# Patient Record
Sex: Female | Born: 1950
Health system: Southern US, Community
[De-identification: ages and names within clinical notes are randomized; demographics above are authoritative.]

## PROBLEM LIST (undated history)

## (undated) DIAGNOSIS — J61 Pneumoconiosis due to asbestos and other mineral fibers: Secondary | ICD-10-CM

## (undated) DIAGNOSIS — M069 Rheumatoid arthritis, unspecified: Secondary | ICD-10-CM

## (undated) DIAGNOSIS — Z800 Family history of malignant neoplasm of digestive organs: Secondary | ICD-10-CM

## (undated) DIAGNOSIS — G8929 Other chronic pain: Secondary | ICD-10-CM

## (undated) DIAGNOSIS — Z85118 Personal history of other malignant neoplasm of bronchus and lung: Secondary | ICD-10-CM

## (undated) DIAGNOSIS — I10 Essential (primary) hypertension: Secondary | ICD-10-CM

## (undated) DIAGNOSIS — K219 Gastro-esophageal reflux disease without esophagitis: Secondary | ICD-10-CM

## (undated) DIAGNOSIS — F32A Depression, unspecified: Secondary | ICD-10-CM

## (undated) DIAGNOSIS — Z803 Family history of malignant neoplasm of breast: Secondary | ICD-10-CM

## (undated) DIAGNOSIS — M1990 Unspecified osteoarthritis, unspecified site: Secondary | ICD-10-CM

## (undated) DIAGNOSIS — F329 Major depressive disorder, single episode, unspecified: Secondary | ICD-10-CM

## (undated) HISTORY — PX: TONSILLECTOMY: SUR1361

## (undated) HISTORY — PX: ABDOMINAL HYSTERECTOMY: SHX81

## (undated) HISTORY — DX: Family history of malignant neoplasm of digestive organs: Z80.0

## (undated) HISTORY — DX: Rheumatoid arthritis, unspecified: M06.9

## (undated) HISTORY — DX: Unspecified osteoarthritis, unspecified site: M19.90

## (undated) HISTORY — DX: Essential (primary) hypertension: I10

## (undated) HISTORY — DX: Gastro-esophageal reflux disease without esophagitis: K21.9

## (undated) HISTORY — PX: FEMUR FRACTURE SURGERY: SHX633

## (undated) HISTORY — PX: BILATERAL OOPHORECTOMY: SHX1221

## (undated) HISTORY — PX: OTHER SURGICAL HISTORY: SHX169

## (undated) HISTORY — DX: Personal history of other malignant neoplasm of bronchus and lung: Z85.118

## (undated) HISTORY — PX: KNEE ARTHROPLASTY: SHX992

## (undated) HISTORY — DX: Pneumoconiosis due to asbestos and other mineral fibers: J61

## (undated) HISTORY — DX: Other chronic pain: G89.29

## (undated) HISTORY — DX: Family history of malignant neoplasm of breast: Z80.3

---

## 2003-08-07 DIAGNOSIS — I10 Essential (primary) hypertension: Secondary | ICD-10-CM | POA: Insufficient documentation

## 2003-08-07 DIAGNOSIS — M810 Age-related osteoporosis without current pathological fracture: Secondary | ICD-10-CM | POA: Insufficient documentation

## 2004-07-01 DIAGNOSIS — M069 Rheumatoid arthritis, unspecified: Secondary | ICD-10-CM | POA: Insufficient documentation

## 2006-03-23 DIAGNOSIS — Z9889 Other specified postprocedural states: Secondary | ICD-10-CM | POA: Insufficient documentation

## 2007-08-12 DIAGNOSIS — R911 Solitary pulmonary nodule: Secondary | ICD-10-CM | POA: Insufficient documentation

## 2007-08-12 DIAGNOSIS — E042 Nontoxic multinodular goiter: Secondary | ICD-10-CM | POA: Insufficient documentation

## 2007-08-12 DIAGNOSIS — N289 Disorder of kidney and ureter, unspecified: Secondary | ICD-10-CM | POA: Insufficient documentation

## 2007-08-12 DIAGNOSIS — E785 Hyperlipidemia, unspecified: Secondary | ICD-10-CM | POA: Insufficient documentation

## 2007-08-12 DIAGNOSIS — R7611 Nonspecific reaction to tuberculin skin test without active tuberculosis: Secondary | ICD-10-CM | POA: Insufficient documentation

## 2007-08-12 DIAGNOSIS — K219 Gastro-esophageal reflux disease without esophagitis: Secondary | ICD-10-CM | POA: Insufficient documentation

## 2009-02-28 DIAGNOSIS — Z961 Presence of intraocular lens: Secondary | ICD-10-CM | POA: Insufficient documentation

## 2010-07-26 DIAGNOSIS — Z90710 Acquired absence of both cervix and uterus: Secondary | ICD-10-CM | POA: Insufficient documentation

## 2010-09-18 DIAGNOSIS — Z87891 Personal history of nicotine dependence: Secondary | ICD-10-CM | POA: Insufficient documentation

## 2010-11-09 DIAGNOSIS — R918 Other nonspecific abnormal finding of lung field: Secondary | ICD-10-CM | POA: Insufficient documentation

## 2011-02-26 DIAGNOSIS — N183 Chronic kidney disease, stage 3 unspecified: Secondary | ICD-10-CM | POA: Insufficient documentation

## 2011-03-12 DIAGNOSIS — F339 Major depressive disorder, recurrent, unspecified: Secondary | ICD-10-CM | POA: Insufficient documentation

## 2012-02-04 DIAGNOSIS — R079 Chest pain, unspecified: Secondary | ICD-10-CM | POA: Insufficient documentation

## 2012-02-04 DIAGNOSIS — J449 Chronic obstructive pulmonary disease, unspecified: Secondary | ICD-10-CM | POA: Insufficient documentation

## 2014-02-09 DIAGNOSIS — Z79899 Other long term (current) drug therapy: Secondary | ICD-10-CM | POA: Diagnosis not present

## 2014-03-02 DIAGNOSIS — M170 Bilateral primary osteoarthritis of knee: Secondary | ICD-10-CM | POA: Diagnosis not present

## 2014-03-02 DIAGNOSIS — M25512 Pain in left shoulder: Secondary | ICD-10-CM | POA: Diagnosis not present

## 2014-03-02 DIAGNOSIS — M0579 Rheumatoid arthritis with rheumatoid factor of multiple sites without organ or systems involvement: Secondary | ICD-10-CM | POA: Diagnosis not present

## 2014-03-02 DIAGNOSIS — Z79899 Other long term (current) drug therapy: Secondary | ICD-10-CM | POA: Diagnosis not present

## 2014-04-20 DIAGNOSIS — Z6833 Body mass index (BMI) 33.0-33.9, adult: Secondary | ICD-10-CM | POA: Diagnosis not present

## 2014-04-20 DIAGNOSIS — E6609 Other obesity due to excess calories: Secondary | ICD-10-CM | POA: Diagnosis not present

## 2014-04-20 DIAGNOSIS — M069 Rheumatoid arthritis, unspecified: Secondary | ICD-10-CM | POA: Diagnosis not present

## 2014-04-28 DIAGNOSIS — Z79899 Other long term (current) drug therapy: Secondary | ICD-10-CM | POA: Diagnosis not present

## 2014-04-28 DIAGNOSIS — M0579 Rheumatoid arthritis with rheumatoid factor of multiple sites without organ or systems involvement: Secondary | ICD-10-CM | POA: Diagnosis not present

## 2014-05-11 DIAGNOSIS — E663 Overweight: Secondary | ICD-10-CM | POA: Diagnosis not present

## 2014-05-11 DIAGNOSIS — I10 Essential (primary) hypertension: Secondary | ICD-10-CM | POA: Diagnosis not present

## 2014-05-11 DIAGNOSIS — Z6828 Body mass index (BMI) 28.0-28.9, adult: Secondary | ICD-10-CM | POA: Diagnosis not present

## 2014-07-19 DIAGNOSIS — Z1389 Encounter for screening for other disorder: Secondary | ICD-10-CM | POA: Diagnosis not present

## 2014-07-19 DIAGNOSIS — Z79899 Other long term (current) drug therapy: Secondary | ICD-10-CM | POA: Diagnosis not present

## 2014-07-19 DIAGNOSIS — E663 Overweight: Secondary | ICD-10-CM | POA: Diagnosis not present

## 2014-07-19 DIAGNOSIS — M170 Bilateral primary osteoarthritis of knee: Secondary | ICD-10-CM | POA: Diagnosis not present

## 2014-07-19 DIAGNOSIS — M0579 Rheumatoid arthritis with rheumatoid factor of multiple sites without organ or systems involvement: Secondary | ICD-10-CM | POA: Diagnosis not present

## 2014-07-19 DIAGNOSIS — L850 Acquired ichthyosis: Secondary | ICD-10-CM | POA: Diagnosis not present

## 2014-07-19 DIAGNOSIS — Z6827 Body mass index (BMI) 27.0-27.9, adult: Secondary | ICD-10-CM | POA: Diagnosis not present

## 2014-07-19 DIAGNOSIS — I10 Essential (primary) hypertension: Secondary | ICD-10-CM | POA: Diagnosis not present

## 2014-07-19 DIAGNOSIS — M069 Rheumatoid arthritis, unspecified: Secondary | ICD-10-CM | POA: Diagnosis not present

## 2014-08-03 DIAGNOSIS — M069 Rheumatoid arthritis, unspecified: Secondary | ICD-10-CM | POA: Diagnosis not present

## 2014-08-03 DIAGNOSIS — M25561 Pain in right knee: Secondary | ICD-10-CM | POA: Diagnosis not present

## 2014-08-03 DIAGNOSIS — Z09 Encounter for follow-up examination after completed treatment for conditions other than malignant neoplasm: Secondary | ICD-10-CM | POA: Diagnosis not present

## 2014-08-03 DIAGNOSIS — M25512 Pain in left shoulder: Secondary | ICD-10-CM | POA: Diagnosis not present

## 2014-09-07 DIAGNOSIS — Z6828 Body mass index (BMI) 28.0-28.9, adult: Secondary | ICD-10-CM | POA: Diagnosis not present

## 2014-09-07 DIAGNOSIS — Z1389 Encounter for screening for other disorder: Secondary | ICD-10-CM | POA: Diagnosis not present

## 2014-09-07 DIAGNOSIS — M1612 Unilateral primary osteoarthritis, left hip: Secondary | ICD-10-CM | POA: Diagnosis not present

## 2014-09-07 DIAGNOSIS — F1120 Opioid dependence, uncomplicated: Secondary | ICD-10-CM | POA: Diagnosis not present

## 2014-09-26 DIAGNOSIS — H5210 Myopia, unspecified eye: Secondary | ICD-10-CM | POA: Diagnosis not present

## 2014-09-26 DIAGNOSIS — H5213 Myopia, bilateral: Secondary | ICD-10-CM | POA: Diagnosis not present

## 2014-11-07 DIAGNOSIS — H40013 Open angle with borderline findings, low risk, bilateral: Secondary | ICD-10-CM | POA: Diagnosis not present

## 2014-11-10 DIAGNOSIS — Z23 Encounter for immunization: Secondary | ICD-10-CM | POA: Diagnosis not present

## 2014-12-13 DIAGNOSIS — M25512 Pain in left shoulder: Secondary | ICD-10-CM | POA: Diagnosis not present

## 2014-12-13 DIAGNOSIS — E663 Overweight: Secondary | ICD-10-CM | POA: Diagnosis not present

## 2014-12-13 DIAGNOSIS — J3089 Other allergic rhinitis: Secondary | ICD-10-CM | POA: Diagnosis not present

## 2014-12-13 DIAGNOSIS — Z1389 Encounter for screening for other disorder: Secondary | ICD-10-CM | POA: Diagnosis not present

## 2014-12-13 DIAGNOSIS — Z09 Encounter for follow-up examination after completed treatment for conditions other than malignant neoplasm: Secondary | ICD-10-CM | POA: Diagnosis not present

## 2014-12-13 DIAGNOSIS — M25562 Pain in left knee: Secondary | ICD-10-CM | POA: Diagnosis not present

## 2014-12-13 DIAGNOSIS — Z6827 Body mass index (BMI) 27.0-27.9, adult: Secondary | ICD-10-CM | POA: Diagnosis not present

## 2014-12-13 DIAGNOSIS — Z79899 Other long term (current) drug therapy: Secondary | ICD-10-CM | POA: Diagnosis not present

## 2014-12-13 DIAGNOSIS — M069 Rheumatoid arthritis, unspecified: Secondary | ICD-10-CM | POA: Diagnosis not present

## 2015-01-03 ENCOUNTER — Other Ambulatory Visit: Payer: Self-pay | Admitting: Periodontics

## 2015-01-08 ENCOUNTER — Other Ambulatory Visit: Payer: Self-pay | Admitting: Family Medicine

## 2015-01-08 DIAGNOSIS — R5381 Other malaise: Secondary | ICD-10-CM

## 2015-01-08 DIAGNOSIS — Z1231 Encounter for screening mammogram for malignant neoplasm of breast: Secondary | ICD-10-CM

## 2015-01-29 DIAGNOSIS — Z1389 Encounter for screening for other disorder: Secondary | ICD-10-CM | POA: Diagnosis not present

## 2015-01-29 DIAGNOSIS — I10 Essential (primary) hypertension: Secondary | ICD-10-CM | POA: Diagnosis not present

## 2015-01-29 DIAGNOSIS — E663 Overweight: Secondary | ICD-10-CM | POA: Diagnosis not present

## 2015-01-29 DIAGNOSIS — Z6827 Body mass index (BMI) 27.0-27.9, adult: Secondary | ICD-10-CM | POA: Diagnosis not present

## 2015-01-29 DIAGNOSIS — R42 Dizziness and giddiness: Secondary | ICD-10-CM | POA: Diagnosis not present

## 2015-02-02 DIAGNOSIS — M1712 Unilateral primary osteoarthritis, left knee: Secondary | ICD-10-CM | POA: Diagnosis not present

## 2015-02-02 DIAGNOSIS — Z79899 Other long term (current) drug therapy: Secondary | ICD-10-CM | POA: Diagnosis not present

## 2015-02-02 DIAGNOSIS — M0579 Rheumatoid arthritis with rheumatoid factor of multiple sites without organ or systems involvement: Secondary | ICD-10-CM | POA: Diagnosis not present

## 2015-02-02 DIAGNOSIS — M19041 Primary osteoarthritis, right hand: Secondary | ICD-10-CM | POA: Diagnosis not present

## 2015-02-02 DIAGNOSIS — M170 Bilateral primary osteoarthritis of knee: Secondary | ICD-10-CM | POA: Diagnosis not present

## 2015-02-05 DIAGNOSIS — Z79899 Other long term (current) drug therapy: Secondary | ICD-10-CM | POA: Diagnosis not present

## 2015-02-05 DIAGNOSIS — E559 Vitamin D deficiency, unspecified: Secondary | ICD-10-CM | POA: Diagnosis not present

## 2015-02-05 DIAGNOSIS — R5383 Other fatigue: Secondary | ICD-10-CM | POA: Diagnosis not present

## 2015-02-05 DIAGNOSIS — R42 Dizziness and giddiness: Secondary | ICD-10-CM | POA: Insufficient documentation

## 2015-02-05 DIAGNOSIS — I10 Essential (primary) hypertension: Secondary | ICD-10-CM

## 2015-02-06 ENCOUNTER — Encounter: Payer: Self-pay | Admitting: Cardiology

## 2015-02-06 ENCOUNTER — Ambulatory Visit (INDEPENDENT_AMBULATORY_CARE_PROVIDER_SITE_OTHER): Payer: Commercial Managed Care - HMO | Admitting: Cardiology

## 2015-02-06 VITALS — BP 124/86 | HR 81 | Ht 63.0 in | Wt 195.0 lb

## 2015-02-06 DIAGNOSIS — R0602 Shortness of breath: Secondary | ICD-10-CM

## 2015-02-06 DIAGNOSIS — R42 Dizziness and giddiness: Secondary | ICD-10-CM | POA: Diagnosis not present

## 2015-02-06 DIAGNOSIS — I10 Essential (primary) hypertension: Secondary | ICD-10-CM | POA: Diagnosis not present

## 2015-02-06 DIAGNOSIS — M069 Rheumatoid arthritis, unspecified: Secondary | ICD-10-CM | POA: Diagnosis not present

## 2015-02-06 MED ORDER — LOSARTAN POTASSIUM-HCTZ 50-12.5 MG PO TABS: 1.0000 | ORAL_TABLET | Freq: Every day | ORAL | Status: DC

## 2015-02-06 NOTE — Progress Notes (Signed)
Cardiology Office Note  Date: 02/06/2015   ID: Mardee Postin, DOB [birth year], MRN [id number]  PCP: Purvis Kilts, MD  Consulting Cardiologist: Rozann Lesches, MD   Chief Complaint  Patient presents with  . Evaluate heart function  . Dizziness    History of Present Illness: Rose Lutz is a 65 y.o. female referred for cardiology consultation by Mr. Eston Mould at [facility]. She presents with a history of predominantly orthostatic lightheadedness that has been present intermittently since late December. She states that she usually notices this after she has been standing up for a few minutes, does not describe vertigo or palpitations, has not had frank syncope. She does not recall any change in her diet, fluid intake, or sodium intake. She does states that she has lost about 35 pounds over the last year. Also reports change in Cozaar to Hyzaar about 6 months ago.  She also describes a feeling of general fatigue, no exertional chest pain. She also reports NYHA class II dyspnea. She moved to [place] from [facility] a few years ago. She states that when she lived in [place] she did undergo a cardiac evaluation approximately 3 or 4 years ago with normal stress testing. ECG today shows sinus rhythm with lead artifact, low voltage in the precordial leads, and IVCD.  She reports no major difficulties in controlling her blood pressure over time on medical therapy. Additional medical history reviewed, she has been managed for long term rheumatoid arthritis with mainly joint manifestations. She had her right knee replaced years ago. She also reports a history of "mild" asbestosis and it sounds like she was followed by a pulmonologist at one point in [place]. She states that this is related to prior occupational exposure. I do not have any other details.  She reports chronic pain related to her arthritis and is on hydrocodone, followed by her primary care provider.  She  states that she has had a recent part-time job at [employer], but has taken herself out of work for a limited time due to her recent symptoms.  Past Medical History  Diagnosis Date  . GERD (gastroesophageal reflux disease)   . Essential hypertension   . Rheumatoid arthritis ([place])   . Pulmonary asbestosis [facility])     Patient states mild  . Chronic pain     On hydrocodone    Past Surgical History  Procedure Laterality Date  . Tonsillectomy    . Knee arthroplasty Right   . Abdominal hysterectomy    . Bilateral oophorectomy      Current Outpatient Prescriptions  Medication Sig Dispense Refill  . cetirizine (ZYRTEC) 10 MG tablet Take 10 mg by mouth at bedtime.    . Cholecalciferol (VITAMIN D3) 3000 units TABS Take 5,000 Units by mouth daily.    . folic acid (FOLVITE) 1 MG tablet Take 1 mg by mouth daily.    Marland Kitchen HYDROcodone-acetaminophen (NORCO) 10-325 MG tablet Take 1 tablet by mouth every 6 (six) hours as needed.    . hydroxychloroquine (PLAQUENIL) 200 MG tablet Take by mouth daily.    . methotrexate (RHEUMATREX) 2.5 MG tablet Take 2.5 mg by mouth once a week. Caution:Chemotherapy. Protect from light.    . montelukast (SINGULAIR) 10 MG tablet     . omeprazole (PRILOSEC) 20 MG capsule Take 20 mg by mouth daily.    Marland Kitchen losartan-hydrochlorothiazide (HYZAAR) 50-12.5 MG tablet Take 1 tablet by mouth daily. 90 tablet 3   No current facility-administered medications for this visit.  Allergies:  Quinine derivatives and Sulfa antibiotics   Social History: The patient  reports that she quit smoking about 9 years ago. She started smoking about 47 years ago. She does not have any smokeless tobacco history on file. She reports that she does not drink alcohol or use illicit drugs.   Family History: The patient's family history includes Breast cancer in her mother; Colon cancer in her maternal grandmother; Pancreatic cancer in her son.   ROS:  Please see the history of present illness. Otherwise,  complete review of systems is positive for diffuse arthritic pains in her hands, knees, and ankles. No exertional chest pain, no palpitations or syncope. All other systems are reviewed and negative.   Physical Exam: VS:  BP 124/86 mmHg  Pulse 81  Ht '5\' 3"'$  (1.6 m)  Wt 195 lb (88.451 kg)  BMI 34.55 kg/m2  SpO2 94%, BMI Body mass index is 34.55 kg/(m^2).  Wt Readings from Last 3 Encounters:  02/06/15 195 lb (88.451 kg)    General: Overweight woman, appears comfortable at rest. HEENT: Conjunctiva and lids normal, oropharynx clear. Neck: Supple, no elevated JVP or carotid bruits, no thyromegaly. Lungs: Clear to auscultation, nonlabored breathing at rest. Cardiac: Regular rate and rhythm, no S3 or significant systolic murmur, no pericardial rub. Abdomen: Soft, nontender, bowel sounds present, no guarding or rebound. Extremities: No pitting edema, distal pulses 2+. Skin: Warm and dry. Musculoskeletal: No kyphosis. Joint deformities consistent with rheumatoid arthritis. Neuropsychiatric: Alert and oriented x3, affect grossly appropriate.  ECG: ECG is ordered today.  Assessment and Plan:  1. Description of orthostatic lightheadedness, no palpitations or syncope. ECG reviewed and nonspecific. Orthostatic measurements were made today. She described lightheadedness after standing, systolic blood pressure decreased from 124 down to 102 from supine position to standing, but heart rate remained in the 80s. Although technically nondiagnostic, this would suggest that perhaps allowing her blood pressure to come up some might alleviate some of her symptoms of lightheadedness. She did get changed from Cozaar to Hyzaar 6 months ago and it could be that the additional diuretic is contributing. For now I have asked her to cut her Hyzaar dose in half and then plan to follow-up with her primary care provider for further adjustments. Does not sound like a cardiac monitor would necessarily be of much benefit  without any definite palpitations and a relation to standing. She describes mild shortness of breath, we will also obtain an echocardiogram to assess basic cardiac structure and function.  2. History of essential hypertension, reportedly without significant difficulty controlling blood pressure over time. Hyzaar is being cut back as outlined above.  3. Long-standing rheumatoid arthritis. She is on Plaquenil and methotrexate, followed by a rheumatologist. Also on hydrocodone for pain control, followed by her primary care provider.  Current medicines were reviewed with the patient today.   Orders Placed This Encounter  Procedures  . EKG 12-Lead  . Echocardiogram    Disposition: Call with results.   Signed, Satira Sark, MD, [facility] 02/06/2015 10:00 AM    [facility] at [facility] [address], [place], [place] [address] Phone: [phone]; Fax: [phone]

## 2015-02-06 NOTE — Patient Instructions (Signed)
Medication Instructions:  DECREASE HYZAAR 50-12.5 MG DAILY   Labwork: NONE  Testing/Procedures: Your physician has requested that you have an echocardiogram. Echocardiography is a painless test that uses sound waves to create images of your heart. It provides your doctor with information about the size and shape of your heart and how well your heart's chambers and valves are working. This procedure takes approximately one hour. There are no restrictions for this procedure.     Follow-Up: Your physician recommends that you schedule a follow-up appointment in: FOLLOW UP WITH YOUR PRIMARY CARE PROVIDER IN 1 MONTH * WE WILL CALL YOU WITH THE RESULTS TO YOUR ECHOCARDIOGRAM *   Any Other Special Instructions Will Be Listed Below (If Applicable).     If you need a refill on your cardiac medications before your next appointment, please call your pharmacy.

## 2015-02-07 ENCOUNTER — Ambulatory Visit: Payer: Self-pay | Admitting: Cardiology

## 2015-02-13 ENCOUNTER — Ambulatory Visit (HOSPITAL_COMMUNITY)
Admission: RE | Admit: 2015-02-13 | Discharge: 2015-02-13 | Disposition: A | Discharge status: Home / Self Care | Payer: Commercial Managed Care - HMO | Source: Ambulatory Visit | Attending: Cardiology | Admitting: Cardiology

## 2015-02-13 DIAGNOSIS — I10 Essential (primary) hypertension: Secondary | ICD-10-CM | POA: Insufficient documentation

## 2015-02-13 DIAGNOSIS — R0602 Shortness of breath: Secondary | ICD-10-CM | POA: Diagnosis not present

## 2015-02-22 DIAGNOSIS — M069 Rheumatoid arthritis, unspecified: Secondary | ICD-10-CM | POA: Diagnosis not present

## 2015-02-22 DIAGNOSIS — J61 Pneumoconiosis due to asbestos and other mineral fibers: Secondary | ICD-10-CM | POA: Diagnosis not present

## 2015-02-22 DIAGNOSIS — I10 Essential (primary) hypertension: Secondary | ICD-10-CM | POA: Diagnosis not present

## 2015-02-22 DIAGNOSIS — Z1389 Encounter for screening for other disorder: Secondary | ICD-10-CM | POA: Diagnosis not present

## 2015-02-22 DIAGNOSIS — Z6827 Body mass index (BMI) 27.0-27.9, adult: Secondary | ICD-10-CM | POA: Diagnosis not present

## 2015-04-03 DIAGNOSIS — H40013 Open angle with borderline findings, low risk, bilateral: Secondary | ICD-10-CM | POA: Diagnosis not present

## 2015-05-01 ENCOUNTER — Other Ambulatory Visit (HOSPITAL_COMMUNITY): Payer: Self-pay | Admitting: Family Medicine

## 2015-05-01 DIAGNOSIS — Z1231 Encounter for screening mammogram for malignant neoplasm of breast: Secondary | ICD-10-CM

## 2015-05-09 ENCOUNTER — Ambulatory Visit (HOSPITAL_COMMUNITY)
Admission: RE | Admit: 2015-05-09 | Discharge: 2015-05-09 | Disposition: A | Discharge status: Home / Self Care | Payer: Commercial Managed Care - HMO | Source: Ambulatory Visit | Attending: Family Medicine | Admitting: Family Medicine

## 2015-05-09 DIAGNOSIS — Z1231 Encounter for screening mammogram for malignant neoplasm of breast: Secondary | ICD-10-CM | POA: Diagnosis not present

## 2015-05-09 DIAGNOSIS — Z79899 Other long term (current) drug therapy: Secondary | ICD-10-CM | POA: Diagnosis not present

## 2015-06-21 DIAGNOSIS — Z6827 Body mass index (BMI) 27.0-27.9, adult: Secondary | ICD-10-CM | POA: Diagnosis not present

## 2015-06-21 DIAGNOSIS — M069 Rheumatoid arthritis, unspecified: Secondary | ICD-10-CM | POA: Diagnosis not present

## 2015-06-21 DIAGNOSIS — Z1389 Encounter for screening for other disorder: Secondary | ICD-10-CM | POA: Diagnosis not present

## 2015-07-05 DIAGNOSIS — M1712 Unilateral primary osteoarthritis, left knee: Secondary | ICD-10-CM | POA: Diagnosis not present

## 2015-07-05 DIAGNOSIS — M19041 Primary osteoarthritis, right hand: Secondary | ICD-10-CM | POA: Diagnosis not present

## 2015-07-05 DIAGNOSIS — Z79899 Other long term (current) drug therapy: Secondary | ICD-10-CM | POA: Diagnosis not present

## 2015-07-05 DIAGNOSIS — M0579 Rheumatoid arthritis with rheumatoid factor of multiple sites without organ or systems involvement: Secondary | ICD-10-CM | POA: Diagnosis not present

## 2015-07-10 DIAGNOSIS — Z79899 Other long term (current) drug therapy: Secondary | ICD-10-CM | POA: Diagnosis not present

## 2015-07-10 DIAGNOSIS — M1712 Unilateral primary osteoarthritis, left knee: Secondary | ICD-10-CM | POA: Diagnosis not present

## 2015-07-10 DIAGNOSIS — M0579 Rheumatoid arthritis with rheumatoid factor of multiple sites without organ or systems involvement: Secondary | ICD-10-CM | POA: Diagnosis not present

## 2015-07-10 DIAGNOSIS — M19041 Primary osteoarthritis, right hand: Secondary | ICD-10-CM | POA: Diagnosis not present

## 2015-08-30 DIAGNOSIS — Z79899 Other long term (current) drug therapy: Secondary | ICD-10-CM | POA: Diagnosis not present

## 2015-09-20 DIAGNOSIS — Z1389 Encounter for screening for other disorder: Secondary | ICD-10-CM | POA: Diagnosis not present

## 2015-09-20 DIAGNOSIS — Z6827 Body mass index (BMI) 27.0-27.9, adult: Secondary | ICD-10-CM | POA: Diagnosis not present

## 2015-09-20 DIAGNOSIS — F11282 Opioid dependence with opioid-induced sleep disorder: Secondary | ICD-10-CM | POA: Diagnosis not present

## 2015-09-20 DIAGNOSIS — M069 Rheumatoid arthritis, unspecified: Secondary | ICD-10-CM | POA: Diagnosis not present

## 2015-09-20 DIAGNOSIS — I10 Essential (primary) hypertension: Secondary | ICD-10-CM | POA: Diagnosis not present

## 2015-09-21 DIAGNOSIS — M1712 Unilateral primary osteoarthritis, left knee: Secondary | ICD-10-CM | POA: Diagnosis not present

## 2015-09-28 DIAGNOSIS — M1712 Unilateral primary osteoarthritis, left knee: Secondary | ICD-10-CM | POA: Diagnosis not present

## 2015-10-05 DIAGNOSIS — M1712 Unilateral primary osteoarthritis, left knee: Secondary | ICD-10-CM | POA: Diagnosis not present

## 2015-10-23 DIAGNOSIS — E669 Obesity, unspecified: Secondary | ICD-10-CM | POA: Diagnosis not present

## 2015-10-23 DIAGNOSIS — Z6833 Body mass index (BMI) 33.0-33.9, adult: Secondary | ICD-10-CM | POA: Diagnosis not present

## 2015-10-23 DIAGNOSIS — Z1389 Encounter for screening for other disorder: Secondary | ICD-10-CM | POA: Diagnosis not present

## 2015-10-23 DIAGNOSIS — G894 Chronic pain syndrome: Secondary | ICD-10-CM | POA: Diagnosis not present

## 2015-10-23 DIAGNOSIS — I10 Essential (primary) hypertension: Secondary | ICD-10-CM | POA: Diagnosis not present

## 2015-10-23 DIAGNOSIS — H524 Presbyopia: Secondary | ICD-10-CM | POA: Diagnosis not present

## 2015-10-23 DIAGNOSIS — Z79891 Long term (current) use of opiate analgesic: Secondary | ICD-10-CM | POA: Diagnosis not present

## 2015-10-23 DIAGNOSIS — H26493 Other secondary cataract, bilateral: Secondary | ICD-10-CM | POA: Diagnosis not present

## 2015-10-23 DIAGNOSIS — Z961 Presence of intraocular lens: Secondary | ICD-10-CM | POA: Diagnosis not present

## 2015-10-23 DIAGNOSIS — E6609 Other obesity due to excess calories: Secondary | ICD-10-CM | POA: Diagnosis not present

## 2015-10-23 DIAGNOSIS — M069 Rheumatoid arthritis, unspecified: Secondary | ICD-10-CM | POA: Diagnosis not present

## 2015-11-16 DIAGNOSIS — G894 Chronic pain syndrome: Secondary | ICD-10-CM | POA: Diagnosis not present

## 2015-11-16 DIAGNOSIS — Z6833 Body mass index (BMI) 33.0-33.9, adult: Secondary | ICD-10-CM | POA: Diagnosis not present

## 2015-11-16 DIAGNOSIS — E6609 Other obesity due to excess calories: Secondary | ICD-10-CM | POA: Diagnosis not present

## 2015-12-04 ENCOUNTER — Ambulatory Visit (HOSPITAL_COMMUNITY)
Admission: RE | Admit: 2015-12-04 | Discharge: 2015-12-04 | Disposition: A | Discharge status: Home / Self Care | Payer: Commercial Managed Care - HMO | Source: Ambulatory Visit | Attending: Ophthalmology | Admitting: Ophthalmology

## 2015-12-04 ENCOUNTER — Encounter (HOSPITAL_COMMUNITY)
Admission: RE | Disposition: A | Discharge status: Home / Self Care | Payer: Self-pay | Source: Ambulatory Visit | Attending: Ophthalmology

## 2015-12-04 DIAGNOSIS — H2640 Unspecified secondary cataract: Secondary | ICD-10-CM | POA: Diagnosis not present

## 2015-12-04 DIAGNOSIS — Z79899 Other long term (current) drug therapy: Secondary | ICD-10-CM | POA: Diagnosis not present

## 2015-12-04 DIAGNOSIS — H26491 Other secondary cataract, right eye: Secondary | ICD-10-CM | POA: Diagnosis not present

## 2015-12-04 HISTORY — PX: YAG LASER APPLICATION: SHX6189

## 2015-12-04 SURGERY — TREATMENT, USING YAG LASER
Anesthesia: LOCAL | Laterality: Right

## 2015-12-04 MED ORDER — CYCLOPENTOLATE-PHENYLEPHRINE 0.2-1 % OP SOLN
1.0000 [drp] | OPHTHALMIC | Status: AC
  Administered 2015-12-04 (×3): 1 [drp] via OPHTHALMIC

## 2015-12-04 NOTE — H&P (Signed)
The patient was re examined and there is no change in the patients condition since the original H and P. 

## 2015-12-04 NOTE — Progress Notes (Signed)
Office Visit Note  Patient: Rose Lutz             Date of Birth: 02-13-1950           MRN: [id number]             PCP: Purvis Kilts, MD Referring: Sharilyn Sites, MD Visit Date: 12/05/2015 Occupation: '@GUAROCC'$ @    Subjective:  No chief complaint on file. Follow-up on rheumatoid arthritis and high risk prescription  History of Present Illness: Rose Lutz is a 65 y.o. female   She and is doing well with the rheumatoid arthritis overall. She is on methotrexate 4 per week folic acid 1 per day. She is on Plaquenil 200 mg in the morning and 100 mg at night every day. Her Plaquenil eye exam was normal September 2016 and she is due for another one September November December 2017 she did get it done and it was negative.  One of her main complaints today is that her right toes are starting to cramp up since last night. Her left foot on the top part of her foot has been cramping up for the last few weeks. Her issue years ago but she's been doing well until recently.  Currently the weather has gotten very cold in [place] and she is having discomfort in some of her joints as a result of the change in weather. This is a typical experience for the patient. No associated swelling warmth to those hand joints and wrist joints.  Her main complaint as noted is the cramping beginning in her feet as described above.  She also has some left knee joint pain off and on. Note that she has OA/RA overlap.   Activities of Daily Living:  Patient reports morning stiffness for 15 minutes.   Patient Reports nocturnal pain.  Difficulty dressing/grooming: Reports Difficulty climbing stairs: Reports Difficulty getting out of chair: Reports Difficulty using hands for taps, buttons, cutlery, and/or writing: Reports   Review of Systems  Constitutional: Negative for fatigue.  HENT: Negative for mouth sores and mouth dryness.   Eyes: Negative for dryness.  Respiratory: Negative for  shortness of breath.   Gastrointestinal: Negative for constipation and diarrhea.  Musculoskeletal: Negative for myalgias and myalgias.  Skin: Negative for sensitivity to sunlight.  Psychiatric/Behavioral: Negative for decreased concentration and sleep disturbance.    PMFS History:  Patient Active Problem List   Diagnosis Date Noted  . Dizziness and giddiness 02/05/2015  . HTN (hypertension) 02/05/2015    Past Medical History:  Diagnosis Date  . Asbestos exposure    Spot on lung  . Chronic pain    On hydrocodone  . Essential hypertension   . GERD (gastroesophageal reflux disease)   . Pulmonary asbestosis [facility])    Patient states mild  . Rheumatoid arthritis ([place])     Family History  Problem Relation Age of Onset  . Breast cancer Mother   . Colon cancer Maternal Grandmother   . Pancreatic cancer Son    Past Surgical History:  Procedure Laterality Date  . ABDOMINAL HYSTERECTOMY    . BILATERAL OOPHORECTOMY    . CESAREAN SECTION     x3  . KNEE ARTHROPLASTY Right   . TONSILLECTOMY     Social History   Social History Narrative  . No narrative on file     Objective: Vital Signs: BP 139/88 (BP Location: Right Arm, Patient Position: Sitting, Cuff Size: Large)   Pulse 78   Resp 14  Ht 5' 4.25" (1.632 m)   Wt 202 lb (91.6 kg)   BMI 34.40 kg/m    Physical Exam  Constitutional: She is oriented to person, place, and time. She appears well-developed and well-nourished.  HENT:  Head: Normocephalic and atraumatic.  Eyes: EOM are normal. Pupils are equal, round, and reactive to light.  Cardiovascular: Normal rate, regular rhythm and normal heart sounds.  Exam reveals no gallop and no friction rub.   No murmur heard. Pulmonary/Chest: Effort normal and breath sounds normal. She has no wheezes. She has no rales.  Abdominal: Soft. Bowel sounds are normal. She exhibits no distension. There is no tenderness. There is no guarding. No hernia.  Musculoskeletal: Normal range of  motion. She exhibits no edema, tenderness or deformity.  Lymphadenopathy:    She has no cervical adenopathy.  Neurological: She is alert and oriented to person, place, and time. Coordination normal.  Skin: Skin is warm and dry. Capillary refill takes less than 2 seconds. No rash noted.  Psychiatric: She has a normal mood and affect. Her behavior is normal.  Nursing note and vitals reviewed.    Musculoskeletal Exam:   Full range of motion of right shoulder joint but only 45 of abduction of the left shoulder joint. Grip strength is equal and strong bilaterally For myalgia tender points are all absent Mild ulnar deviation noted to both hands. Left wrist joint has some subluxation.  CDAI Exam: CDAI Homunculus Exam:   Joint Counts:  CDAI Tender Joint count: 0 CDAI Swollen Joint count: 0  Global Assessments:  Patient Global Assessment: 5 Provider Global Assessment: 5  CDAI Calculated Score: 10    Investigation: No additional findings.  Patient's last labs were done 08/30/2015 which showed CBC with differential normal CMP with GFR normal.  She is due for labs today. Ordered. Imaging: No results found.  Speciality Comments: No specialty comments available.    Procedures:  No procedures performed Allergies: Quinine derivatives; Sulfa antibiotics; and Percocet [oxycodone-acetaminophen]   Assessment / Plan: Visit Diagnoses: Rheumatoid arthritis with rheumatoid factor of multiple sites without organ or systems involvement ([place])  High risk medications (not anticoagulants) long-term use - Plan: CBC with Differential/Platelet, COMPLETE METABOLIC PANEL WITH GFR, VITAMIN D 25 Hydroxy (Vit-D Deficiency, Fractures)  Osteoarthritis of both hands, unspecified osteoarthritis type  Osteoarthritis of left knee, unspecified osteoarthritis type  Pain in left ankle and joints of left foot - Plan: XR Foot 2 Views Left  Other fatigue - Plan: VITAMIN D 25 Hydroxy (Vit-D Deficiency,  Fractures)   She is having cramps in her right toe and the dorsal part of her left foot. This is been going on for a week or 2 on the left foot and since last night on the right foot. I've offered the patient to get magnesium malate, manganese, calcium. Left dorsal foot looks like it has a dorsal spur. 2 Deveshwar talked about wearing proper fitting shoes.  I'm giving her a sample of Pennsaid 2%. She can rub half of the packet on her dorsal left foot twice a day. Her 16 packets to use. Lot number [id number]; expiration date September 2018.  I'm also been x-rayed her left foot just to be sure there are no other bony abnormalities or other arrangements.  I'm going to do CBC with differential CMP with GFR today in the office  I'll refill her methotrexate folic acid Plaquenil  Patient received injection in her left knee in the recent past and she states that it has given  her about 60% improvement.  I've encouraged the patient to get inserts for both of her feet from shoe market on [address]. She will take the shoes as well as other shoes into their showroom and let them know to make sure that the orthotics that they give her fits properly in all of the shoes that she normally wears at work.  Patient will go to shoe market on [address] for orthotics. She does not need to buy any new footwear. She knows that that would be quite expensive getting it from the shoe market. She only will ask for inserts at the shoe market which costs approximately $50  Return to clinic in 5 months  Orders: Orders Placed This Encounter  Procedures  . XR Foot 2 Views Left  . CBC with Differential/Platelet  . COMPLETE METABOLIC PANEL WITH GFR  . VITAMIN D 25 Hydroxy (Vit-D Deficiency, Fractures)   Meds ordered this encounter  Medications  . folic acid (FOLVITE) 1 MG tablet    Sig: Take 1 tablet (1 mg total) by mouth daily.    Dispense:  90 tablet    Refill:  4    Order Specific Question:   Supervising  Provider    Answer:   Bo Merino [[id number]]  . methotrexate (RHEUMATREX) 2.5 MG tablet    Sig: Take 4 tablets (10 mg total) by mouth once a week. Caution:Chemotherapy. Protect from light. Tuesdays    Dispense:  48 tablet    Refill:  0    Order Specific Question:   Supervising Provider    Answer:   Bo Merino [[id number]]  . hydroxychloroquine (PLAQUENIL) 200 MG tablet    Sig: 200 mg in the morning and 100 mg at night for total of 300 mg daily every day of the week.    Dispense:  135 tablet    Refill:  1    Order Specific Question:   Supervising Provider    Answer:   Bo Merino [phone]    Face-to-face time spent with patient was 40 minutes. 50% of time was spent in counseling and coordination of care.  Follow-Up Instructions: Return for RA, HRRX, left dorsal foot pain, left knee pain.   Eliezer Lofts, PA-C I examined and evaluated the patient with Eliezer Lofts PA. The plan of care was discussed as noted above.  Bo Merino, MD

## 2015-12-04 NOTE — Op Note (Signed)
Macsen Nuttall T. Gershon Crane, MD  Procedure: Yag Capsulotomy  Yag Laser Self Test Completedyes. Procedure: Posterior Capsulotomy, Eye Protection Worn by Staff yes. Laser In Use Sign on Door yes.  Laser: Nd:YAG Spot Size: Fixed Burst Mode: III Power Setting: 3.4 mJ/burst Number of shots: 22 Total energy delivered: 67.47 mJ   The patient tolerated the procedure without difficulty. No complications were encountered.   The patient was discharged home with the instructions to continue all her current glaucoma medications, if any.   Patient instructed to go to office at 0100 for intraocular pressure check.  Patient verbalizes understanding of discharge instructions Yes.  .    Pre-Operative Diagnosis: After-Cataract, obscuring vision, 366.53 OD Post-Operative Diagnosis: After-Cataract, obscuring vision, 366.53 OD Date of Cataract Surgery: 2010

## 2015-12-04 NOTE — Discharge Instructions (Signed)
Janera D Jaskot  12/04/2015     Instructions    Activity: No Restrictions.   Diet: Resume Diet you were on at home.   Pain Medication: Tylenol if Needed.   CONTACT YOUR DOCTOR IF YOU HAVE PAIN, REDNESS IN YOUR EYE, OR DECREASED VISION.   Follow-up:{follow up:32580} with Rutherford Guys, MD.   Dr. Gershon Crane: [phone]  Dr. Iona Hansen: [phone]  Dr. Geoffry Paradise: [phone]   If you find that you cannot contact your physician, but feel that your signs and   Symptoms warrant a physician's attention, call the Emergency Room at   [phone] [phone].   Other{NA AND ASTMHDQQ:22979}.

## 2015-12-05 ENCOUNTER — Ambulatory Visit (INDEPENDENT_AMBULATORY_CARE_PROVIDER_SITE_OTHER): Payer: Commercial Managed Care - HMO

## 2015-12-05 ENCOUNTER — Ambulatory Visit (INDEPENDENT_AMBULATORY_CARE_PROVIDER_SITE_OTHER): Payer: Commercial Managed Care - HMO | Admitting: Rheumatology

## 2015-12-05 ENCOUNTER — Encounter: Payer: Self-pay | Admitting: Rheumatology

## 2015-12-05 VITALS — BP 139/88 | HR 78 | Resp 14 | Ht 64.25 in | Wt 202.0 lb

## 2015-12-05 DIAGNOSIS — M25572 Pain in left ankle and joints of left foot: Secondary | ICD-10-CM | POA: Diagnosis not present

## 2015-12-05 DIAGNOSIS — M0579 Rheumatoid arthritis with rheumatoid factor of multiple sites without organ or systems involvement: Secondary | ICD-10-CM | POA: Diagnosis not present

## 2015-12-05 DIAGNOSIS — Z79899 Other long term (current) drug therapy: Secondary | ICD-10-CM | POA: Diagnosis not present

## 2015-12-05 DIAGNOSIS — M19041 Primary osteoarthritis, right hand: Secondary | ICD-10-CM | POA: Diagnosis not present

## 2015-12-05 DIAGNOSIS — R5383 Other fatigue: Secondary | ICD-10-CM

## 2015-12-05 DIAGNOSIS — E559 Vitamin D deficiency, unspecified: Secondary | ICD-10-CM | POA: Diagnosis not present

## 2015-12-05 DIAGNOSIS — M1712 Unilateral primary osteoarthritis, left knee: Secondary | ICD-10-CM

## 2015-12-05 DIAGNOSIS — M19042 Primary osteoarthritis, left hand: Secondary | ICD-10-CM | POA: Diagnosis not present

## 2015-12-05 LAB — COMPLETE METABOLIC PANEL WITH GFR
ALBUMIN: 4.1 g/dL (ref 3.6–5.1)
ALK PHOS: 67 U/L (ref 33–130)
ALT: 12 U/L (ref 6–29)
AST: 22 U/L (ref 10–35)
BUN: 14 mg/dL (ref 7–25)
CO2: 26 mmol/L (ref 20–31)
Calcium: 9.4 mg/dL (ref 8.6–10.4)
Chloride: 105 mmol/L (ref 98–110)
Creat: 0.9 mg/dL (ref 0.50–0.99)
GFR, Est African American: 77 mL/min (ref >=60)
GFR, Est Non African American: 67 mL/min (ref >=60)
GLUCOSE: 95 mg/dL (ref 65–99)
POTASSIUM: 4.4 mmol/L (ref 3.5–5.3)
SODIUM: 139 mmol/L (ref 135–146)
Total Bilirubin: 0.4 mg/dL (ref 0.2–1.2)
Total Protein: 6.9 g/dL (ref 6.1–8.1)

## 2015-12-05 LAB — CBC WITH DIFFERENTIAL/PLATELET
Basophils Absolute: 0 cells/uL (ref 0–200)
Basophils Relative: 0 %
EOS PCT: 1 %
Eosinophils Absolute: 80 cells/uL (ref 15–500)
HCT: 37.8 % (ref 35.0–45.0)
HEMOGLOBIN: 12.3 g/dL (ref 11.7–15.5)
LYMPHS ABS: 1600 {cells}/uL (ref 850–3900)
Lymphocytes Relative: 20 %
MCH: 31.2 pg (ref 27.0–33.0)
MCHC: 32.5 g/dL (ref 32.0–36.0)
MCV: 95.9 fL (ref 80.0–100.0)
MONOS PCT: 8 %
MPV: 9.7 fL (ref 7.5–12.5)
Monocytes Absolute: 640 cells/uL (ref 200–950)
NEUTROS ABS: 5680 {cells}/uL (ref 1500–7800)
NEUTROS PCT: 71 %
PLATELETS: 338 10*3/uL (ref 140–400)
RBC: 3.94 MIL/uL (ref 3.80–5.10)
RDW: 15.2 % — ABNORMAL HIGH (ref 11.0–15.0)
WBC: 8 10*3/uL (ref 3.8–10.8)

## 2015-12-05 MED ORDER — HYDROXYCHLOROQUINE SULFATE 200 MG PO TABS: ORAL_TABLET | ORAL | 1 refills | Status: DC

## 2015-12-05 MED ORDER — FOLIC ACID 1 MG PO TABS: 1.0000 mg | ORAL_TABLET | Freq: Every day | ORAL | 4 refills | Status: DC

## 2015-12-05 MED ORDER — METHOTREXATE 2.5 MG PO TABS: 10.0000 mg | ORAL_TABLET | ORAL | 0 refills | Status: AC

## 2015-12-06 ENCOUNTER — Encounter (HOSPITAL_COMMUNITY): Payer: Self-pay | Admitting: Ophthalmology

## 2015-12-06 LAB — VITAMIN D 25 HYDROXY (VIT D DEFICIENCY, FRACTURES): Vit D, 25-Hydroxy: 30 ng/mL (ref 30–100)

## 2015-12-06 NOTE — Progress Notes (Signed)
#  1 (vitamin D is low normal at 30. Advised patient to do 50,000 IUs every week for 12 weeks dispense 12 pills with no refills and do over-the-counter vitamin D 5000 IUs every week#2 (CMP with GFR normal  #3 (CBC with differential normal

## 2015-12-07 ENCOUNTER — Telehealth: Payer: Self-pay | Admitting: *Deleted

## 2015-12-07 DIAGNOSIS — E559 Vitamin D deficiency, unspecified: Secondary | ICD-10-CM

## 2015-12-07 MED ORDER — VITAMIN D (ERGOCALCIFEROL) 1.25 MG (50000 UNIT) PO CAPS: 50000.0000 [IU] | ORAL_CAPSULE | ORAL | 0 refills | Status: AC

## 2015-12-07 NOTE — Telephone Encounter (Signed)
-----   Message from Eliezer Lofts, [place] sent at 12/06/2015  5:23 PM EST ----- #1 (vitamin D is low normal at 30. Advised patient to do 50,000 IUs every week for 12 weeks dispense 12 pills with no refills and do over-the-counter vitamin D 5000 IUs every week#2 (CMP with GFR normal  #3 (CBC with differential normal

## 2015-12-12 ENCOUNTER — Telehealth: Payer: Self-pay | Admitting: Rheumatology

## 2015-12-12 MED ORDER — VITAMIN D3 1.25 MG (50000 UT) PO CAPS: 50000.0000 [IU] | ORAL_CAPSULE | ORAL | 0 refills | Status: AC

## 2015-12-12 NOTE — Telephone Encounter (Signed)
Patient is requesting that the calcium rx be sent to [facility] on [address] in [place]. She thought this had already been sent.  Patient is also wanting to make sure that MTX rx has been submitted to [facility].

## 2015-12-12 NOTE — Telephone Encounter (Signed)
Mr Carlyon Shadow note states (vitamin D is low normal at 30. Advised patient to do 50,000 IUs every week for 12 weeks dispense 12 pills with no refills

## 2015-12-18 ENCOUNTER — Encounter (HOSPITAL_COMMUNITY)
Admission: RE | Disposition: A | Discharge status: Home / Self Care | Payer: Self-pay | Source: Ambulatory Visit | Attending: Ophthalmology

## 2015-12-18 ENCOUNTER — Encounter (HOSPITAL_COMMUNITY): Payer: Self-pay | Admitting: *Deleted

## 2015-12-18 ENCOUNTER — Ambulatory Visit (HOSPITAL_COMMUNITY)
Admission: RE | Admit: 2015-12-18 | Discharge: 2015-12-18 | Disposition: A | Discharge status: Home / Self Care | Payer: Commercial Managed Care - HMO | Source: Ambulatory Visit | Attending: Ophthalmology | Admitting: Ophthalmology

## 2015-12-18 DIAGNOSIS — H2640 Unspecified secondary cataract: Secondary | ICD-10-CM | POA: Diagnosis not present

## 2015-12-18 DIAGNOSIS — Z79899 Other long term (current) drug therapy: Secondary | ICD-10-CM | POA: Diagnosis not present

## 2015-12-18 DIAGNOSIS — H26492 Other secondary cataract, left eye: Secondary | ICD-10-CM | POA: Diagnosis not present

## 2015-12-18 DIAGNOSIS — Z791 Long term (current) use of non-steroidal anti-inflammatories (NSAID): Secondary | ICD-10-CM | POA: Insufficient documentation

## 2015-12-18 HISTORY — PX: YAG LASER APPLICATION: SHX6189

## 2015-12-18 SURGERY — TREATMENT, USING YAG LASER
Anesthesia: LOCAL | Laterality: Left

## 2015-12-18 MED ORDER — TETRACAINE HCL 0.5 % OP SOLN
1.0000 [drp] | Freq: Once | OPHTHALMIC | Status: AC
  Administered 2015-12-18: 1 [drp] via OPHTHALMIC

## 2015-12-18 MED ORDER — TROPICAMIDE 1 % OP SOLN
OPHTHALMIC | Status: AC
Start: 2015-12-18 — End: 2015-12-18
  Filled 2015-12-18: qty 3

## 2015-12-18 MED ORDER — TETRACAINE HCL 0.5 % OP SOLN
OPHTHALMIC | Status: AC
  Filled 2015-12-18: qty 4

## 2015-12-18 MED ORDER — TROPICAMIDE 1 % OP SOLN
1.0000 [drp] | OPHTHALMIC | Status: AC
  Administered 2015-12-18 (×3): 1 [drp] via OPHTHALMIC

## 2015-12-18 NOTE — H&P (Signed)
The patient was re examined and there is no change in the patients condition since the original H and P. 

## 2015-12-18 NOTE — Discharge Instructions (Signed)
Tanicia D College  12/18/2015     Instructions    Activity: No Restrictions.   Diet: Resume Diet you were on at home.   Pain Medication: Tylenol if Needed.   CONTACT YOUR DOCTOR IF YOU HAVE PAIN, REDNESS IN YOUR EYE, OR DECREASED VISION.   Follow-up: with Rutherford Guys, MD.   Dr. Gershon Crane: [phone]     If you find that you cannot contact your physician, but feel that your signs and   Symptoms warrant a physician's attention, call the Emergency Room at   [phone] ext.[phone].

## 2015-12-18 NOTE — Op Note (Signed)
Faustino Luecke T. Gershon Crane, MD  Procedure: Yag Capsulotomy  Yag Laser Self Test Completedyes. Procedure: Posterior Capsulotomy, Eye Protection Worn by Staff yes. Laser In Use Sign on Door yes.  Laser: Nd:YAG OS Spot Size: Fixed Burst Mode: III Power Setting: 3.4 mJ/burst Number of shots: 18 Total energy delivered: 53.48 mJ   The patient tolerated the procedure without difficulty. No complications were encountered.   The patient was discharged home with the instructions to continue all her current glaucoma medications, if any.   Patient instructed to go to office at 0100 for intraocular pressure check.  Patient verbalizes understanding of discharge instructions Yes.  .   Pre-Operative Diagnosis: After-Cataract, obscuring vision, 366.53 OS Post-Operative Diagnosis: After-Cataract, obscuring vision, 366.53 OS Date of Cataract Surgery: 2010

## 2015-12-19 ENCOUNTER — Other Ambulatory Visit: Payer: Self-pay | Admitting: Cardiology

## 2015-12-20 ENCOUNTER — Encounter (HOSPITAL_COMMUNITY): Payer: Self-pay | Admitting: Ophthalmology

## 2016-01-04 DIAGNOSIS — Z1389 Encounter for screening for other disorder: Secondary | ICD-10-CM | POA: Diagnosis not present

## 2016-01-04 DIAGNOSIS — E6609 Other obesity due to excess calories: Secondary | ICD-10-CM | POA: Diagnosis not present

## 2016-01-04 DIAGNOSIS — Z6833 Body mass index (BMI) 33.0-33.9, adult: Secondary | ICD-10-CM | POA: Diagnosis not present

## 2016-01-04 DIAGNOSIS — Z23 Encounter for immunization: Secondary | ICD-10-CM | POA: Diagnosis not present

## 2016-01-04 DIAGNOSIS — M069 Rheumatoid arthritis, unspecified: Secondary | ICD-10-CM | POA: Diagnosis not present

## 2016-01-04 DIAGNOSIS — R008 Other abnormalities of heart beat: Secondary | ICD-10-CM | POA: Diagnosis not present

## 2016-02-19 DIAGNOSIS — J329 Chronic sinusitis, unspecified: Secondary | ICD-10-CM | POA: Diagnosis not present

## 2016-02-19 DIAGNOSIS — J343 Hypertrophy of nasal turbinates: Secondary | ICD-10-CM | POA: Diagnosis not present

## 2016-02-19 DIAGNOSIS — E669 Obesity, unspecified: Secondary | ICD-10-CM | POA: Diagnosis not present

## 2016-02-19 DIAGNOSIS — Z6832 Body mass index (BMI) 32.0-32.9, adult: Secondary | ICD-10-CM | POA: Diagnosis not present

## 2016-02-19 DIAGNOSIS — M791 Myalgia: Secondary | ICD-10-CM | POA: Diagnosis not present

## 2016-02-19 DIAGNOSIS — I10 Essential (primary) hypertension: Secondary | ICD-10-CM | POA: Diagnosis not present

## 2016-02-19 DIAGNOSIS — R070 Pain in throat: Secondary | ICD-10-CM | POA: Diagnosis not present

## 2016-02-19 DIAGNOSIS — E6609 Other obesity due to excess calories: Secondary | ICD-10-CM | POA: Diagnosis not present

## 2016-02-19 DIAGNOSIS — Z1389 Encounter for screening for other disorder: Secondary | ICD-10-CM | POA: Diagnosis not present

## 2016-03-04 ENCOUNTER — Other Ambulatory Visit: Payer: Self-pay | Admitting: Rheumatology

## 2016-03-04 NOTE — Telephone Encounter (Signed)
Last Visit: 12/05/15 Next Visit: 05/08/16 Labs: 12/05/15 WNL Left message to remind patient she is due for labs this month.  Okay to refill MTX?

## 2016-03-06 ENCOUNTER — Telehealth: Payer: Self-pay | Admitting: Rheumatology

## 2016-03-06 ENCOUNTER — Other Ambulatory Visit: Payer: Self-pay | Admitting: *Deleted

## 2016-03-06 DIAGNOSIS — Z79899 Other long term (current) drug therapy: Secondary | ICD-10-CM

## 2016-03-06 NOTE — Telephone Encounter (Signed)
Irish Lack from [facility] requesting a new standing lab order for [employer], dob [id number].  TG#[phone].  Patient is there now.

## 2016-03-06 NOTE — Telephone Encounter (Signed)
Labs placed in the computer and released.

## 2016-03-07 ENCOUNTER — Other Ambulatory Visit: Payer: Self-pay | Admitting: Family Medicine

## 2016-03-07 ENCOUNTER — Other Ambulatory Visit (HOSPITAL_COMMUNITY): Payer: Self-pay | Admitting: Family Medicine

## 2016-03-07 DIAGNOSIS — M899 Disorder of bone, unspecified: Secondary | ICD-10-CM

## 2016-03-11 DIAGNOSIS — Z79899 Other long term (current) drug therapy: Secondary | ICD-10-CM | POA: Diagnosis not present

## 2016-03-12 LAB — CMP14+EGFR
ALT: 16 IU/L (ref 0–32)
AST: 18 IU/L (ref 0–40)
Albumin/Globulin Ratio: 1.6 (ref 1.2–2.2)
Albumin: 4 g/dL (ref 3.6–4.8)
Alkaline Phosphatase: 74 IU/L (ref 39–117)
BILIRUBIN TOTAL: 0.3 mg/dL (ref 0.0–1.2)
BUN/Creatinine Ratio: 18 (ref 12–28)
BUN: 12 mg/dL (ref 8–27)
CHLORIDE: 98 mmol/L (ref 96–106)
CO2: 26 mmol/L (ref 18–29)
Calcium: 9.3 mg/dL (ref 8.7–10.3)
Creatinine, Ser: 0.67 mg/dL (ref 0.57–1.00)
GFR calc non Af Amer: 92 (ref >59)
GFR, EST AFRICAN AMERICAN: 106 (ref >59)
GLUCOSE: 91 mg/dL (ref 65–99)
Globulin, Total: 2.5 (ref 1.5–4.5)
Potassium: 3.7 mmol/L (ref 3.5–5.2)
Sodium: 140 mmol/L (ref 134–144)
TOTAL PROTEIN: 6.5 g/dL (ref 6.0–8.5)

## 2016-03-12 LAB — CBC WITH DIFFERENTIAL/PLATELET
BASOS ABS: 0 10*3/uL (ref 0.0–0.2)
Basos: 0 %
EOS (ABSOLUTE): 0.1 10*3/uL (ref 0.0–0.4)
Eos: 2 %
HEMOGLOBIN: 11.7 g/dL (ref 11.1–15.9)
Hematocrit: 36.8 % (ref 34.0–46.6)
IMMATURE GRANS (ABS): 0 10*3/uL (ref 0.0–0.1)
IMMATURE GRANULOCYTES: 0 %
LYMPHS: 28 %
Lymphocytes Absolute: 2 10*3/uL (ref 0.7–3.1)
MCH: 30.6 pg (ref 26.6–33.0)
MCHC: 31.8 g/dL (ref 31.5–35.7)
MCV: 96 fL (ref 79–97)
MONOCYTES: 9 %
Monocytes Absolute: 0.7 10*3/uL (ref 0.1–0.9)
NEUTROS PCT: 61 %
Neutrophils Absolute: 4.2 10*3/uL (ref 1.4–7.0)
PLATELETS: 269 10*3/uL (ref 150–379)
RBC: 3.82 x10E6/uL (ref 3.77–5.28)
RDW: 15.8 % — ABNORMAL HIGH (ref 12.3–15.4)
WBC: 7 10*3/uL (ref 3.4–10.8)

## 2016-03-13 ENCOUNTER — Ambulatory Visit (HOSPITAL_COMMUNITY)
Admission: RE | Admit: 2016-03-13 | Discharge: 2016-03-13 | Disposition: A | Discharge status: Home / Self Care | Payer: Commercial Managed Care - HMO | Source: Ambulatory Visit | Attending: Family Medicine | Admitting: Family Medicine

## 2016-03-13 DIAGNOSIS — M899 Disorder of bone, unspecified: Secondary | ICD-10-CM | POA: Diagnosis not present

## 2016-03-13 DIAGNOSIS — M85851 Other specified disorders of bone density and structure, right thigh: Secondary | ICD-10-CM | POA: Insufficient documentation

## 2016-03-17 ENCOUNTER — Telehealth: Payer: Self-pay | Admitting: Radiology

## 2016-03-17 NOTE — Telephone Encounter (Signed)
-----   Message from Eliezer Lofts, [place] sent at 03/12/2016  6:03 PM EST ----- Send labs to PCP And tell patient CMP with GFR is normal CBC with differential is within normal limits.

## 2016-03-17 NOTE — Telephone Encounter (Signed)
I have called patient to advise labs are normal  

## 2016-04-03 DIAGNOSIS — Z6833 Body mass index (BMI) 33.0-33.9, adult: Secondary | ICD-10-CM | POA: Diagnosis not present

## 2016-04-03 DIAGNOSIS — M5410 Radiculopathy, site unspecified: Secondary | ICD-10-CM | POA: Diagnosis not present

## 2016-04-03 DIAGNOSIS — M545 Low back pain: Secondary | ICD-10-CM | POA: Diagnosis not present

## 2016-04-03 DIAGNOSIS — Z1389 Encounter for screening for other disorder: Secondary | ICD-10-CM | POA: Diagnosis not present

## 2016-04-03 DIAGNOSIS — M069 Rheumatoid arthritis, unspecified: Secondary | ICD-10-CM | POA: Diagnosis not present

## 2016-04-24 DIAGNOSIS — M47816 Spondylosis without myelopathy or radiculopathy, lumbar region: Secondary | ICD-10-CM | POA: Diagnosis not present

## 2016-04-24 DIAGNOSIS — M7138 Other bursal cyst, other site: Secondary | ICD-10-CM | POA: Diagnosis not present

## 2016-04-24 DIAGNOSIS — M47817 Spondylosis without myelopathy or radiculopathy, lumbosacral region: Secondary | ICD-10-CM | POA: Diagnosis not present

## 2016-05-02 ENCOUNTER — Other Ambulatory Visit: Payer: Self-pay | Admitting: Rheumatology

## 2016-05-02 NOTE — Telephone Encounter (Signed)
Last Visit: 12/05/15 Next Visit: 05/08/16 Labs: 03/11/16 WNL  Okay to refill MTX?

## 2016-05-06 DIAGNOSIS — M19042 Primary osteoarthritis, left hand: Secondary | ICD-10-CM

## 2016-05-06 DIAGNOSIS — M1712 Unilateral primary osteoarthritis, left knee: Secondary | ICD-10-CM | POA: Insufficient documentation

## 2016-05-06 DIAGNOSIS — M0579 Rheumatoid arthritis with rheumatoid factor of multiple sites without organ or systems involvement: Secondary | ICD-10-CM | POA: Insufficient documentation

## 2016-05-06 DIAGNOSIS — R5383 Other fatigue: Secondary | ICD-10-CM | POA: Insufficient documentation

## 2016-05-06 DIAGNOSIS — M19041 Primary osteoarthritis, right hand: Secondary | ICD-10-CM | POA: Insufficient documentation

## 2016-05-06 DIAGNOSIS — M25572 Pain in left ankle and joints of left foot: Secondary | ICD-10-CM | POA: Insufficient documentation

## 2016-05-06 DIAGNOSIS — Z79899 Other long term (current) drug therapy: Secondary | ICD-10-CM | POA: Insufficient documentation

## 2016-05-06 NOTE — Progress Notes (Signed)
Office Visit Note  Patient: Rose Lutz             Date of Birth: 1950/03/17           MRN: [id number]             PCP: Purvis Kilts, MD Referring: Sharilyn Sites, MD Visit Date: 05/08/2016 Occupation: _0 @    Subjective:  Pain of the Right Knee; Pain of the Left Knee; Pain of the Right Hip; and Pain of the Left Hip   History of Present Illness: Rose Lutz is a 66 y.o. female   Last seen 12/05/2015. Patient is doing well with her rheumatoid arthritis. Does not have any morning stiffness at this time. Doing really well with her medications that includes methotrexate 4 per week, folic acid 1 per day, Plaquenil 200 mg twice a day Monday through Friday only.  Her Plaquenil eye exam was done November December 2017 and it was negative.  Her complaint today is that her left knee is bothering her some. She did really well with her Visco supplementation in the past and completed the series 10/05/2015. She is requesting to restart that series again as soon as possible because her left knee is bothering her some.  She had bone density done in February 2018 ordered by Delman Cheadle, her PCP. According to the patient, her PCP reviewed the bone density report with her as well as a treatment plan.  Activities of Daily Living:  Patient reports morning stiffness for 5 minutes.   Patient Reports nocturnal pain.  Difficulty dressing/grooming: Denies Difficulty climbing stairs: Reports Difficulty getting out of chair: Reports Difficulty using hands for taps, buttons, cutlery, and/or writing: Denies   Review of Systems  Constitutional: Negative for fatigue.  HENT: Negative for mouth sores and mouth dryness.   Eyes: Negative for dryness.  Respiratory: Negative for shortness of breath.   Gastrointestinal: Negative for constipation and diarrhea.  Musculoskeletal: Negative for myalgias and myalgias.  Skin: Negative for sensitivity to sunlight.    Psychiatric/Behavioral: Negative for decreased concentration and sleep disturbance.    PMFS History:  Patient Active Problem List   Diagnosis Date Noted  . Rheumatoid arthritis involving multiple sites with positive rheumatoid factor ([place]) 05/06/2016  . High risk medications (not anticoagulants) long-term use 05/06/2016  . Primary osteoarthritis of both hands 05/06/2016  . Primary osteoarthritis of left knee 05/06/2016  . Pain in left ankle and joints of left foot 05/06/2016  . Other fatigue 05/06/2016  . Dizziness and giddiness 02/05/2015  . HTN (hypertension) 02/05/2015    Past Medical History:  Diagnosis Date  . Asbestos exposure    Spot on lung  . Chronic pain    On hydrocodone  . Essential hypertension   . GERD (gastroesophageal reflux disease)   . Pulmonary asbestosis [facility])    Patient states mild  . Rheumatoid arthritis ([place])     Family History  Problem Relation Age of Onset  . Breast cancer Mother   . Colon cancer Maternal Grandmother   . Pancreatic cancer Son    Past Surgical History:  Procedure Laterality Date  . ABDOMINAL HYSTERECTOMY    . BILATERAL OOPHORECTOMY    . CESAREAN SECTION     x3  . KNEE ARTHROPLASTY Right   . TONSILLECTOMY    . YAG LASER APPLICATION Right 63/01/6008   Procedure: YAG LASER APPLICATION;  Surgeon: Rutherford Guys, MD;  Location: [facility];  Service: Ophthalmology;  Laterality: Right;  . YAG LASER APPLICATION  Left 12/18/2015   Procedure: YAG LASER APPLICATION;  Surgeon: Rutherford Guys, MD;  Location: [facility];  Service: Ophthalmology;  Laterality: Left;   Social History   Social History Narrative  . No narrative on file     Objective: Vital Signs: BP 122/88   Pulse 82   Resp 16   Ht _0  (1.626 m)   Wt 200 lb (90.7 kg)   BMI 34.33 kg/m    Physical Exam  Constitutional: She is oriented to person, place, and time. She appears well-developed and well-nourished.  HENT:  Head: Normocephalic and atraumatic.  Eyes: EOM are normal.  Pupils are equal, round, and reactive to light.  Cardiovascular: Normal rate, regular rhythm and normal heart sounds.  Exam reveals no gallop and no friction rub.   No murmur heard. Pulmonary/Chest: Effort normal and breath sounds normal. She has no wheezes. She has no rales.  Abdominal: Soft. Bowel sounds are normal. She exhibits no distension. There is no tenderness. There is no guarding. No hernia.  Musculoskeletal: Normal range of motion. She exhibits no edema, tenderness or deformity.  Lymphadenopathy:    She has no cervical adenopathy.  Neurological: She is alert and oriented to person, place, and time. Coordination normal.  Skin: Skin is warm and dry. Capillary refill takes less than 2 seconds. No rash noted.  Psychiatric: She has a normal mood and affect. Her behavior is normal.  Nursing note and vitals reviewed.    Musculoskeletal Exam:    CDAI Exam: CDAI Homunculus Exam:   Joint Counts:  CDAI Tender Joint count: 0 CDAI Swollen Joint count: 0  Global Assessments:  Patient Global Assessment: 0 Provider Global Assessment: 0    Investigation: Findings:  Patient's last labs were done 08/30/2015 which showed CBC with differential normal CMP with   Orders Only on 03/06/2016  Component Date Value Ref Range Status  . Glucose 03/11/2016 91  65 - 99 mg/dL Final  . BUN 03/11/2016 12  8 - 27 mg/dL Final  . Creatinine, Ser 03/11/2016 0.67  0.57 - 1.00 mg/dL Final  . GFR calc non Af Amer 03/11/2016 92  >59 Final  . GFR calc Af Amer 03/11/2016 106  >59 Final  . BUN/Creatinine Ratio 03/11/2016 18  12 - 28 Final  . Sodium 03/11/2016 140  134 - 144 mmol/L Final  . Potassium 03/11/2016 3.7  3.5 - 5.2 mmol/L Final  . Chloride 03/11/2016 98  96 - 106 mmol/L Final  . CO2 03/11/2016 26  18 - 29 mmol/L Final  . Calcium 03/11/2016 9.3  8.7 - 10.3 mg/dL Final  . Total Protein 03/11/2016 6.5  6.0 - 8.5 g/dL Final  . Albumin 03/11/2016 4.0  3.6 - 4.8 g/dL Final  . Globulin, Total  03/11/2016 2.5  1.5 - 4.5 Final  . Albumin/Globulin Ratio 03/11/2016 1.6  1.2 - 2.2 Final  . Bilirubin Total 03/11/2016 0.3  0.0 - 1.2 mg/dL Final  . Alkaline Phosphatase 03/11/2016 74  39 - 117 IU/L Final  . AST 03/11/2016 18  0 - 40 IU/L Final  . ALT 03/11/2016 16  0 - 32 IU/L Final  . WBC 03/11/2016 7.0  3.4 - 10.8 x10E3/uL Final  . RBC 03/11/2016 3.82  3.77 - 5.28 x10E6/uL Final  . Hemoglobin 03/11/2016 11.7  11.1 - 15.9 g/dL Final  . Hematocrit 03/11/2016 36.8  34.0 - 46.6 % Final  . MCV 03/11/2016 96  79 - 97 fL Final  . MCH 03/11/2016 30.6  26.6 - 33.0  pg Final  . MCHC 03/11/2016 31.8  31.5 - 35.7 g/dL Final  . RDW 03/11/2016 15.8* 12.3 - 15.4 % Final  . Platelets 03/11/2016 269  150 - 379 x10E3/uL Final  . Neutrophils 03/11/2016 61  Not Estab. % Final  . Lymphs 03/11/2016 28  Not Estab. % Final  . Monocytes 03/11/2016 9  Not Estab. % Final  . Eos 03/11/2016 2  Not Estab. % Final  . Basos 03/11/2016 0  Not Estab. % Final  . Neutrophils Absolute 03/11/2016 4.2  1.4 - 7.0 x10E3/uL Final  . Lymphocytes Absolute 03/11/2016 2.0  0.7 - 3.1 x10E3/uL Final  . Monocytes Absolute 03/11/2016 0.7  0.1 - 0.9 x10E3/uL Final  . EOS (ABSOLUTE) 03/11/2016 0.1  0.0 - 0.4 x10E3/uL Final  . Basophils Absolute 03/11/2016 0.0  0.0 - 0.2 x10E3/uL Final  . Immature Granulocytes 03/11/2016 0  Not Estab. % Final  . Immature Grans (Abs) 03/11/2016 0.0  0.0 - 0.1 x10E3/uL Final  Office Visit on 12/05/2015  Component Date Value Ref Range Status  . WBC 12/05/2015 8.0  3.8 - 10.8 K/uL Final  . RBC 12/05/2015 3.94  3.80 - 5.10 MIL/uL Final  . Hemoglobin 12/05/2015 12.3  11.7 - 15.5 g/dL Final  . HCT 12/05/2015 37.8  35.0 - 45.0 % Final  . MCV 12/05/2015 95.9  80.0 - 100.0 fL Final  . MCH 12/05/2015 31.2  27.0 - 33.0 pg Final  . MCHC 12/05/2015 32.5  32.0 - 36.0 g/dL Final  . RDW 12/05/2015 15.2* 11.0 - 15.0 % Final  . Platelets 12/05/2015 338  140 - 400 K/uL Final  . MPV 12/05/2015 9.7  7.5 - 12.5 fL  Final  . Neutro Abs 12/05/2015 5680  1,500 - 7,800 cells/uL Final  . Lymphs Abs 12/05/2015 1600  850 - 3,900 cells/uL Final  . Monocytes Absolute 12/05/2015 640  200 - 950 cells/uL Final  . Eosinophils Absolute 12/05/2015 80  15 - 500 cells/uL Final  . Basophils Absolute 12/05/2015 0  0 - 200 cells/uL Final  . Neutrophils Relative % 12/05/2015 71  % Final  . Lymphocytes Relative 12/05/2015 20  % Final  . Monocytes Relative 12/05/2015 8  % Final  . Eosinophils Relative 12/05/2015 1  % Final  . Basophils Relative 12/05/2015 0  % Final  . Smear Review 12/05/2015 Criteria for review not met   Final  . Sodium 12/05/2015 139  135 - 146 mmol/L Final  . Potassium 12/05/2015 4.4  3.5 - 5.3 mmol/L Final  . Chloride 12/05/2015 105  98 - 110 mmol/L Final  . CO2 12/05/2015 26  20 - 31 mmol/L Final  . Glucose, Bld 12/05/2015 95  65 - 99 mg/dL Final  . BUN 12/05/2015 14  7 - 25 mg/dL Final  . Creat 12/05/2015 0.90  0.50 - 0.99 mg/dL Final   Comment:   For patients > or = 67 years of age: The upper reference limit for Creatinine is approximately 13% higher for people identified as African-American.     . Total Bilirubin 12/05/2015 0.4  0.2 - 1.2 mg/dL Final  . Alkaline Phosphatase 12/05/2015 67  33 - 130 U/L Final  . AST 12/05/2015 22  10 - 35 U/L Final  . ALT 12/05/2015 12  6 - 29 U/L Final  . Total Protein 12/05/2015 6.9  6.1 - 8.1 g/dL Final  . Albumin 12/05/2015 4.1  3.6 - 5.1 g/dL Final  . Calcium 12/05/2015 9.4  8.6 - 10.4 mg/dL Final  .  GFR, Est African American 12/05/2015 77  >=60 mL/min Final  . GFR, Est Non African American 12/05/2015 67  >=60 mL/min Final  . Vit D, 25-Hydroxy 12/05/2015 30  30 - 100 ng/mL Final   Comment: Vitamin D Status           25-OH Vitamin D        Deficiency                <20 ng/mL        Insufficiency         20 - 29 ng/mL        Optimal             > or = 30 ng/mL   For 25-OH Vitamin D testing on patients on D2-supplementation and patients for whom  quantitation of D2 and D3 fractions is required, the QuestAssureD 25-OH VIT D, (D2,D3), LC/MS/MS is recommended: order code [id number] (patients > 2 yrs).       Imaging: No results found.  Speciality Comments: No specialty comments available.    Procedures:  No procedures performed Allergies: Quinine derivatives; Sulfa antibiotics; and Percocet [oxycodone-acetaminophen]   Assessment / Plan:     Visit Diagnoses: Rheumatoid arthritis involving multiple sites with positive rheumatoid factor (H[facility])  High risk medications (not anticoagulants) long-term use - MTX-2.5MG tablet  4 tablets (10 mg total) by mouth once a weekPLQ- 200MG morning and 200 mg at night m-f, none on sat or sunday - Plan: CMP14+EGFR, CBC with Differential/Platelet  Primary osteoarthritis of both hands  Primary osteoarthritis of left knee  Pain in left ankle and joints of left foot  Other fatigue   Plan: #1: Rheumatoid arthritis with rheumatoid factor positive. Doing well. No joint pain stiffness or swelling. Doing well in the morning with morning stiffness. Patient states "I don't have any morning stiffness".  #2: High risk prescription. Taking methotrexate 4 pills every week. Folic acid 1 pill daily. Plaquenil 20 mg twice a day Monday through Friday only. None on Saturday or Sunday.  #3: History of right total knee replacement done about 10 years ago in [place]. Patient is complaining of pain in that area. She is trying to establish with a local orthopedist that she can see what the pain in the right knee is from. Left knee has been injected by our office with Visco supplementation in the past and patient has done well with that.  #4. Patient has a history of osteopenia. Patient had bone density done at any pain in [facility] ordered by her PCP Dr. Delman Cheadle, PA-C. Patient's femur on the right showed a T score of -1.4. She is orally seen her PCP and they have set up a treatment  plan.  #5: Refilled MTX 4/WEEK REFILLED FOLIC ACID 1MG QD, 90 DAY W/ 4 REFILL REFILLED PLQ 200 BID; M-F; 120 W/ 1 REFILL.  STANDING ORDER Q 3 MONTHS START MID MAY 2018  #6: Left knee pain. I ordered Visco supplementation today. Euflexxa was completed 10/05/2015 and patient did well. Starting to have some discomfort in the left knee and by the time the medication is approved and will be helpful to get patient's left knee feeling better.   Orders: Orders Placed This Encounter  Procedures  . CMP14+EGFR  . CBC with Differential/Platelet   Meds ordered this encounter  Medications  . methotrexate (RHEUMATREX) 2.5 MG tablet    Sig: TAKE 4 TABLETS ONE TIME WEEKLY ON TUESDAYS. CAUTION: CHEMOTHERAPY. PROTECT FROM LIGHT.  Dispense:  48 tablet    Refill:  0    Order Specific Question:   Supervising Provider    Answer:   Bo Merino [[id number]]  . hydroxychloroquine (PLAQUENIL) 200 MG tablet    Sig: Take 1 tablet (200 mg total) by mouth 2 (two) times daily. Monday through Friday    Dispense:  120 tablet    Refill:  1    Order Specific Question:   Supervising Provider    Answer:   Bo Merino [[id number]]  . folic acid (FOLVITE) 1 MG tablet    Sig: Take 1 tablet (1 mg total) by mouth daily.    Dispense:  90 tablet    Refill:  4    Order Specific Question:   Supervising Provider    Answer:   Bo Merino [phone]    Face-to-face time spent with patient was 30 minutes. 50% of time was spent in counseling and coordination of care.  Follow-Up Instructions: Return in about 5 months (around 10/08/2016) for RA,+rf, // MTX 4/WK, FOLIC 1MG , PLQ 035 BID M-F;// LEFT KNEE PAIN, APPLY EUFELXXA, , REFILL MEDS.   Eliezer Lofts, PA-C  Note - This record has been created using [employer].  Chart creation errors have been sought, but may not always  have been located. Such creation errors do not reflect on  the standard of medical care.

## 2016-05-07 ENCOUNTER — Ambulatory Visit: Payer: Commercial Managed Care - HMO | Admitting: Rheumatology

## 2016-05-08 ENCOUNTER — Ambulatory Visit (INDEPENDENT_AMBULATORY_CARE_PROVIDER_SITE_OTHER): Payer: Commercial Managed Care - HMO | Admitting: Rheumatology

## 2016-05-08 ENCOUNTER — Encounter: Payer: Self-pay | Admitting: Rheumatology

## 2016-05-08 VITALS — BP 122/88 | HR 82 | Resp 16 | Ht 64.0 in | Wt 200.0 lb

## 2016-05-08 DIAGNOSIS — M19042 Primary osteoarthritis, left hand: Secondary | ICD-10-CM | POA: Diagnosis not present

## 2016-05-08 DIAGNOSIS — R5383 Other fatigue: Secondary | ICD-10-CM

## 2016-05-08 DIAGNOSIS — M0579 Rheumatoid arthritis with rheumatoid factor of multiple sites without organ or systems involvement: Secondary | ICD-10-CM | POA: Diagnosis not present

## 2016-05-08 DIAGNOSIS — M19041 Primary osteoarthritis, right hand: Secondary | ICD-10-CM

## 2016-05-08 DIAGNOSIS — M25572 Pain in left ankle and joints of left foot: Secondary | ICD-10-CM

## 2016-05-08 DIAGNOSIS — Z79899 Other long term (current) drug therapy: Secondary | ICD-10-CM | POA: Diagnosis not present

## 2016-05-08 DIAGNOSIS — M1712 Unilateral primary osteoarthritis, left knee: Secondary | ICD-10-CM

## 2016-05-08 MED ORDER — HYDROXYCHLOROQUINE SULFATE 200 MG PO TABS: 200.0000 mg | ORAL_TABLET | Freq: Two times a day (BID) | ORAL | 1 refills | Status: DC

## 2016-05-08 MED ORDER — FOLIC ACID 1 MG PO TABS: 1.0000 mg | ORAL_TABLET | Freq: Every day | ORAL | 4 refills | Status: DC

## 2016-05-08 MED ORDER — METHOTREXATE 2.5 MG PO TABS: ORAL_TABLET | ORAL | 0 refills | Status: DC

## 2016-06-10 ENCOUNTER — Telehealth (INDEPENDENT_AMBULATORY_CARE_PROVIDER_SITE_OTHER): Payer: Self-pay | Admitting: Radiology

## 2016-06-10 NOTE — Telephone Encounter (Signed)
FW: Apply Euflexxa 3 left knee only.  Received: 1 week ago  Message Contents  Shona Needles, RT  Shantana Christon, RT        new   Previous Messages    ----- Message -----  From: Eliezer Lofts, PA-C  Sent: 05/08/2016 12:11 PM  To: Shona Needles, RT  Subject: Apply Euflexxa 3 left knee only.         Apply Euflexxa 3 left knee only.    Completed series 10/05/2015 and did well until lately starting to have some discomfort         Patient has [employer], please apply, thanks.

## 2016-06-20 ENCOUNTER — Other Ambulatory Visit: Payer: Self-pay | Admitting: *Deleted

## 2016-06-20 DIAGNOSIS — Z79899 Other long term (current) drug therapy: Secondary | ICD-10-CM | POA: Diagnosis not present

## 2016-06-21 LAB — CMP14+EGFR
ALT: 16 IU/L (ref 0–32)
AST: 26 IU/L (ref 0–40)
Albumin/Globulin Ratio: 1.4 (ref 1.2–2.2)
Albumin: 3.9 g/dL (ref 3.6–4.8)
Alkaline Phosphatase: 98 IU/L (ref 39–117)
BUN/Creatinine Ratio: 16 (ref 12–28)
BUN: 14 mg/dL (ref 8–27)
Bilirubin Total: 0.3 mg/dL (ref 0.0–1.2)
CALCIUM: 9.5 mg/dL (ref 8.7–10.3)
CO2: 24 mmol/L (ref 18–29)
CREATININE: 0.88 mg/dL (ref 0.57–1.00)
Chloride: 100 mmol/L (ref 96–106)
GFR calc Af Amer: 79 mL/min/{1.73_m2} (ref >59)
GFR, EST NON AFRICAN AMERICAN: 68 mL/min/{1.73_m2} (ref >59)
Globulin, Total: 2.7 g/dL (ref 1.5–4.5)
Glucose: 93 mg/dL (ref 65–99)
Potassium: 4.6 mmol/L (ref 3.5–5.2)
Sodium: 141 mmol/L (ref 134–144)
TOTAL PROTEIN: 6.6 g/dL (ref 6.0–8.5)

## 2016-06-21 LAB — CBC WITH DIFFERENTIAL/PLATELET
BASOS: 0 %
Basophils Absolute: 0 10*3/uL (ref 0.0–0.2)
EOS (ABSOLUTE): 0.1 10*3/uL (ref 0.0–0.4)
Eos: 1 %
HEMATOCRIT: 35.9 % (ref 34.0–46.6)
HEMOGLOBIN: 11.4 g/dL (ref 11.1–15.9)
IMMATURE GRANS (ABS): 0 10*3/uL (ref 0.0–0.1)
IMMATURE GRANULOCYTES: 0 %
LYMPHS: 29 %
Lymphocytes Absolute: 1.6 10*3/uL (ref 0.7–3.1)
MCH: 30 pg (ref 26.6–33.0)
MCHC: 31.8 g/dL (ref 31.5–35.7)
MCV: 95 fL (ref 79–97)
MONOCYTES: 10 %
Monocytes Absolute: 0.6 10*3/uL (ref 0.1–0.9)
NEUTROS PCT: 60 %
Neutrophils Absolute: 3.3 10*3/uL (ref 1.4–7.0)
PLATELETS: 347 10*3/uL (ref 150–379)
RBC: 3.8 x10E6/uL (ref 3.77–5.28)
RDW: 15 % (ref 12.3–15.4)
WBC: 5.6 10*3/uL (ref 3.4–10.8)

## 2016-06-23 DIAGNOSIS — M546 Pain in thoracic spine: Secondary | ICD-10-CM | POA: Diagnosis not present

## 2016-06-23 NOTE — Progress Notes (Signed)
Please tell patient her CBC with differential and CMP with GFR within normal limits and please forward labs to her PCP if needed.

## 2016-06-26 ENCOUNTER — Other Ambulatory Visit (HOSPITAL_COMMUNITY): Payer: Self-pay | Admitting: Neurosurgery

## 2016-06-26 DIAGNOSIS — M546 Pain in thoracic spine: Secondary | ICD-10-CM

## 2016-07-09 NOTE — Telephone Encounter (Signed)
Faxed Euflexxa VOB application to [phone]

## 2016-07-17 DIAGNOSIS — Z01411 Encounter for gynecological examination (general) (routine) with abnormal findings: Secondary | ICD-10-CM | POA: Diagnosis not present

## 2016-07-17 DIAGNOSIS — Z6833 Body mass index (BMI) 33.0-33.9, adult: Secondary | ICD-10-CM | POA: Diagnosis not present

## 2016-07-17 DIAGNOSIS — Z0000 Encounter for general adult medical examination without abnormal findings: Secondary | ICD-10-CM | POA: Diagnosis not present

## 2016-07-17 DIAGNOSIS — Z1389 Encounter for screening for other disorder: Secondary | ICD-10-CM | POA: Diagnosis not present

## 2016-07-17 DIAGNOSIS — Z96651 Presence of right artificial knee joint: Secondary | ICD-10-CM | POA: Diagnosis not present

## 2016-07-17 DIAGNOSIS — M069 Rheumatoid arthritis, unspecified: Secondary | ICD-10-CM | POA: Diagnosis not present

## 2016-09-12 ENCOUNTER — Other Ambulatory Visit: Payer: Self-pay | Admitting: Rheumatology

## 2016-09-12 DIAGNOSIS — Z79899 Other long term (current) drug therapy: Secondary | ICD-10-CM | POA: Diagnosis not present

## 2016-09-13 LAB — CMP14+EGFR
A/G RATIO: 1.2 (ref 1.2–2.2)
ALBUMIN: 3.6 g/dL (ref 3.6–4.8)
ALT: 12 IU/L (ref 0–32)
AST: 22 IU/L (ref 0–40)
Alkaline Phosphatase: 98 IU/L (ref 39–117)
BUN / CREAT RATIO: 16 (ref 12–28)
BUN: 13 mg/dL (ref 8–27)
Bilirubin Total: 0.4 mg/dL (ref 0.0–1.2)
CALCIUM: 9.5 mg/dL (ref 8.7–10.3)
CO2: 23 mmol/L (ref 20–29)
CREATININE: 0.8 mg/dL (ref 0.57–1.00)
Chloride: 99 mmol/L (ref 96–106)
GFR, EST AFRICAN AMERICAN: 88 mL/min/{1.73_m2} (ref >59)
GFR, EST NON AFRICAN AMERICAN: 77 mL/min/{1.73_m2} (ref >59)
GLOBULIN, TOTAL: 3 g/dL (ref 1.5–4.5)
Glucose: 105 mg/dL — ABNORMAL HIGH (ref 65–99)
POTASSIUM: 3.9 mmol/L (ref 3.5–5.2)
SODIUM: 140 mmol/L (ref 134–144)
TOTAL PROTEIN: 6.6 g/dL (ref 6.0–8.5)

## 2016-09-13 LAB — CBC WITH DIFFERENTIAL/PLATELET
BASOS: 0 %
Basophils Absolute: 0 10*3/uL (ref 0.0–0.2)
EOS (ABSOLUTE): 0.1 10*3/uL (ref 0.0–0.4)
EOS: 1 %
Hematocrit: 35.7 % (ref 34.0–46.6)
Hemoglobin: 11.4 g/dL (ref 11.1–15.9)
IMMATURE GRANS (ABS): 0 10*3/uL (ref 0.0–0.1)
IMMATURE GRANULOCYTES: 0 %
Lymphocytes Absolute: 2.2 10*3/uL (ref 0.7–3.1)
Lymphs: 23 %
MCH: 29.2 pg (ref 26.6–33.0)
MCHC: 31.9 g/dL (ref 31.5–35.7)
MCV: 91 fL (ref 79–97)
MONOS ABS: 0.8 10*3/uL (ref 0.1–0.9)
Monocytes: 8 %
NEUTROS ABS: 6.6 10*3/uL (ref 1.4–7.0)
Neutrophils: 68 %
PLATELETS: 411 10*3/uL — AB (ref 150–379)
RBC: 3.91 x10E6/uL (ref 3.77–5.28)
RDW: 16 % — AB (ref 12.3–15.4)
WBC: 9.6 10*3/uL (ref 3.4–10.8)

## 2016-09-15 NOTE — Progress Notes (Signed)
Mild elevation of platelets.We will continue to monitor.

## 2016-09-18 DIAGNOSIS — M069 Rheumatoid arthritis, unspecified: Secondary | ICD-10-CM | POA: Diagnosis not present

## 2016-09-18 DIAGNOSIS — Z6831 Body mass index (BMI) 31.0-31.9, adult: Secondary | ICD-10-CM | POA: Diagnosis not present

## 2016-09-18 DIAGNOSIS — F320 Major depressive disorder, single episode, mild: Secondary | ICD-10-CM | POA: Diagnosis not present

## 2016-09-18 DIAGNOSIS — E6609 Other obesity due to excess calories: Secondary | ICD-10-CM | POA: Diagnosis not present

## 2016-09-18 DIAGNOSIS — Y792 Prosthetic and other implants, materials and accessory orthopedic devices associated with adverse incidents: Secondary | ICD-10-CM | POA: Diagnosis not present

## 2016-10-02 DIAGNOSIS — I10 Essential (primary) hypertension: Secondary | ICD-10-CM | POA: Diagnosis not present

## 2016-10-02 DIAGNOSIS — E6609 Other obesity due to excess calories: Secondary | ICD-10-CM | POA: Diagnosis not present

## 2016-10-02 DIAGNOSIS — M069 Rheumatoid arthritis, unspecified: Secondary | ICD-10-CM | POA: Diagnosis not present

## 2016-10-02 DIAGNOSIS — Z6831 Body mass index (BMI) 31.0-31.9, adult: Secondary | ICD-10-CM | POA: Diagnosis not present

## 2016-10-03 NOTE — Progress Notes (Signed)
Office Visit Note  Patient: Rose Lutz             Date of Birth: 25-Jan-1950           MRN: [id number]             PCP: Sharilyn Sites, MD Referring: Sharilyn Sites, MD Visit Date: 10/10/2016 Occupation: @GUAROCC @    Subjective:  Pain in multiple joints.   History of Present Illness: BRIHANA Lutz is a 66 y.o. female with history of sero positive erosive rheumatoid arthritis. She states that about a month ago she had a severe flare to the point that she was having difficulty walking and doing routine activities. She states she was seen by her PCP who gave her an intramuscular cortisone injection and it helped her symptoms. She is doing better now. She has some discomfort in her joints off and on now.  Activities of Daily Living:  Patient reports morning stiffness for 30 minutes.   Patient Reports nocturnal pain. Knee joints Difficulty dressing/grooming: Denies Difficulty climbing stairs: Reports Difficulty getting out of chair: Denies Difficulty using hands for taps, buttons, cutlery, and/or writing: Denies   Review of Systems  Constitutional: Positive for fatigue. Negative for night sweats, weight gain, weight loss and weakness.  HENT: Negative for mouth sores, trouble swallowing, trouble swallowing, mouth dryness and nose dryness.   Eyes: Negative for pain, redness, visual disturbance and dryness.  Respiratory: Negative for cough, shortness of breath and difficulty breathing.   Cardiovascular: Positive for hypertension. Negative for chest pain, palpitations, irregular heartbeat and swelling in legs/feet.  Gastrointestinal: Negative for blood in stool, constipation and diarrhea.  Endocrine: Negative for increased urination.  Genitourinary: Negative for vaginal dryness.  Musculoskeletal: Positive for arthralgias, joint pain, joint swelling and morning stiffness. Negative for myalgias, muscle weakness, muscle tenderness and myalgias.  Skin: Negative for color change,  rash, hair loss, skin tightness, ulcers and sensitivity to sunlight.  Allergic/Immunologic: Negative for susceptible to infections.  Neurological: Negative for dizziness, memory loss and night sweats.  Hematological: Negative for swollen glands.  Psychiatric/Behavioral: Positive for depressed mood and sleep disturbance. The patient is not nervous/anxious.     PMFS History:  Patient Active Problem List   Diagnosis Date Noted  . Rheumatoid arthritis involving multiple sites with positive rheumatoid factor ([place]) 05/06/2016  . High risk medications (not anticoagulants) long-term use 05/06/2016  . Primary osteoarthritis of both hands 05/06/2016  . Primary osteoarthritis of left knee 05/06/2016  . Pain in left ankle and joints of left foot 05/06/2016  . Other fatigue 05/06/2016  . Dizziness and giddiness 02/05/2015  . HTN (hypertension) 02/05/2015    Past Medical History:  Diagnosis Date  . Asbestos exposure    Spot on lung  . Chronic pain    On hydrocodone  . Essential hypertension   . GERD (gastroesophageal reflux disease)   . Pulmonary asbestosis [facility])    Patient states mild  . Rheumatoid arthritis ([place])     Family History  Problem Relation Age of Onset  . Breast cancer Mother   . Colon cancer Maternal Grandmother   . Pancreatic cancer Son    Past Surgical History:  Procedure Laterality Date  . ABDOMINAL HYSTERECTOMY    . BILATERAL OOPHORECTOMY    . CESAREAN SECTION     x3  . KNEE ARTHROPLASTY Right   . TONSILLECTOMY    . YAG LASER APPLICATION Right 65/46/5035   Procedure: YAG LASER APPLICATION;  Surgeon: Rutherford Guys, MD;  [facility];  Service: Ophthalmology;  Laterality: Right;  . YAG LASER APPLICATION Left 53/66/4403   Procedure: YAG LASER APPLICATION;  Surgeon: Rutherford Guys, MD;  Location: [facility];  Service: Ophthalmology;  Laterality: Left;   Social History   Social History Narrative  . No narrative on file     Objective: Vital Signs: BP 93/64 (BP  Location: Left Arm, Patient Position: Sitting, Cuff Size: Normal)   Pulse 93   Resp 17   Ht 5\' 4"  (1.626 m)   Wt 186 lb (84.4 kg)   BMI 31.93 kg/m    Physical Exam  Constitutional: She is oriented to person, place, and time. She appears well-developed and well-nourished.  HENT:  Head: Normocephalic and atraumatic.  Eyes: Conjunctivae and EOM are normal.  Neck: Normal range of motion.  Cardiovascular: Normal rate, regular rhythm, normal heart sounds and intact distal pulses.   Pulmonary/Chest: Effort normal and breath sounds normal.  Abdominal: Soft. Bowel sounds are normal.  Lymphadenopathy:    She has no cervical adenopathy.  Neurological: She is alert and oriented to person, place, and time.  Skin: Skin is warm and dry. Capillary refill takes less than 2 seconds.  Climbing noted in her fingernails  Psychiatric: She has a normal mood and affect. Her behavior is normal.  Nursing note and vitals reviewed.    Musculoskeletal Exam: C-spine limited range of motion thoracic and lumbar spine limited range of motion. She has discomfort and limitation of range of motion of her left shoulder. Elbow joints with good range of motion. She has synovial thickening over bilateral wrist joints but no synovitis was noted. She has synovial thickening over bilateral MCP joints but no synovitis was noted. She has some DIP PIP changes consistent with osteoarthritis. Her right total knee is replaced with some warmth. She has crepitus and discomfort range of motion of her left knee joint. She also has some changes from rheumatoid arthritis in her bilateral feet. No synovitis was noted.  CDAI Exam: CDAI Homunculus Exam:   Tenderness:  Right hand: 2nd MCP Left hand: 2nd MCP RLE: tibiofemoral  Joint Counts:  CDAI Tender Joint count: 3 CDAI Swollen Joint count: 0  Global Assessments:  Patient Global Assessment: 7 Provider Global Assessment: 5  CDAI Calculated Score: 15    Investigation: No  additional findings.PLQ eye exam?  CBC Latest Ref Rng & Units 09/12/2016 06/20/2016 03/11/2016  WBC 3.4 - 10.8 x10E3/uL 9.6 5.6 7.0  Hemoglobin 11.1 - 15.9 g/dL 11.4 11.4 11.7  Hematocrit 34.0 - 46.6 % 35.7 35.9 36.8  Platelets 150 - 379 x10E3/uL 411(H) 347 269   CMP Latest Ref Rng & Units 09/12/2016 06/20/2016 03/11/2016  Glucose 65 - 99 mg/dL 105(H) 93 91  BUN 8 - 27 mg/dL 13 14 12   Creatinine 0.57 - 1.00 mg/dL 0.80 0.88 0.67  Sodium 134 - 144 mmol/L 140 141 140  Potassium 3.5 - 5.2 mmol/L 3.9 4.6 3.7  Chloride 96 - 106 mmol/L 99 100 98  CO2 20 - 29 mmol/L 23 24 26   Calcium 8.7 - 10.3 mg/dL 9.5 9.5 9.3  Total Protein 6.0 - 8.5 g/dL 6.6 6.6 6.5  Total Bilirubin 0.0 - 1.2 mg/dL 0.4 0.3 0.3  Alkaline Phos 39 - 117 IU/L 98 98 74  AST 0 - 40 IU/L 22 26 18   ALT 0 - 32 IU/L 12 16 16     Imaging: Xr Foot 2 Views Left  Result Date: 10/10/2016 Severe narrowing of all MTP joints with ankylosis of first  second and third fourth MTP joints erosive changes noted in almost all MTP joints. PIP/DIP narrowing was noted. Ankylosis of metatarsotarsal and intertarsal joints were noted. Collapse of tibiotalar and subtalar joint was noted. Impression severe rheumatoid arthritis involving her right foot and ankle.  Xr Foot 2 Views Right  Result Date: 10/10/2016 Severe narrowing of all MTP joints noted. Ankylosis of right first MTP and first PIP joint was noted. DIP PIP narrowing was noted. Erosive changes noted in the second fourth and fifth MTP joints. Intertarsal joint space narrowing was noted. Tibiotalar joint collapse was noted. Subtalar joint collapse was noted. Impression severe erosive rheumatoid arthritis involving right foot and ankle.  Xr Hand 2 View Left  Result Date: 10/10/2016 Juxta articular osteopenia noted. First, second, third, fourth MCP joint narrowing was noted. All PIP/DIP narrowing was noted. Erosive changes were noted in second and third PIP joints. Intercarpal joint space narrowing was  noted. Impression: These findings are consistent with rheumatoid arthritis severe erosive disease.  Xr Hand 2 View Right  Result Date: 10/10/2016 Juxta articular osteopenia noted. Severe narrowing of the right second and third MCP joint. Narrowing of all PIP and DIP joints noted. Severe metacarpocarpal, intercarpal and radiocarpal narrowing was noted. Impression: These findings are consistent with rheumatoid arthritis severe disease with erosive changes.   Speciality Comments: No specialty comments available.    Procedures:  No procedures performed Allergies: Quinine derivatives; Sulfa antibiotics; and Percocet [oxycodone-acetaminophen]   Assessment / Plan:     Visit Diagnoses: Rheumatoid arthritis involving multiple sites with positive rheumatoid factor (HCC) - +RF, +CCP, +ANA, erosive, contractures. Patient has a flare about a month ago. She states she was given a cortisone injection and improved. She had no synovitis on examination today. She is on low-dose methotrexate. We discussed increasing methotrexate to 6 tablets per week. We will check labs in 2 weeks, 2 months and then every 3 months to monitor for drug toxicity.  High risk medication use - MTX 4 atbs po qwk, folic acid1 mg po qd, WFU932 mg po bid M-F. eye exam 10/17 per patient. Her labs have been stable.  Pain in both hands - Plan: XR Hand 2 View Right, XR Hand 2 View Left  Finger clubbing: I'll schedule chest x-ray PA and lateral [facility].  Pain in both feet - Plan: XR Foot 2 Views Right, XR Foot 2 Views Left  Primary osteoarthritis of left knee - severe: She continues to have discomfort walking.  History of total right knee replacement: Is still painful. She's followed by Dr. Lorin Mercy.  History of hypertension: Her blood pressure is well controlled.  History of gastroesophageal reflux (GERD)  Decreased GFR  History of anemia    Orders: Orders Placed This Encounter  Procedures  . XR Hand 2 View Right    . XR Hand 2 View Left  . XR Foot 2 Views Right  . XR Foot 2 Views Left  . DG Chest 2 View  . CBC with Differential/Platelet  . COMPLETE METABOLIC PANEL WITH GFR   Meds ordered this encounter  Medications  . methotrexate (RHEUMATREX) 2.5 MG tablet    Sig: TAKE 6 TABLETS ONE TIME WEEKLY ON TUESDAYS. CAUTION: CHEMOTHERAPY. PROTECT FROM LIGHT.    Dispense:  48 tablet    Refill:  0    Face-to-face time spent with patient was 30 minutes. Greater than 50% of time was spent in counseling and coordination of care.  Follow-Up Instructions: Return in about 3 months (around 01/09/2017) for  Rheumatoid arthritis.   Bo Merino, MD  Note - This record has been created using Editor, commission[employer].  Chart creation errors have been sought, but may not always  have been located. Such creation errors do not reflect on  the standard of medical care.

## 2016-10-09 ENCOUNTER — Ambulatory Visit: Payer: Commercial Managed Care - HMO | Admitting: Rheumatology

## 2016-10-09 ENCOUNTER — Ambulatory Visit (INDEPENDENT_AMBULATORY_CARE_PROVIDER_SITE_OTHER): Payer: Commercial Managed Care - HMO | Admitting: Orthopaedic Surgery

## 2016-10-09 ENCOUNTER — Encounter (INDEPENDENT_AMBULATORY_CARE_PROVIDER_SITE_OTHER): Payer: Self-pay | Admitting: Orthopaedic Surgery

## 2016-10-09 VITALS — BP 92/58 | HR 100 | Ht 64.0 in | Wt 186.0 lb

## 2016-10-09 DIAGNOSIS — M0579 Rheumatoid arthritis with rheumatoid factor of multiple sites without organ or systems involvement: Secondary | ICD-10-CM

## 2016-10-09 NOTE — Progress Notes (Signed)
Office Visit Note   Patient: Rose Lutz           Date of Birth: [birth year]           MRN: [id number] Visit Date: 10/09/2016              Requested by: Sharilyn Sites, [address] [place], [place] [address] PCP: Sharilyn Sites, MD   Assessment & Plan: Visit Diagnoses:  1. Rheumatoid arthritis involving multiple sites with positive rheumatoid factor ([place])     Plan: Patient currently is significantly improved last 3 weeksspecific joint is giving her terrible problems. We can check her back again on an as-needed basis she has scheduled duplex injections coming up in her left knee which worked well last year. Follow. Can be as needed.  Follow-Up Instructions: Return if symptoms worsen or fail to improve.   Orders:  No orders of the defined types were placed in this encounter.  No orders of the defined types were placed in this encounter.     Procedures: No procedures performed   Clinical Data: No additional findings.   Subjective: Chief Complaint  Patient presents with  . Left Knee - Pain  . Right Knee - Pain  . Left Hip - Pain  . Right Hip - Pain    HPI 66 year old female seen for appointment with long-standing rheumatoid arthritis. She made the appointment several weeks ago when she was having significant pain. 3 weeks ago states she was barely able to get out of bed. She had pain in her right total knee arthroplasty that was done by Dr.Housa in [place] 11 years ago and had a cortisone injection in her knee and she states this is helped a lot. She is having shoulder pain problems also pain in her left knee and is scheduled for repeat Deflux injection shortly which previously last her gave her good relief. She has autofused ankle has significant tarsometatarsal degenerative changes talonavicular joint appears fused on plain radiographs. First MTP degenerative changes of previous x-rays. Previous x-rays right knee last year 2017 showed good position total knee  arthroplasty on the right which was cemented and some mild joint space narrowing on the left. Patient states in the last week she's been doing much better getting around better and really doesn't need any specific joint treated other than the upcoming Euflex injections left knee. Patient used to be on Remicade but states she can't take any longer since his cost prohibitive. She is taking Actonel and methotrexate.  Review of Systems is systems updated positive for long-term rheumatoid arthritis multi-joint involvement. History of hypertension. Years ago she was on prednisone. Bone density test showed osteopenia that within the last year. Previous C-section right knee replacement 2007 hysterectomy 98. Acid reflux cataracts depression hypertension osteoporosis. Patient currently does not smoke but did smoke one pack a day for 30 years. Patient relates she had a staph infection right knee after the surgery. She does not know if she is MRSA positive.   Objective: Vital Signs: BP (!) 92/58   Pulse 100   Ht 5\' 4"  (1.626 m)   Wt 186 lb (84.4 kg)   BMI 31.93 kg/m   Physical Exam  Constitutional: She is oriented to person, place, and time. She appears well-developed.  HENT:  Head: Normocephalic.  Right Ear: External ear normal.  Left Ear: External ear normal.  Eyes: Pupils are equal, round, and reactive to light.  Neck: No tracheal deviation present. No thyromegaly present.  Cardiovascular: Normal rate.  Pulmonary/Chest: Effort normal.  Abdominal: Soft.  Musculoskeletal:  Patient has a short stride gait walks with the knees slightly flexed position. She has some discomfort with internal rotation of both hips no ankle motion noted on the right foot subtalar joint does not move. Prominent dorsal tarsometatarsal osteophytes. First MTP palpable spurs noted. Mild edema the right lower extremity minimal venous discoloration. Distal pulses are palpable right and left. Well-healed midline incision right  knee.  Neurological: She is alert and oriented to person, place, and time.  Skin: Skin is warm and dry.  Psychiatric: She has a normal mood and affect. Her behavior is normal.    Ortho Exam  Specialty Comments:  No specialty comments available.  Imaging: No results found.   PMFS History: Patient Active Problem List   Diagnosis Date Noted  . Rheumatoid arthritis involving multiple sites with positive rheumatoid factor ([place]) 05/06/2016  . High risk medications (not anticoagulants) long-term use 05/06/2016  . Primary osteoarthritis of both hands 05/06/2016  . Primary osteoarthritis of left knee 05/06/2016  . Pain in left ankle and joints of left foot 05/06/2016  . Other fatigue 05/06/2016  . Dizziness and giddiness 02/05/2015  . HTN (hypertension) 02/05/2015   Past Medical History:  Diagnosis Date  . Asbestos exposure    Spot on lung  . Chronic pain    On hydrocodone  . Essential hypertension   . GERD (gastroesophageal reflux disease)   . Pulmonary asbestosis [facility])    Patient states mild  . Rheumatoid arthritis ([place])     Family History  Problem Relation Age of Onset  . Breast cancer Mother   . Colon cancer Maternal Grandmother   . Pancreatic cancer Son     Past Surgical History:  Procedure Laterality Date  . ABDOMINAL HYSTERECTOMY    . BILATERAL OOPHORECTOMY    . CESAREAN SECTION     x3  . KNEE ARTHROPLASTY Right   . TONSILLECTOMY    . YAG LASER APPLICATION Right 62/26/3335   Procedure: YAG LASER APPLICATION;  Surgeon: Rutherford Guys, MD;  Location[facility];  Service: Ophthalmology;  Laterality: Right;  . YAG LASER APPLICATION Left 45/62/5638   Procedure: YAG LASER APPLICATION;  Surgeon: Rutherford Guys, MD;  Location: [facility];  Service: Ophthalmology;  Laterality: Left;   Social History   Occupational History  . Not on file.   Social History Main Topics  . Smoking status: Former Smoker    Start date: 01/21/1968    Quit date: 01/20/2006  . Smokeless tobacco: Never  Used  . Alcohol use No  . Drug use: No  . Sexual activity: Not on file

## 2016-10-10 ENCOUNTER — Encounter: Payer: Self-pay | Admitting: Rheumatology

## 2016-10-10 ENCOUNTER — Ambulatory Visit (INDEPENDENT_AMBULATORY_CARE_PROVIDER_SITE_OTHER): Payer: Commercial Managed Care - HMO

## 2016-10-10 ENCOUNTER — Ambulatory Visit (INDEPENDENT_AMBULATORY_CARE_PROVIDER_SITE_OTHER): Payer: Self-pay

## 2016-10-10 ENCOUNTER — Ambulatory Visit (INDEPENDENT_AMBULATORY_CARE_PROVIDER_SITE_OTHER): Payer: Commercial Managed Care - HMO | Admitting: Rheumatology

## 2016-10-10 VITALS — BP 93/64 | HR 93 | Resp 17 | Ht 64.0 in | Wt 186.0 lb

## 2016-10-10 DIAGNOSIS — R683 Clubbing of fingers: Secondary | ICD-10-CM

## 2016-10-10 DIAGNOSIS — Z862 Personal history of diseases of the blood and blood-forming organs and certain disorders involving the immune mechanism: Secondary | ICD-10-CM

## 2016-10-10 DIAGNOSIS — M79642 Pain in left hand: Secondary | ICD-10-CM | POA: Diagnosis not present

## 2016-10-10 DIAGNOSIS — M79672 Pain in left foot: Secondary | ICD-10-CM

## 2016-10-10 DIAGNOSIS — Z8719 Personal history of other diseases of the digestive system: Secondary | ICD-10-CM

## 2016-10-10 DIAGNOSIS — M79671 Pain in right foot: Secondary | ICD-10-CM

## 2016-10-10 DIAGNOSIS — M1712 Unilateral primary osteoarthritis, left knee: Secondary | ICD-10-CM | POA: Diagnosis not present

## 2016-10-10 DIAGNOSIS — Z79899 Other long term (current) drug therapy: Secondary | ICD-10-CM

## 2016-10-10 DIAGNOSIS — Z96651 Presence of right artificial knee joint: Secondary | ICD-10-CM | POA: Diagnosis not present

## 2016-10-10 DIAGNOSIS — Z8679 Personal history of other diseases of the circulatory system: Secondary | ICD-10-CM | POA: Diagnosis not present

## 2016-10-10 DIAGNOSIS — M79641 Pain in right hand: Secondary | ICD-10-CM

## 2016-10-10 DIAGNOSIS — M0579 Rheumatoid arthritis with rheumatoid factor of multiple sites without organ or systems involvement: Secondary | ICD-10-CM

## 2016-10-10 DIAGNOSIS — R944 Abnormal results of kidney function studies: Secondary | ICD-10-CM

## 2016-10-10 MED ORDER — METHOTREXATE 2.5 MG PO TABS: ORAL_TABLET | ORAL | 0 refills | Status: DC

## 2016-10-10 NOTE — Progress Notes (Deleted)
Office Visit Note  Patient: Rose Lutz             Date of Birth: 06-22-1950           MRN: [id number]             PCP: Sharilyn Sites, MD Visit Date: 10/10/2016   Assessment: Visit Diagnoses: Rheumatoid arthritis involving multiple sites with positive rheumatoid factor (HCC) - +RF, +CCP, +ANA, erosive, contractures  High risk medication use - MTX, folic acid, PLQ. eye exam?  Primary osteoarthritis of left knee - severe  History of total right knee replacement  History of hypertension  History of gastroesophageal reflux (GERD)  Decreased GFR  History of anemia   Follow-Up Instructions: No Follow-up on file.  Orders: No orders of the defined types were placed in this encounter.  No orders of the defined types were placed in this encounter.    Subjective:    Allergies: Quinine derivatives; Sulfa antibiotics; and Percocet [oxycodone-acetaminophen]   Activities of Daily Living: ***   History of Present Illness: Rose Lutz is a 66 y.o. female ***   Review of Systems  Constitutional: Positive for appetite change, fatigue and weight loss.  HENT: Negative for mouth dryness.   Eyes: Negative for dryness.  Respiratory: Negative for difficulty breathing.   Cardiovascular: Positive for swelling in legs/feet.  Gastrointestinal: Negative for nausea.  Endocrine: Positive for cold intolerance.  Genitourinary: Negative for difficulty urinating.  Musculoskeletal: Positive for arthralgias, gait problem, joint pain, joint swelling, muscle weakness and morning stiffness.  Skin: Negative for rash and hair loss.  Neurological: Positive for dizziness.  Hematological: Positive for bruising/bleeding tendency.  Psychiatric/Behavioral: Negative for sleep disturbance.     Investigation: No additional findings.   Objective: Vital Signs: BP 93/64 (BP Location: Left Arm, Patient Position: Sitting, Cuff Size: Normal)   Pulse 93   Resp 17   Ht 5\' 4"  (1.626 m)   Wt 186  lb (84.4 kg)   BMI 31.93 kg/m    Physical Exam   Musculoskeletal Exam: ***  CDAI Exam: No CDAI exam completed.  CDAI comments:  Speciality Comments: No specialty comments available.  Imaging: No results found.   PMFS History:  Patient Active Problem List   Diagnosis Date Noted  . Rheumatoid arthritis involving multiple sites with positive rheumatoid factor ([place]) 05/06/2016  . High risk medications (not anticoagulants) long-term use 05/06/2016  . Primary osteoarthritis of both hands 05/06/2016  . Primary osteoarthritis of left knee 05/06/2016  . Pain in left ankle and joints of left foot 05/06/2016  . Other fatigue 05/06/2016  . Dizziness and giddiness 02/05/2015  . HTN (hypertension) 02/05/2015    Past Medical History:  Diagnosis Date  . Asbestos exposure    Spot on lung  . Chronic pain    On hydrocodone  . Essential hypertension   . GERD (gastroesophageal reflux disease)   . Pulmonary asbestosis [facility])    Patient states mild  . Rheumatoid arthritis ([place])     Family History  Problem Relation Age of Onset  . Breast cancer Mother   . Colon cancer Maternal Grandmother   . Pancreatic cancer Son    Past Surgical History:  Procedure Laterality Date  . ABDOMINAL HYSTERECTOMY    . BILATERAL OOPHORECTOMY    . CESAREAN SECTION     x3  . KNEE ARTHROPLASTY Right   . TONSILLECTOMY    . YAG LASER APPLICATION Right 24/58/0998   Procedure: YAG LASER APPLICATION;  Surgeon: Elta Guadeloupe  Gershon Crane, MD;  Location: [facility];  Service: Ophthalmology;  Laterality: Right;  . YAG LASER APPLICATION Left 95/07/2255   Procedure: YAG LASER APPLICATION;  Surgeon: Rutherford Guys, MD;  Location: [facility];  Service: Ophthalmology;  Laterality: Left;   Social History   Social History Narrative  . No narrative on file     Procedures:  No procedures performed  [name] Ren, RT  Note - This record has been created using [employer]. Chart creation errors have been sought, but may not always  have been located. Such creation errors do not reflect on the standard of medical care.

## 2016-10-10 NOTE — Patient Instructions (Addendum)
Go to [facility] for your Chest Xray, radiology department , they have the order.  Standing Labs We placed an order today for your standing lab work.    Please come back and get your standing labs in  2 weeks after increasing Methotrexate, then in 2 months then every 3 months  We have open lab Monday through Friday from 8:30-11:30 AM and 1:30-4 PM at the office of Dr. Bo Merino.   The office is located at [address] [place], [place], [place] [address] No appointment is necessary.   Labs are drawn by [facility].  You may receive a bill from [facility] for your lab work. If you have any questions regarding directions or hours of operation,  please call [phone].    you can go to any [facility] lab for this bloodwork, if you can not come here

## 2016-10-13 ENCOUNTER — Ambulatory Visit (HOSPITAL_COMMUNITY)
Admission: RE | Admit: 2016-10-13 | Discharge: 2016-10-13 | Disposition: A | Discharge status: Home / Self Care | Payer: Commercial Managed Care - HMO | Source: Ambulatory Visit | Attending: Rheumatology | Admitting: Rheumatology

## 2016-10-13 ENCOUNTER — Telehealth: Payer: Self-pay | Admitting: Rheumatology

## 2016-10-13 DIAGNOSIS — R683 Clubbing of fingers: Secondary | ICD-10-CM | POA: Diagnosis not present

## 2016-10-13 DIAGNOSIS — M0579 Rheumatoid arthritis with rheumatoid factor of multiple sites without organ or systems involvement: Secondary | ICD-10-CM

## 2016-10-13 DIAGNOSIS — Z79899 Other long term (current) drug therapy: Secondary | ICD-10-CM | POA: Diagnosis not present

## 2016-10-13 DIAGNOSIS — R918 Other nonspecific abnormal finding of lung field: Secondary | ICD-10-CM | POA: Insufficient documentation

## 2016-10-13 NOTE — Telephone Encounter (Signed)
Patient was advised to increase MTX from 4 pills per week, but does not know what she was to increase to. Please call to advise. Leave a message with spouse if patient not there.

## 2016-10-13 NOTE — Telephone Encounter (Signed)
Patient advised to increase her dose to 6 tabs of MTX  And reviewed lab schedule with her.

## 2016-10-14 NOTE — Progress Notes (Signed)
Please notify patient an order CT scan of chest with contrast.

## 2016-10-15 ENCOUNTER — Telehealth: Payer: Self-pay | Admitting: *Deleted

## 2016-10-15 DIAGNOSIS — J9811 Atelectasis: Secondary | ICD-10-CM

## 2016-10-15 NOTE — Telephone Encounter (Signed)
-----   Message from Bo Merino, MD sent at 10/14/2016 11:30 AM EDT ----- Please notify patient an order CT scan of chest with contrast.

## 2016-10-28 ENCOUNTER — Telehealth: Payer: Self-pay | Admitting: Rheumatology

## 2016-10-28 ENCOUNTER — Other Ambulatory Visit: Payer: Self-pay | Admitting: *Deleted

## 2016-10-28 DIAGNOSIS — Z79899 Other long term (current) drug therapy: Secondary | ICD-10-CM | POA: Diagnosis not present

## 2016-10-28 DIAGNOSIS — Z961 Presence of intraocular lens: Secondary | ICD-10-CM | POA: Diagnosis not present

## 2016-10-28 DIAGNOSIS — M069 Rheumatoid arthritis, unspecified: Secondary | ICD-10-CM | POA: Diagnosis not present

## 2016-10-28 NOTE — Telephone Encounter (Signed)
Lab orders released.  

## 2016-10-28 NOTE — Telephone Encounter (Signed)
Patient at [facility] in [place] for labs now. Please send orders.

## 2016-10-29 ENCOUNTER — Telehealth: Payer: Self-pay | Admitting: Radiology

## 2016-10-29 ENCOUNTER — Ambulatory Visit (HOSPITAL_COMMUNITY)
Admission: RE | Admit: 2016-10-29 | Discharge: 2016-10-29 | Disposition: A | Discharge status: Home / Self Care | Payer: Medicare HMO | Source: Ambulatory Visit | Attending: Rheumatology | Admitting: Rheumatology

## 2016-10-29 DIAGNOSIS — J9811 Atelectasis: Secondary | ICD-10-CM

## 2016-10-29 DIAGNOSIS — I2510 Atherosclerotic heart disease of native coronary artery without angina pectoris: Secondary | ICD-10-CM | POA: Insufficient documentation

## 2016-10-29 DIAGNOSIS — J439 Emphysema, unspecified: Secondary | ICD-10-CM | POA: Diagnosis not present

## 2016-10-29 DIAGNOSIS — I700 Atherosclerosis of aorta: Secondary | ICD-10-CM | POA: Insufficient documentation

## 2016-10-29 DIAGNOSIS — R918 Other nonspecific abnormal finding of lung field: Secondary | ICD-10-CM | POA: Diagnosis not present

## 2016-10-29 LAB — CMP14+EGFR
A/G RATIO: 1.3 (ref 1.2–2.2)
ALK PHOS: 131 IU/L — AB (ref 39–117)
ALT: 10 IU/L (ref 0–32)
AST: 18 IU/L (ref 0–40)
Albumin: 3.5 g/dL — ABNORMAL LOW (ref 3.6–4.8)
BUN/Creatinine Ratio: 14 (ref 12–28)
BUN: 13 mg/dL (ref 8–27)
CHLORIDE: 99 mmol/L (ref 96–106)
CO2: 23 mmol/L (ref 20–29)
Calcium: 9 mg/dL (ref 8.7–10.3)
Creatinine, Ser: 0.94 mg/dL (ref 0.57–1.00)
GFR calc Af Amer: 73 mL/min/{1.73_m2} (ref >59)
GFR, EST NON AFRICAN AMERICAN: 63 mL/min/{1.73_m2} (ref >59)
GLOBULIN, TOTAL: 2.8 g/dL (ref 1.5–4.5)
Glucose: 101 mg/dL — ABNORMAL HIGH (ref 65–99)
POTASSIUM: 4 mmol/L (ref 3.5–5.2)
Sodium: 138 mmol/L (ref 134–144)
Total Protein: 6.3 g/dL (ref 6.0–8.5)

## 2016-10-29 LAB — CBC WITH DIFFERENTIAL/PLATELET
BASOS ABS: 0 10*3/uL (ref 0.0–0.2)
BASOS: 0 %
EOS (ABSOLUTE): 0.1 10*3/uL (ref 0.0–0.4)
Eos: 1 %
Hematocrit: 33.1 % — ABNORMAL LOW (ref 34.0–46.6)
Hemoglobin: 10.8 g/dL — ABNORMAL LOW (ref 11.1–15.9)
IMMATURE GRANULOCYTES: 0 %
Immature Grans (Abs): 0 10*3/uL (ref 0.0–0.1)
Lymphocytes Absolute: 1.4 10*3/uL (ref 0.7–3.1)
Lymphs: 18 %
MCH: 29.6 pg (ref 26.6–33.0)
MCHC: 32.6 g/dL (ref 31.5–35.7)
MCV: 91 fL (ref 79–97)
MONOS ABS: 0.8 10*3/uL (ref 0.1–0.9)
Monocytes: 10 %
NEUTROS PCT: 71 %
Neutrophils Absolute: 5.5 10*3/uL (ref 1.4–7.0)
PLATELETS: 340 10*3/uL (ref 150–379)
RBC: 3.65 x10E6/uL — ABNORMAL LOW (ref 3.77–5.28)
RDW: 16 % — AB (ref 12.3–15.4)
WBC: 7.7 10*3/uL (ref 3.4–10.8)

## 2016-10-29 NOTE — Progress Notes (Signed)
Please reschedule an appointment with Dr. Chase Caller or Dr. Lake Bells. Notify patient of CT results.

## 2016-10-29 NOTE — Telephone Encounter (Signed)
Called patient to advise  °

## 2016-10-29 NOTE — Telephone Encounter (Signed)
-----  Message from Candice Camp, RT sent at 10/29/2016 12:19 PM EDT ----- Add on vitamin D for elevated Alk phos

## 2016-10-30 ENCOUNTER — Other Ambulatory Visit: Payer: Self-pay | Admitting: *Deleted

## 2016-10-30 ENCOUNTER — Telehealth: Payer: Self-pay | Admitting: Radiology

## 2016-10-30 DIAGNOSIS — R918 Other nonspecific abnormal finding of lung field: Secondary | ICD-10-CM

## 2016-10-30 NOTE — Telephone Encounter (Signed)
Unable to add on the Vitamin D for her, at [place], patient states her Vitamin D was just checked a couple months ago at [place] and was low.

## 2016-10-30 NOTE — Telephone Encounter (Signed)
-----   Message from Bo Merino, MD sent at 10/29/2016  5:13 PM EDT ----- Please reschedule an appointment with Dr. Chase Caller or Dr. Lake Bells. Notify patient of CT results.

## 2016-11-12 ENCOUNTER — Encounter: Payer: Self-pay | Admitting: Internal Medicine

## 2016-11-12 ENCOUNTER — Ambulatory Visit (INDEPENDENT_AMBULATORY_CARE_PROVIDER_SITE_OTHER): Payer: Medicare HMO | Admitting: Internal Medicine

## 2016-11-12 VITALS — BP 94/62 | HR 95 | Ht 64.0 in | Wt 185.6 lb

## 2016-11-12 DIAGNOSIS — R590 Localized enlarged lymph nodes: Secondary | ICD-10-CM

## 2016-11-12 DIAGNOSIS — R918 Other nonspecific abnormal finding of lung field: Secondary | ICD-10-CM | POA: Diagnosis not present

## 2016-11-12 DIAGNOSIS — Z79899 Other long term (current) drug therapy: Secondary | ICD-10-CM

## 2016-11-12 DIAGNOSIS — Z8739 Personal history of other diseases of the musculoskeletal system and connective tissue: Secondary | ICD-10-CM | POA: Diagnosis not present

## 2016-11-12 DIAGNOSIS — Z87891 Personal history of nicotine dependence: Secondary | ICD-10-CM | POA: Diagnosis not present

## 2016-11-12 NOTE — Addendum Note (Signed)
Addended by: Della Goo C on: 11/12/2016 11:39 AM   Modules accepted: Orders

## 2016-11-12 NOTE — Progress Notes (Signed)
Subjective:    Patient ID: Rose Lutz, female    DOB: [birth year], 66 y.o.   MRN: [id number]  HPI  OV 11/12/2016  Chief Complaint  Patient presents with  . Advice Only    Referred by Dr. Estanislado Pandy due to a lung mass. Pt denies any cough, SOB, or CP.     66 year old female referred for evaluation of lung mass. She has a long-standing history of rheumatoid arthritis since the age of 39 which is approximately 27 years ago. She is to live in [place], [place]. She also has heavy smoking history for 45 pack. 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 Prior to moving to [place], [place] one half years ago she was on TNF alpha blockade. Since moving here establishing with Dr. Bronson Curb for rheumatoid arthritis she's been on methotrexate. By and large her rheumatoid arthritis is well controlled except in September 2018 according to chart review she had a flareup. Chest x-ray showed a left upper lobe mass. This a CT scan of the chest results are documented below. I personally visualized the CT results to confirm it and agree with the findings. She specifically stated that she has no respiratory complaints of shortness of breath or dyspnea or wheeze  IMPRESSION: 1. Multiple unusual pulmonary nodules and masses in the left lung, the largest of which is in the posterior aspect of the left upper lobe, with potential internal necrosis. There may be some associated left hilar lymphadenopathy. Certainly, these findings are concerning for neoplasm. However, given the patient's history of rheumatoid arthritis, the possibility of necrobiotic nodules should also be considered. As either etiology would be hypermetabolic on PET-CT, PET-CT is not recommended at this time. Further evaluation with bronchoscopy is suggested. 2. Mild dilatation of the pulmonic trunk (3.6 cm in diameter), suggestive of pulmonary arterial  hypertension. 3. Mild diffuse bronchial wall thickening with mild centrilobular and paraseptal emphysema; imaging findings suggestive of underlying COPD. 4. Aortic atherosclerosis, in addition to left main and 3 vessel coronary artery disease. Please note that although the presence of coronary artery calcium documents the presence of coronary artery disease, the severity of this disease and any potential stenosis cannot be assessed on this non-gated CT examination. Assessment for potential risk factor modification, dietary therapy or pharmacologic therapy may be warranted, if clinically indicated. These results will be called to the ordering clinician or representative by the Radiologist Assistant, and communication documented in the PACS or zVision Dashboard.  Aortic Atherosclerosis (ICD10-I70.0) and Emphysema (ICD10-J43.9).   Electronically Signed   By: Vinnie Langton M.D.   On: 10/29/2016 15:34   has a past medical history of Asbestos exposure; Chronic pain; Essential hypertension; GERD (gastroesophageal reflux disease); Pulmonary asbestosis ([place]); and Rheumatoid arthritis ([place]).   reports that she quit smoking about 10 years ago. Her smoking use included Cigarettes. She started smoking about 48 years ago. She has a 47.50 pack-year smoking history. She has never used smokeless tobacco.  Past Surgical History:  Procedure Laterality Date  . ABDOMINAL HYSTERECTOMY    . BILATERAL OOPHORECTOMY    . CESAREAN SECTION     x3  . KNEE ARTHROPLASTY Right   . TONSILLECTOMY    . YAG LASER APPLICATION Right 19/37/9024   Procedure: YAG LASER APPLICATION;  Surgeon: Rutherford Guys, MD;  Location: [facility];  Service: Ophthalmology;  Laterality: Right;  . YAG LASER APPLICATION Left 09/73/5329   Procedure: YAG LASER APPLICATION;  Surgeon: Elta Guadeloupe  Gershon Crane, MD;  Location: [facility];  Service: Ophthalmology;  Laterality: Left;    Allergies  Allergen Reactions  . Quinine Derivatives Nausea And  Vomiting  . Sulfa Antibiotics Nausea And Vomiting  . Percocet [Oxycodone-Acetaminophen] Other (See Comments)    Headahces     There is no immunization history on file for this patient.  Family History  Problem Relation Age of Onset  . Breast cancer Mother   . Colon cancer Maternal Grandmother   . Pancreatic cancer Son      Current Outpatient Prescriptions:  .  amitriptyline (ELAVIL) 100 MG tablet, Take 100 mg by mouth at bedtime., Disp: , Rfl:  .  cetirizine (ZYRTEC) 10 MG tablet, Take 10 mg by mouth at bedtime., Disp: , Rfl:  .  diphenhydrAMINE (BENADRYL) 25 mg capsule, Take 50-100 mg by mouth every 6 (six) hours as needed for itching or allergies., Disp: , Rfl:  .  folic acid (FOLVITE) 1 MG tablet, Take 1 tablet (1 mg total) by mouth daily., Disp: 90 tablet, Rfl: 4 .  HYDROcodone-acetaminophen (NORCO) 10-325 MG tablet, , Disp: , Rfl:  .  hydroxychloroquine (PLAQUENIL) 200 MG tablet, Take 1 tablet (200 mg total) by mouth 2 (two) times daily. Monday through Friday, Disp: 120 tablet, Rfl: 1 .  losartan-hydrochlorothiazide (HYZAAR) 50-12.5 MG tablet, TAKE 1 TABLET EVERY DAY, Disp: 90 tablet, Rfl: 3 .  methotrexate (RHEUMATREX) 2.5 MG tablet, TAKE 6 TABLETS ONE TIME WEEKLY ON TUESDAYS. CAUTION: CHEMOTHERAPY. PROTECT FROM LIGHT., Disp: 48 tablet, Rfl: 0 .  montelukast (SINGULAIR) 10 MG tablet, Take 10 mg by mouth at bedtime. , Disp: , Rfl:  .  Multiple Vitamin (MULTIVITAMIN WITH MINERALS) TABS tablet, Take 1 tablet by mouth daily., Disp: , Rfl:  .  omeprazole (PRILOSEC) 20 MG capsule, Take 20 mg by mouth daily., Disp: , Rfl:  .  TURMERIC PO, Take by mouth., Disp: , Rfl:  .  vitamin C (ASCORBIC ACID) 250 MG tablet, Take 250 mg by mouth daily., Disp: , Rfl:  .  Vitamin D, Ergocalciferol, (DRISDOL) 50000 units CAPS capsule, Take 1 capsule (50,000 Units total) by mouth every 7 (seven) days., Disp: 12 capsule, Rfl: 0   Review of Systems  Constitutional: Negative for fever and unexpected  weight change.  HENT: Positive for sinus pressure, sneezing and sore throat. Negative for congestion, dental problem, ear pain, nosebleeds, postnasal drip, rhinorrhea and trouble swallowing.   Eyes: Negative for redness and itching.  Respiratory: Negative for cough, chest tightness, shortness of breath and wheezing.   Cardiovascular: Positive for palpitations and leg swelling.  Gastrointestinal: Negative for nausea and vomiting.  Genitourinary: Negative for dysuria.  Musculoskeletal: Positive for joint swelling.  Skin: Negative for rash.  Allergic/Immunologic: Negative.  Negative for environmental allergies, food allergies and immunocompromised state.  Neurological: Negative for headaches.  Hematological: Bruises/bleeds easily.  Psychiatric/Behavioral: Positive for dysphoric mood. The patient is nervous/anxious.        Objective:   Physical Exam  Constitutional: She is oriented to person, place, and time. She appears well-developed and well-nourished. No distress.  HENT:  Head: Normocephalic and atraumatic.  Right Ear: External ear normal.  Left Ear: External ear normal.  Mouth/Throat: Oropharynx is clear and moist. No oropharyngeal exudate.  Eyes: Pupils are equal, round, and reactive to light. Conjunctivae and EOM are normal. Right eye exhibits no discharge. Left eye exhibits no discharge. No scleral icterus.  Neck: Normal range of motion. Neck supple. No JVD present. No tracheal deviation present. No thyromegaly present.  Cardiovascular: Normal rate, regular rhythm, normal heart sounds and intact distal pulses.  Exam reveals no gallop and no friction rub.   No murmur heard. Pulmonary/Chest: Effort normal and breath sounds normal. No respiratory distress. She has no wheezes. She has no rales. She exhibits no tenderness.  Abdominal: Soft. Bowel sounds are normal. She exhibits no distension and no mass. There is no tenderness. There is no rebound and no guarding.  Musculoskeletal:  Normal range of motion. She exhibits no edema or tenderness.  Extensive clubbing +  Lymphadenopathy:    She has no cervical adenopathy.  Neurological: She is alert and oriented to person, place, and time. She has normal reflexes. No cranial nerve deficit. She exhibits normal muscle tone. Coordination normal.  Skin: Skin is warm and dry. No rash noted. She is not diaphoretic. No erythema. No pallor.  Psychiatric: She has a normal mood and affect. Her behavior is normal. Judgment and thought content normal.  Vitals reviewed.   Vitals:   11/12/16 1059  BP: 94/62  Pulse: 95  SpO2: 95%  Weight: 185 lb 9.6 oz (84.2 kg)  Height: 5\' 4"  (1.626 m)    Estimated body mass index is 31.86 kg/m as calculated from the following:   Height as of this encounter: 5\' 4"  (1.626 m).   Weight as of this encounter: 185 lb 9.6 oz (84.2 kg).       Assessment & Plan:     ICD-10-CM   1. Mass of left lung R91.8   2. Mediastinal adenopathy R59.0   3. Stopped smoking with greater than 40 pack year history Z87.891   4. Immunosuppression due to drug therapy Z79.899   5. History of rheumatoid arthritis Z87.39    PLAN  Do spirometry and dlco test Do PET scan  Followup Dr Lamonte Sakai next 1-2 weeks but after above to discuss PET results; likely needs EBUS/ENB Spirometry can help with copd Rx if any   Dr. Brand Males, M.D., [facility].C.P Pulmonary and Critical Care Medicine Staff Physician [place] [facility] Pager: [phone], If no answer or between  15:00h - 7:00h: call [phone]  11/12/2016 11:27 AM

## 2016-11-12 NOTE — Patient Instructions (Signed)
ICD-10-CM   1. Mass of left lung R91.8   2. Mediastinal adenopathy R59.0   3. Stopped smoking with greater than 40 pack year history Z87.891   4. Immunosuppression due to drug therapy Z79.899   5. History of rheumatoid arthritis Z87.39     Do spirometry and dlco test Do PET scan  Followup Dr Lamonte Sakai next 1-2 weeks but after above to discuss PET results

## 2016-11-13 ENCOUNTER — Telehealth: Payer: Self-pay | Admitting: Internal Medicine

## 2016-11-13 NOTE — Telephone Encounter (Signed)
Called to let pt know we had to change her appt from 11/15 to 11/1 at 1045 due to her needing to have a 30 min appt with RB per MR but spoke with pt's husband, Thayer Jew, who stated that pt was not at home.  Loistine Chance to have pt call us once she returns home so we can relay her appt info to her.

## 2016-11-14 ENCOUNTER — Telehealth: Payer: Self-pay | Admitting: Rheumatology

## 2016-11-14 NOTE — Telephone Encounter (Signed)
Patient states she received a phone call from our office as well as [employer] about knee injections but she would like to hold off on those injections right now due to financial issues.

## 2016-11-20 ENCOUNTER — Ambulatory Visit: Payer: Medicare HMO | Admitting: Emergency Medicine

## 2016-11-21 ENCOUNTER — Encounter (HOSPITAL_COMMUNITY)
Admission: RE | Admit: 2016-11-21 | Discharge: 2016-11-21 | Disposition: A | Discharge status: Home / Self Care | Payer: Medicare HMO | Source: Ambulatory Visit | Attending: Internal Medicine | Admitting: Internal Medicine

## 2016-11-21 ENCOUNTER — Ambulatory Visit (HOSPITAL_COMMUNITY): Payer: Medicare HMO

## 2016-11-21 DIAGNOSIS — R918 Other nonspecific abnormal finding of lung field: Secondary | ICD-10-CM | POA: Diagnosis not present

## 2016-11-21 LAB — GLUCOSE, CAPILLARY: Glucose-Capillary: 104 mg/dL — ABNORMAL HIGH (ref 65–99)

## 2016-11-21 MED ORDER — FLUDEOXYGLUCOSE F - 18 (FDG) INJECTION
9.4000 | Freq: Once | INTRAVENOUS | Status: AC | PRN
  Administered 2016-11-21: 9.4 via INTRAVENOUS

## 2016-11-24 ENCOUNTER — Other Ambulatory Visit: Payer: Self-pay | Admitting: Rheumatology

## 2016-11-25 NOTE — Telephone Encounter (Signed)
Last Visit: 10/10/16 Next Visit: 01/23/17 Labs: 10/28/16 Elevated Alk Phos.  131 previously 98  Okay to refill MTX?

## 2016-11-28 ENCOUNTER — Other Ambulatory Visit: Payer: Self-pay | Admitting: *Deleted

## 2016-11-28 ENCOUNTER — Telehealth: Payer: Self-pay | Admitting: Internal Medicine

## 2016-11-28 DIAGNOSIS — E041 Nontoxic single thyroid nodule: Secondary | ICD-10-CM

## 2016-11-28 NOTE — Telephone Encounter (Signed)
Called pt letting her know this information and that we needed to get her scheduled to have her thyroid biopsied. Will place order for this to be done.

## 2016-11-28 NOTE — Telephone Encounter (Signed)
Emily: let patient know that there is a thyroid swelling right side and left lung mass. Both need bioppsy. But first Is the thhyroid biopsy - please set this up with IR and in comments write Dr Chase Caller d/w Dr Jacqualyn Posey on 11/28/2016 . Dx: PET HOT thyroid nodule right  Robert: Can you ENB this? I d/w Dorthula Rue on 11/28/2016 and he said that access is a bit hard due to location behind scapula and so inicreased rrisk for complications.  Patient is seeing you end of Nov 2018 for results of ENB. But if you want to ENB earlier you may  Thanks  Dr. Brand Males, M.D., [facility].C.P Pulmonary and Critical Care Medicine Staff Physician [place] [facility] Pager: [phone], If no answer or between  15:00h - 7:00h: call [phone]  11/28/2016 3:08 PM

## 2016-11-28 NOTE — Progress Notes (Signed)
Called pt about her needing to have thyroid biopsied. Will place order. Nothing further needed at this time.

## 2016-12-01 NOTE — Addendum Note (Signed)
Addended by: Lorretta Harp on: 12/01/2016 03:01 PM   Modules accepted: Orders

## 2016-12-04 ENCOUNTER — Ambulatory Visit: Payer: Medicare HMO | Admitting: Emergency Medicine

## 2016-12-10 ENCOUNTER — Telehealth: Payer: Self-pay | Admitting: Internal Medicine

## 2016-12-10 ENCOUNTER — Other Ambulatory Visit: Payer: Self-pay | Admitting: *Deleted

## 2016-12-10 DIAGNOSIS — E041 Nontoxic single thyroid nodule: Secondary | ICD-10-CM

## 2016-12-10 NOTE — Telephone Encounter (Signed)
Received a call from [place] at [facility] stating that she has seen the order for pt to come in for a biopsy of her thyroid. Arena stated to me that pt needs to have an ultrasound of her thyroid first before they can biopsy it. Placed order for pt to have the ultrasound of her thyroid. Arena verbalized to me that she has seen the order and will call pt to have her come in.  Nothing further needed.

## 2016-12-17 ENCOUNTER — Other Ambulatory Visit: Payer: Self-pay | Admitting: Cardiology

## 2016-12-19 ENCOUNTER — Ambulatory Visit
Admission: RE | Admit: 2016-12-19 | Discharge: 2016-12-19 | Disposition: A | Discharge status: Home / Self Care | Payer: Medicare HMO | Source: Ambulatory Visit | Attending: Internal Medicine | Admitting: Internal Medicine

## 2016-12-19 ENCOUNTER — Other Ambulatory Visit: Payer: Medicare HMO

## 2016-12-19 ENCOUNTER — Ambulatory Visit: Payer: Medicare HMO | Admitting: Emergency Medicine

## 2016-12-19 ENCOUNTER — Encounter: Payer: Self-pay | Admitting: Emergency Medicine

## 2016-12-19 VITALS — BP 110/76 | HR 103 | Ht 64.0 in | Wt 177.0 lb

## 2016-12-19 DIAGNOSIS — E041 Nontoxic single thyroid nodule: Secondary | ICD-10-CM

## 2016-12-19 DIAGNOSIS — E042 Nontoxic multinodular goiter: Secondary | ICD-10-CM | POA: Diagnosis not present

## 2016-12-19 DIAGNOSIS — R918 Other nonspecific abnormal finding of lung field: Secondary | ICD-10-CM

## 2016-12-19 NOTE — Patient Instructions (Addendum)
We will work on setting up a navigational bronchoscopy to allow [place] to biopsy the abnormal areas in your left lung Lab work today Follow with Dr Lamonte Sakai in 1 month to follow up after the test

## 2016-12-19 NOTE — H&P (View-Only) (Signed)
Subjective:    Patient ID: Rose Lutz, female    DOB: 19-Nov-1950, 66 y.o.   MRN: [id number]  HPI 66 year old woman with history of rheumatoid arthritis, tobacco use (47 pack years), asbestos exposure.  Previously on immunotherapy but currently on methotrexate, plaquanil and followed by Dr. Bronson Curb.  She was seen by my colleague Dr. Chase Caller for evaluation of a left upper lobe mass.  Her CT scan of the chest from 10/29/16 showed multiple lung nodules on the left with the largest in the posterior aspect of the left upper lobe with some possible central clearing.  She also had some associated left hilar lymphadenopathy.  A subsequent PET scan on 11/21/16 showed that the posterior left upper lobe lesion is hypermetabolic.                                                            Her mother had TB. She had a positive TB skin test once but she cannot remember when, believes that she was treated for latent TB but she cannot remember how long she was treated. She has fatigue, but denies cough, sweats, fevers, hemoptysis.    Review of Systems  Past Medical History:  Diagnosis Date  . Asbestos exposure    Spot on lung  . Chronic pain    On hydrocodone  . Essential hypertension   . GERD (gastroesophageal reflux disease)   . Pulmonary asbestosis [facility])    Patient states mild  . Rheumatoid arthritis ([place])      Family History  Problem Relation Age of Onset  . Breast cancer Mother   . Colon cancer Maternal Grandmother   . Pancreatic cancer Son      Social History   Socioeconomic History  . Marital status: Married    Spouse name: Not on file  . Number of children: Not on file  . Years of education: Not on file  . Highest education level: Not on file  Social Needs  . Financial resource strain: Not on file  . Food insecurity - worry: Not on file  . Food insecurity - inability: Not on file  . Transportation needs - medical: Not on file  . Transportation needs - non-medical: Not  on file  Occupational History  . Not on file  Tobacco Use  . Smoking status: Former Smoker    Packs/day: 1.25    Years: 38.00    Pack years: 47.50    Types: Cigarettes    Start date: 01/21/1968    Last attempt to quit: 01/20/2006    Years since quitting: 10.9  . Smokeless tobacco: Never Used  Substance and Sexual Activity  . Alcohol use: No    Alcohol/week: 0.0 oz  . Drug use: No  . Sexual activity: Not on file  Other Topics Concern  . Not on file  Social History Narrative  . Not on file  Worked for [employer], was exposed to asbestosis.  Her mother had TB  Allergies  Allergen Reactions  . Quinine Derivatives Nausea And Vomiting  . Sulfa Antibiotics Nausea And Vomiting  . Percocet [Oxycodone-Acetaminophen] Other (See Comments)    Headahces     Outpatient Medications Prior to Visit  Medication Sig Dispense Refill  . amitriptyline (ELAVIL) 100 MG tablet Take 100 mg by mouth at bedtime.    Marland Kitchen  cetirizine (ZYRTEC) 10 MG tablet Take 10 mg by mouth at bedtime.    . diphenhydrAMINE (BENADRYL) 25 mg capsule Take 50-100 mg by mouth every 6 (six) hours as needed for itching or allergies.    . folic acid (FOLVITE) 1 MG tablet Take 1 tablet (1 mg total) by mouth daily. 90 tablet 4  . HYDROcodone-acetaminophen (NORCO) 10-325 MG tablet     . hydroxychloroquine (PLAQUENIL) 200 MG tablet Take 1 tablet (200 mg total) by mouth 2 (two) times daily. Monday through Friday 120 tablet 1  . losartan-hydrochlorothiazide (HYZAAR) 50-12.5 MG tablet TAKE 1 TABLET EVERY DAY 90 tablet 3  . methotrexate (RHEUMATREX) 2.5 MG tablet TAKE 6 TABLETS ONE TIME WEEKLY ON TUESDAYS. CAUTION: CHEMOTHERAPY. PROTECT FROM LIGHT. 48 tablet 0  . montelukast (SINGULAIR) 10 MG tablet Take 10 mg by mouth at bedtime.     . Multiple Vitamin (MULTIVITAMIN WITH MINERALS) TABS tablet Take 1 tablet by mouth daily.    Marland Kitchen omeprazole (PRILOSEC) 20 MG capsule Take 20 mg by mouth daily.    . TURMERIC PO Take by mouth.    . vitamin  C (ASCORBIC ACID) 250 MG tablet Take 250 mg by mouth daily.    . Vitamin D, Ergocalciferol, (DRISDOL) 50000 units CAPS capsule Take 1 capsule (50,000 Units total) by mouth every 7 (seven) days. 12 capsule 0   No facility-administered medications prior to visit.         Objective:   Physical Exam Vitals:   12/19/16 1619  BP: 110/76  Pulse: (!) 103  SpO2: 96%  Weight: 177 lb (80.3 kg)  Height: 5\' 4"  (1.626 m)   Gen: Pleasant, well-nourished, in no distress,  normal affect  ENT: No lesions,  mouth clear,  oropharynx clear, no postnasal drip  Neck: No JVD, no TMG, no carotid bruits  Lungs: No use of accessory muscles, clear without rales or rhonchi  Cardiovascular: RRR, heart sounds normal, no murmur or gallops, no peripheral edema  Musculoskeletal: No deformities, no cyanosis or clubbing  Neuro: alert, non focal  Skin: Warm, no lesions or rashes      Assessment & Plan:  Mass of left lung Etiology unclear but size and hypermetabolism on PET scan are very worrisome for possible malignancy.  She does have a history of significant asbestos and tobacco exposure.  She is also immunosuppressed for her rheumatoid arthritis with methotrexate and Plaquenil.  She is at risk for opportunistic infection including possibly TB, for which she apparently has a remote positive skin test, or fungal organisms.  She has had no fever, chills, cough, hemoptysis.  No weight loss or sweats.  I will check a QuantiFERON gold, Aspergillus galactomannan assay.  I believe she needs a left lung biopsy and culture data.  The best strategy will be via navigational bronchoscopy and we will arrange for this.  Baltazar Apo, MD, PhD 12/19/2016, 4:58 PM  Pulmonary and Critical Care [phone] or if no answer [phone]

## 2016-12-19 NOTE — Progress Notes (Signed)
Subjective:    Patient ID: Rose Lutz, female    DOB: October 09, 1950, 66 y.o.   MRN: [id number]  HPI 66 year old woman with history of rheumatoid arthritis, tobacco use (47 pack years), asbestos exposure.  Previously on immunotherapy but currently on methotrexate, plaquanil and followed by Dr. Bronson Curb.  She was seen by my colleague Dr. Chase Caller for evaluation of a left upper lobe mass.  Her CT scan of the chest from 10/29/16 showed multiple lung nodules on the left with the largest in the posterior aspect of the left upper lobe with some possible central clearing.  She also had some associated left hilar lymphadenopathy.  A subsequent PET scan on 11/21/16 showed that the posterior left upper lobe lesion is hypermetabolic.                                                            Her mother had TB. She had a positive TB skin test once but she cannot remember when, believes that she was treated for latent TB but she cannot remember how long she was treated. She has fatigue, but denies cough, sweats, fevers, hemoptysis.    Review of Systems  Past Medical History:  Diagnosis Date  . Asbestos exposure    Spot on lung  . Chronic pain    On hydrocodone  . Essential hypertension   . GERD (gastroesophageal reflux disease)   . Pulmonary asbestosis [facility])    Patient states mild  . Rheumatoid arthritis ([place])      Family History  Problem Relation Age of Onset  . Breast cancer Mother   . Colon cancer Maternal Grandmother   . Pancreatic cancer Son      Social History   Socioeconomic History  . Marital status: Married    Spouse name: Not on file  . Number of children: Not on file  . Years of education: Not on file  . Highest education level: Not on file  Social Needs  . Financial resource strain: Not on file  . Food insecurity - worry: Not on file  . Food insecurity - inability: Not on file  . Transportation needs - medical: Not on file  . Transportation needs - non-medical: Not  on file  Occupational History  . Not on file  Tobacco Use  . Smoking status: Former Smoker    Packs/day: 1.25    Years: 38.00    Pack years: 47.50    Types: Cigarettes    Start date: 01/21/1968    Last attempt to quit: 01/20/2006    Years since quitting: 10.9  . Smokeless tobacco: Never Used  Substance and Sexual Activity  . Alcohol use: No    Alcohol/week: 0.0 oz  . Drug use: No  . Sexual activity: Not on file  Other Topics Concern  . Not on file  Social History Narrative  . Not on file  Worked for [employer], was exposed to asbestosis.  Her mother had TB  Allergies  Allergen Reactions  . Quinine Derivatives Nausea And Vomiting  . Sulfa Antibiotics Nausea And Vomiting  . Percocet [Oxycodone-Acetaminophen] Other (See Comments)    Headahces     Outpatient Medications Prior to Visit  Medication Sig Dispense Refill  . amitriptyline (ELAVIL) 100 MG tablet Take 100 mg by mouth at bedtime.    Marland Kitchen  cetirizine (ZYRTEC) 10 MG tablet Take 10 mg by mouth at bedtime.    . diphenhydrAMINE (BENADRYL) 25 mg capsule Take 50-100 mg by mouth every 6 (six) hours as needed for itching or allergies.    . folic acid (FOLVITE) 1 MG tablet Take 1 tablet (1 mg total) by mouth daily. 90 tablet 4  . HYDROcodone-acetaminophen (NORCO) 10-325 MG tablet     . hydroxychloroquine (PLAQUENIL) 200 MG tablet Take 1 tablet (200 mg total) by mouth 2 (two) times daily. Monday through Friday 120 tablet 1  . losartan-hydrochlorothiazide (HYZAAR) 50-12.5 MG tablet TAKE 1 TABLET EVERY DAY 90 tablet 3  . methotrexate (RHEUMATREX) 2.5 MG tablet TAKE 6 TABLETS ONE TIME WEEKLY ON TUESDAYS. CAUTION: CHEMOTHERAPY. PROTECT FROM LIGHT. 48 tablet 0  . montelukast (SINGULAIR) 10 MG tablet Take 10 mg by mouth at bedtime.     . Multiple Vitamin (MULTIVITAMIN WITH MINERALS) TABS tablet Take 1 tablet by mouth daily.    Marland Kitchen omeprazole (PRILOSEC) 20 MG capsule Take 20 mg by mouth daily.    . TURMERIC PO Take by mouth.    . vitamin  C (ASCORBIC ACID) 250 MG tablet Take 250 mg by mouth daily.    . Vitamin D, Ergocalciferol, (DRISDOL) 50000 units CAPS capsule Take 1 capsule (50,000 Units total) by mouth every 7 (seven) days. 12 capsule 0   No facility-administered medications prior to visit.         Objective:   Physical Exam Vitals:   12/19/16 1619  BP: 110/76  Pulse: (!) 103  SpO2: 96%  Weight: 177 lb (80.3 kg)  Height: 5\' 4"  (1.626 m)   Gen: Pleasant, well-nourished, in no distress,  normal affect  ENT: No lesions,  mouth clear,  oropharynx clear, no postnasal drip  Neck: No JVD, no TMG, no carotid bruits  Lungs: No use of accessory muscles, clear without rales or rhonchi  Cardiovascular: RRR, heart sounds normal, no murmur or gallops, no peripheral edema  Musculoskeletal: No deformities, no cyanosis or clubbing  Neuro: alert, non focal  Skin: Warm, no lesions or rashes      Assessment & Plan:  Mass of left lung Etiology unclear but size and hypermetabolism on PET scan are very worrisome for possible malignancy.  She does have a history of significant asbestos and tobacco exposure.  She is also immunosuppressed for her rheumatoid arthritis with methotrexate and Plaquenil.  She is at risk for opportunistic infection including possibly TB, for which she apparently has a remote positive skin test, or fungal organisms.  She has had no fever, chills, cough, hemoptysis.  No weight loss or sweats.  I will check a QuantiFERON gold, Aspergillus galactomannan assay.  I believe she needs a left lung biopsy and culture data.  The best strategy will be via navigational bronchoscopy and we will arrange for this.  Baltazar Apo, MD, PhD 12/19/2016, 4:58 PM [place] [facility] [phone] or if no answer [phone]

## 2016-12-19 NOTE — Assessment & Plan Note (Signed)
Etiology unclear but size and hypermetabolism on PET scan are very worrisome for possible malignancy.  She does have a history of significant asbestos and tobacco exposure.  She is also immunosuppressed for her rheumatoid arthritis with methotrexate and Plaquenil.  She is at risk for opportunistic infection including possibly TB, for which she apparently has a remote positive skin test, or fungal organisms.  She has had no fever, chills, cough, hemoptysis.  No weight loss or sweats.  I will check a QuantiFERON gold, Aspergillus galactomannan assay.  I believe she needs a left lung biopsy and culture data.  The best strategy will be via navigational bronchoscopy and we will arrange for this.

## 2016-12-25 ENCOUNTER — Other Ambulatory Visit: Payer: Self-pay | Admitting: *Deleted

## 2016-12-25 DIAGNOSIS — Z79899 Other long term (current) drug therapy: Secondary | ICD-10-CM

## 2016-12-25 DIAGNOSIS — R918 Other nonspecific abnormal finding of lung field: Secondary | ICD-10-CM | POA: Diagnosis not present

## 2016-12-25 DIAGNOSIS — E663 Overweight: Secondary | ICD-10-CM | POA: Diagnosis not present

## 2016-12-25 DIAGNOSIS — F320 Major depressive disorder, single episode, mild: Secondary | ICD-10-CM | POA: Diagnosis not present

## 2016-12-25 DIAGNOSIS — E6609 Other obesity due to excess calories: Secondary | ICD-10-CM | POA: Diagnosis not present

## 2016-12-25 DIAGNOSIS — Z1389 Encounter for screening for other disorder: Secondary | ICD-10-CM | POA: Diagnosis not present

## 2016-12-25 DIAGNOSIS — Z6830 Body mass index (BMI) 30.0-30.9, adult: Secondary | ICD-10-CM | POA: Diagnosis not present

## 2016-12-25 DIAGNOSIS — M069 Rheumatoid arthritis, unspecified: Secondary | ICD-10-CM | POA: Diagnosis not present

## 2016-12-25 NOTE — Addendum Note (Signed)
Addended by: Carole Binning on: 12/25/2016 03:15 PM   Modules accepted: Orders

## 2016-12-25 NOTE — Pre-Procedure Instructions (Signed)
Rose Lutz  12/25/2016      [place], [place] [address] Phone: [phone] Fax: [phone]  [facility], [place] - [address] AT [address] [place]. [address] [place] [address] Phone: [phone] Fax: [phone]    Your procedure is scheduled on Wed.Dec. 12  Report to [facility] Admitting at 6:30 A.M.  Call this number if you have problems the morning of surgery:  [phone]   Remember:  Do not eat food or drink liquids after midnight on Tues. Dec. 11   Take these medicines the morning of surgery with A SIP OF WATER :benadryl if needed, hydrocodone if needed, omeprazole (prilosec),  7 days prior to surgery STOP taking any Aspirin(unless otherwise instructed by your surgeon), Aleve, Naproxen, Ibuprofen, Motrin, Advil, Goody's, BC's, all herbal medications, fish oil, and all vitamins   Do not wear jewelry, make-up or nail polish.  Do not wear lotions, powders, or perfumes, or deoderant.  Do not shave 48 hours prior to surgery.  Men may shave face and neck.  Do not bring valuables to the hospital.  [facility] is not responsible for any belongings or valuables.  Contacts, dentures or bridgework may not be worn into surgery.  Leave your suitcase in the car.  After surgery it may be brought to your room.  For patients admitted to the hospital, discharge time will be determined by your treatment team.  Patients discharged the day of surgery will not be allowed to drive home.    Special instructions:   [facility]- Preparing For Surgery  Before surgery, you can play an important role. Because skin is not sterile, your skin needs to be as free of germs as possible. You can reduce the number of germs on your skin by washing with CHG (chlorahexidine gluconate) Soap before surgery.  CHG is an antiseptic cleaner which  kills germs and bonds with the skin to continue killing germs even after washing.  Please do not use if you have an allergy to CHG or antibacterial soaps. If your skin becomes reddened/irritated stop using the CHG.  Do not shave (including legs and underarms) for at least 48 hours prior to first CHG shower. It is OK to shave your face.  Please follow these instructions carefully.   1. Shower the NIGHT BEFORE SURGERY and the MORNING OF SURGERY with CHG.   2. If you chose to wash your hair, wash your hair first as usual with your normal shampoo.  3. After you shampoo, rinse your hair and body thoroughly to remove the shampoo.  4. Use CHG as you would any other liquid soap. You can apply CHG directly to the skin and wash gently with a scrungie or a clean washcloth.   5. Apply the CHG Soap to your body ONLY FROM THE NECK DOWN.  Do not use on open wounds or open sores. Avoid contact with your eyes, ears, mouth and genitals (private parts). Wash Face and genitals (private parts)  with your normal soap.  6. Wash thoroughly, paying special attention to the area where your surgery will be performed.  7. Thoroughly rinse your body with warm water from the neck down.  8. DO NOT shower/wash with your normal soap after using and rinsing off the CHG Soap.  9. Pat yourself dry with a CLEAN TOWEL.  10. Wear CLEAN PAJAMAS to bed the night before surgery, wear comfortable clothes the morning of surgery  11. Place CLEAN SHEETS on your bed the night of your first shower and DO NOT SLEEP WITH PETS.    Day of Surgery: Do not apply any deodorants/lotions. Please wear clean clothes to the hospital/surgery center.      Please read over the following fact sheets that you were given. Coughing and Deep Breathing and Surgical Site Infection Prevention

## 2016-12-26 ENCOUNTER — Encounter (HOSPITAL_COMMUNITY): Payer: Self-pay

## 2016-12-26 ENCOUNTER — Ambulatory Visit (HOSPITAL_COMMUNITY)
Admission: RE | Admit: 2016-12-26 | Discharge: 2016-12-26 | Disposition: A | Discharge status: Home / Self Care | Payer: Medicare HMO | Source: Ambulatory Visit | Attending: Emergency Medicine | Admitting: Emergency Medicine

## 2016-12-26 ENCOUNTER — Other Ambulatory Visit: Payer: Self-pay

## 2016-12-26 DIAGNOSIS — Z01818 Encounter for other preprocedural examination: Secondary | ICD-10-CM | POA: Insufficient documentation

## 2016-12-26 DIAGNOSIS — R918 Other nonspecific abnormal finding of lung field: Secondary | ICD-10-CM | POA: Diagnosis not present

## 2016-12-26 HISTORY — DX: Depression, unspecified: F32.A

## 2016-12-26 HISTORY — DX: Major depressive disorder, single episode, unspecified: F32.9

## 2016-12-26 LAB — CMP14+EGFR
ALBUMIN: 3.6 g/dL (ref 3.6–4.8)
ALT: 7 IU/L (ref 0–32)
AST: 14 IU/L (ref 0–40)
Albumin/Globulin Ratio: 1.2 (ref 1.2–2.2)
Alkaline Phosphatase: 130 IU/L — ABNORMAL HIGH (ref 39–117)
BILIRUBIN TOTAL: 0.3 mg/dL (ref 0.0–1.2)
BUN / CREAT RATIO: 10 — AB (ref 12–28)
BUN: 9 mg/dL (ref 8–27)
CHLORIDE: 99 mmol/L (ref 96–106)
CO2: 24 mmol/L (ref 20–29)
Calcium: 9.2 mg/dL (ref 8.7–10.3)
Creatinine, Ser: 0.92 mg/dL (ref 0.57–1.00)
GFR calc Af Amer: 75 mL/min/{1.73_m2} (ref >59)
GFR calc non Af Amer: 65 mL/min/{1.73_m2} (ref >59)
GLOBULIN, TOTAL: 2.9 g/dL (ref 1.5–4.5)
GLUCOSE: 98 mg/dL (ref 65–99)
Potassium: 4 mmol/L (ref 3.5–5.2)
SODIUM: 139 mmol/L (ref 134–144)
TOTAL PROTEIN: 6.5 g/dL (ref 6.0–8.5)

## 2016-12-26 LAB — CBC WITH DIFFERENTIAL/PLATELET
BASOS ABS: 0 10*3/uL (ref 0.0–0.2)
Basos: 0 %
EOS (ABSOLUTE): 0 10*3/uL (ref 0.0–0.4)
EOS: 1 %
HEMATOCRIT: 33 % — AB (ref 34.0–46.6)
HEMOGLOBIN: 10.3 g/dL — AB (ref 11.1–15.9)
Immature Grans (Abs): 0 10*3/uL (ref 0.0–0.1)
Immature Granulocytes: 0 %
LYMPHS ABS: 1.7 10*3/uL (ref 0.7–3.1)
Lymphs: 21 %
MCH: 27.9 pg (ref 26.6–33.0)
MCHC: 31.2 g/dL — AB (ref 31.5–35.7)
MCV: 89 fL (ref 79–97)
MONOCYTES: 8 %
Monocytes Absolute: 0.6 10*3/uL (ref 0.1–0.9)
NEUTROS ABS: 5.5 10*3/uL (ref 1.4–7.0)
Neutrophils: 70 %
Platelets: 378 10*3/uL (ref 150–379)
RBC: 3.69 x10E6/uL — ABNORMAL LOW (ref 3.77–5.28)
RDW: 16.2 % — ABNORMAL HIGH (ref 12.3–15.4)
WBC: 7.9 10*3/uL (ref 3.4–10.8)

## 2016-12-26 LAB — COMPREHENSIVE METABOLIC PANEL
ALT: 11 U/L — ABNORMAL LOW (ref 14–54)
ANION GAP: 9 (ref 5–15)
AST: 17 U/L (ref 15–41)
Albumin: 3 g/dL — ABNORMAL LOW (ref 3.5–5.0)
Alkaline Phosphatase: 117 U/L (ref 38–126)
BILIRUBIN TOTAL: 0.4 mg/dL (ref 0.3–1.2)
BUN: 10 mg/dL (ref 6–20)
CO2: 25 mmol/L (ref 22–32)
Calcium: 8.8 mg/dL — ABNORMAL LOW (ref 8.9–10.3)
Chloride: 102 mmol/L (ref 101–111)
Creatinine, Ser: 0.95 mg/dL (ref 0.44–1.00)
GFR calc Af Amer: >60 mL/min (ref >60)
Glucose, Bld: 107 mg/dL — ABNORMAL HIGH (ref 65–99)
POTASSIUM: 3.5 mmol/L (ref 3.5–5.1)
Sodium: 136 mmol/L (ref 135–145)
TOTAL PROTEIN: 6.4 g/dL — AB (ref 6.5–8.1)

## 2016-12-26 LAB — CBC
HEMATOCRIT: 32.5 % — AB (ref 36.0–46.0)
Hemoglobin: 10.2 g/dL — ABNORMAL LOW (ref 12.0–15.0)
MCH: 28.1 pg (ref 26.0–34.0)
MCHC: 31.4 g/dL (ref 30.0–36.0)
MCV: 89.5 fL (ref 78.0–100.0)
Platelets: 392 10*3/uL (ref 150–400)
RBC: 3.63 MIL/uL — ABNORMAL LOW (ref 3.87–5.11)
RDW: 16.5 % — AB (ref 11.5–15.5)
WBC: 8.3 10*3/uL (ref 4.0–10.5)

## 2016-12-26 LAB — APTT: aPTT: 30 seconds (ref 24–36)

## 2016-12-26 LAB — PROTIME-INR
INR: 1.07
Prothrombin Time: 13.8 seconds (ref 11.4–15.2)

## 2016-12-26 NOTE — Progress Notes (Signed)
Labs are stable. Still with anemia. Please forward labs to her PCP.

## 2016-12-26 NOTE — Progress Notes (Addendum)
TUU:EKCMKLK [employer] @ [facility] in [employer], notes  Rheumatologist: Dr. Raul Del. To contact her if she should stop methtrexate  Or continue prior to surgery.  Pt. Has several rings on left hand ,she reports can't get them off. Sometimes she can get only one off. Instructed her to try and get them off prior to surgery or go to a jewelry store and have the rings resize . Informed her if she can't get them off,and her fingers/hand start to swell during hospital visit the rings would be cut off. Pt. Understood.

## 2016-12-29 ENCOUNTER — Inpatient Hospital Stay (HOSPITAL_COMMUNITY)
Admission: RE | Admit: 2016-12-29 | Discharge: 2016-12-29 | Disposition: A | Discharge status: Home / Self Care | Payer: Medicare HMO | Source: Ambulatory Visit

## 2016-12-29 NOTE — Progress Notes (Signed)
Anesthesia Chart Review:  Pt is a 66 year old female scheduled for video bronchoscopy with endobronchial navigation on 12/31/2016 with Baltazar Apo, MD  - PCP is Delman Cheadle, [place] - Saw cardiologist Rozann Lesches, MD 02/06/15 for dizziness. Determined to be orthostasis, BP meds reduced. Echo ordered, results below. F/u with PCP recommended.   PMH includes:  HTN, pulmonary asbestosis, RA, GERD. Former smoker. BMI 31  Medications include: plaquenil, methotrexate, prilosec. Pt to contact rheumatologist for instructions regarding RA meds perioperatively.   BP 113/77   Pulse 88   Temp 36.5 C   Resp 20   Ht 5\' 4"  (1.626 m)   Wt 179 lb 3.2 oz (81.3 kg)   SpO2 97%   BMI 30.76 kg/m   Preoperative labs reviewed.    CXR 10/13/16:  1. Hazy opacities in the left upper and mid lung of uncertain etiology. This may be secondary to an infectious or inflammatory etiology versus neoplasm. Recommend further evaluation with a CT of the chest. 2. Hazy lingular airspace disease which may reflect atelectasis versus pneumonia versus scarring.  EKG requested from PCP's office. If it does not arrive, EKG will be obtained day of surgery  CT chest 10/29/16:  1. Multiple unusual pulmonary nodules and masses in the left lung, the largest of which is in the posterior aspect of the left upper lobe, with potential internal necrosis. There may be some associated left hilar lymphadenopathy. Certainly, these findings are concerning for neoplasm. However, given the patient's history of rheumatoid arthritis, the possibility of necrobiotic nodules should also be considered. 2. Mild dilatation of the pulmonic trunk (3.6 cm in diameter), suggestive of pulmonary arterial hypertension. 3. Mild diffuse bronchial wall thickening with mild centrilobular and paraseptal emphysema; imaging findings suggestive of underlying COPD. 4. Aortic atherosclerosis, in addition to left main and 3 vessel coronary artery disease. Please note  that although the presence of coronary artery calcium documents the presence of coronary artery disease, the severity of this disease and any potential stenosis cannot be assessed on this non-gated CT examination. Assessment for potential risk factor modification, dietary therapy or pharmacologic therapy may be warranted, if clinically indicated.   Echo 02/13/15:  - Left ventricle: The cavity size was normal. Wall thickness was normal. Systolic function was normal. The estimated ejection fraction was in the range of 60% to 65%. Wall motion was normal; there were no regional wall motion abnormalities. Left ventricular diastolic function parameters were normal for the patient's age. - Mitral valve: There was trivial regurgitation. - Right atrium: Central venous pressure (est): 3 mm Hg. - Atrial septum: No defect or patent foramen ovale was identified. - Tricuspid valve: There was trivial regurgitation. - Pulmonary arteries: Systolic pressure could not be accurately estimated. - Pericardium, extracardiac: There was no pericardial effusion. - Impressions: Normal LV wall thickness with LVEF 60-65% and normal diastolic function. Trivial mitral and tricuspid regurgitation.  Pt has rings on her fingers she cannot get off due to RA.  Pre-admission testing RN recommended she see a jeweler about this and have them removed before surgery.  Pt understands rings may be cut off day of surgery.   If no changes, I anticipate pt can proceed with surgery as scheduled.   Willeen Cass, FNP-BC [facility]/Anesthesiology Phone: [phone] 12/29/2016 12:04 PM

## 2016-12-31 ENCOUNTER — Ambulatory Visit (HOSPITAL_COMMUNITY): Payer: Medicare HMO | Admitting: Emergency Medicine

## 2016-12-31 ENCOUNTER — Ambulatory Visit (HOSPITAL_COMMUNITY): Payer: Medicare HMO | Admitting: Anesthesiology

## 2016-12-31 ENCOUNTER — Encounter (HOSPITAL_COMMUNITY): Payer: Self-pay | Admitting: Anesthesiology

## 2016-12-31 ENCOUNTER — Ambulatory Visit (HOSPITAL_COMMUNITY): Payer: Medicare HMO

## 2016-12-31 ENCOUNTER — Ambulatory Visit (HOSPITAL_COMMUNITY)
Admission: RE | Admit: 2016-12-31 | Discharge: 2016-12-31 | Disposition: A | Discharge status: Home / Self Care | Payer: Medicare HMO | Source: Ambulatory Visit | Attending: Emergency Medicine | Admitting: Emergency Medicine

## 2016-12-31 ENCOUNTER — Encounter (HOSPITAL_COMMUNITY)
Admission: RE | Disposition: A | Discharge status: Home / Self Care | Payer: Self-pay | Source: Ambulatory Visit | Attending: Emergency Medicine

## 2016-12-31 DIAGNOSIS — G8929 Other chronic pain: Secondary | ICD-10-CM | POA: Diagnosis not present

## 2016-12-31 DIAGNOSIS — M1712 Unilateral primary osteoarthritis, left knee: Secondary | ICD-10-CM | POA: Diagnosis not present

## 2016-12-31 DIAGNOSIS — I10 Essential (primary) hypertension: Secondary | ICD-10-CM | POA: Insufficient documentation

## 2016-12-31 DIAGNOSIS — C3412 Malignant neoplasm of upper lobe, left bronchus or lung: Secondary | ICD-10-CM | POA: Insufficient documentation

## 2016-12-31 DIAGNOSIS — R918 Other nonspecific abnormal finding of lung field: Secondary | ICD-10-CM

## 2016-12-31 DIAGNOSIS — M069 Rheumatoid arthritis, unspecified: Secondary | ICD-10-CM | POA: Insufficient documentation

## 2016-12-31 DIAGNOSIS — R846 Abnormal cytological findings in specimens from respiratory organs and thorax: Secondary | ICD-10-CM | POA: Diagnosis not present

## 2016-12-31 DIAGNOSIS — Z79899 Other long term (current) drug therapy: Secondary | ICD-10-CM | POA: Diagnosis not present

## 2016-12-31 DIAGNOSIS — K219 Gastro-esophageal reflux disease without esophagitis: Secondary | ICD-10-CM | POA: Insufficient documentation

## 2016-12-31 DIAGNOSIS — Z87891 Personal history of nicotine dependence: Secondary | ICD-10-CM | POA: Diagnosis not present

## 2016-12-31 DIAGNOSIS — R42 Dizziness and giddiness: Secondary | ICD-10-CM | POA: Diagnosis not present

## 2016-12-31 DIAGNOSIS — Z9889 Other specified postprocedural states: Secondary | ICD-10-CM

## 2016-12-31 DIAGNOSIS — J61 Pneumoconiosis due to asbestos and other mineral fibers: Secondary | ICD-10-CM | POA: Diagnosis not present

## 2016-12-31 DIAGNOSIS — R911 Solitary pulmonary nodule: Secondary | ICD-10-CM | POA: Diagnosis present

## 2016-12-31 DIAGNOSIS — Z419 Encounter for procedure for purposes other than remedying health state, unspecified: Secondary | ICD-10-CM

## 2016-12-31 HISTORY — PX: VIDEO BRONCHOSCOPY WITH ENDOBRONCHIAL NAVIGATION: SHX6175

## 2016-12-31 SURGERY — VIDEO BRONCHOSCOPY WITH ENDOBRONCHIAL NAVIGATION
Anesthesia: General | Site: Chest | Laterality: Left

## 2016-12-31 MED ORDER — ONDANSETRON HCL 4 MG/2ML IJ SOLN
INTRAMUSCULAR | Status: AC
  Filled 2016-12-31: qty 2

## 2016-12-31 MED ORDER — ROCURONIUM BROMIDE 10 MG/ML (PF) SYRINGE
PREFILLED_SYRINGE | INTRAVENOUS | Status: AC
  Filled 2016-12-31: qty 5

## 2016-12-31 MED ORDER — HYDROMORPHONE HCL 1 MG/ML IJ SOLN: 0.2500 mg | INTRAMUSCULAR | Status: DC | PRN

## 2016-12-31 MED ORDER — HYDROXYCHLOROQUINE SULFATE 200 MG PO TABS: 400.0000 mg | ORAL_TABLET | Freq: Every evening | ORAL | Status: DC

## 2016-12-31 MED ORDER — SUGAMMADEX SODIUM 200 MG/2ML IV SOLN
INTRAVENOUS | Status: DC | PRN
  Administered 2016-12-31: 200 mg via INTRAVENOUS

## 2016-12-31 MED ORDER — ROCURONIUM BROMIDE 100 MG/10ML IV SOLN
INTRAVENOUS | Status: DC | PRN
  Administered 2016-12-31: 50 mg via INTRAVENOUS
  Administered 2016-12-31 (×2): 20 mg via INTRAVENOUS

## 2016-12-31 MED ORDER — MIDAZOLAM HCL 2 MG/2ML IJ SOLN
INTRAMUSCULAR | Status: AC
  Filled 2016-12-31: qty 2

## 2016-12-31 MED ORDER — MIDAZOLAM HCL 5 MG/5ML IJ SOLN
INTRAMUSCULAR | Status: DC | PRN
  Administered 2016-12-31: 2 mg via INTRAVENOUS

## 2016-12-31 MED ORDER — LIDOCAINE 2% (20 MG/ML) 5 ML SYRINGE
INTRAMUSCULAR | Status: DC | PRN
  Administered 2016-12-31: 40 mg via INTRAVENOUS
  Administered 2016-12-31: 60 mg via INTRAVENOUS

## 2016-12-31 MED ORDER — OXYCODONE HCL 5 MG PO TABS: 5.0000 mg | ORAL_TABLET | Freq: Once | ORAL | Status: DC | PRN

## 2016-12-31 MED ORDER — OXYCODONE HCL 5 MG/5ML PO SOLN
5.0000 mg | Freq: Once | ORAL | Status: DC | PRN
Start: 2016-12-31 — End: 2016-12-31

## 2016-12-31 MED ORDER — PROPOFOL 10 MG/ML IV BOLUS
INTRAVENOUS | Status: AC
  Filled 2016-12-31: qty 40

## 2016-12-31 MED ORDER — PHENYLEPHRINE HCL 10 MG/ML IJ SOLN
INTRAVENOUS | Status: DC | PRN
  Administered 2016-12-31: 25 ug/min via INTRAVENOUS

## 2016-12-31 MED ORDER — PROPOFOL 10 MG/ML IV BOLUS
INTRAVENOUS | Status: DC | PRN
  Administered 2016-12-31: 100 mg via INTRAVENOUS
  Administered 2016-12-31: 20 mg via INTRAVENOUS

## 2016-12-31 MED ORDER — LIDOCAINE 2% (20 MG/ML) 5 ML SYRINGE
INTRAMUSCULAR | Status: AC
  Filled 2016-12-31: qty 5

## 2016-12-31 MED ORDER — FENTANYL CITRATE (PF) 250 MCG/5ML IJ SOLN
INTRAMUSCULAR | Status: AC
  Filled 2016-12-31: qty 5

## 2016-12-31 MED ORDER — DEXAMETHASONE SODIUM PHOSPHATE 10 MG/ML IJ SOLN
INTRAMUSCULAR | Status: AC
  Filled 2016-12-31: qty 1

## 2016-12-31 MED ORDER — 0.9 % SODIUM CHLORIDE (POUR BTL) OPTIME
TOPICAL | Status: DC | PRN
  Administered 2016-12-31: 1000 mL

## 2016-12-31 MED ORDER — SUGAMMADEX SODIUM 500 MG/5ML IV SOLN
INTRAVENOUS | Status: AC
  Filled 2016-12-31: qty 5

## 2016-12-31 MED ORDER — DEXAMETHASONE SODIUM PHOSPHATE 4 MG/ML IJ SOLN
INTRAMUSCULAR | Status: DC | PRN
  Administered 2016-12-31: 10 mg via INTRAVENOUS

## 2016-12-31 MED ORDER — LACTATED RINGERS IV SOLN
INTRAVENOUS | Status: DC | PRN
  Administered 2016-12-31 (×2): via INTRAVENOUS

## 2016-12-31 MED ORDER — FENTANYL CITRATE (PF) 100 MCG/2ML IJ SOLN
INTRAMUSCULAR | Status: DC | PRN
  Administered 2016-12-31: 50 ug via INTRAVENOUS
  Administered 2016-12-31: 25 ug via INTRAVENOUS
  Administered 2016-12-31: 50 ug via INTRAVENOUS

## 2016-12-31 SURGICAL SUPPLY — 38 items
ADAPTER BRONCH F/PENTAX (ADAPTER) ×2 IMPLANT
BRUSH CYTOL CELLEBRITY 1.5X140 (MISCELLANEOUS) ×2 IMPLANT
BRUSH SUPERTRAX BIOPSY (INSTRUMENTS) IMPLANT
BRUSH SUPERTRAX NDL-TIP CYTO (INSTRUMENTS) ×4 IMPLANT
CANISTER SUCT 3000ML PPV (MISCELLANEOUS) ×2 IMPLANT
CHANNEL WORK EXTEND EDGE 180 (KITS) IMPLANT
CHANNEL WORK EXTEND EDGE 45 (KITS) IMPLANT
CHANNEL WORK EXTEND EDGE 90 (KITS) IMPLANT
CONT SPEC 4OZ CLIKSEAL STRL BL (MISCELLANEOUS) ×2 IMPLANT
COVER BACK TABLE 60X90IN (DRAPES) ×2 IMPLANT
FILTER STRAW FLUID ASPIR (MISCELLANEOUS) IMPLANT
FORCEPS BIOP SUPERTRX PREMAR (INSTRUMENTS) ×2 IMPLANT
GAUZE SPONGE 4X4 12PLY STRL (GAUZE/BANDAGES/DRESSINGS) ×2 IMPLANT
GLOVE BIO SURGEON STRL SZ7.5 (GLOVE) ×4 IMPLANT
GOWN STRL REUS W/ TWL LRG LVL3 (GOWN DISPOSABLE) ×2 IMPLANT
GOWN STRL REUS W/TWL LRG LVL3 (GOWN DISPOSABLE) ×2
KIT CLEAN ENDO COMPLIANCE (KITS) ×2 IMPLANT
KIT LOCATABLE GUIDE (CANNULA) IMPLANT
KIT MARKER FIDUCIAL DELIVERY (KITS) IMPLANT
KIT PROCEDURE EDGE 180 (KITS) ×2 IMPLANT
KIT PROCEDURE EDGE 45 (KITS) IMPLANT
KIT PROCEDURE EDGE 90 (KITS) IMPLANT
KIT ROOM TURNOVER OR (KITS) ×2 IMPLANT
MARKER SKIN DUAL TIP RULER LAB (MISCELLANEOUS) ×2 IMPLANT
NEEDLE SUPERTRX PREMARK BIOPSY (NEEDLE) ×4 IMPLANT
NS IRRIG 1000ML POUR BTL (IV SOLUTION) ×2 IMPLANT
OIL SILICONE PENTAX (PARTS (SERVICE/REPAIRS)) ×2 IMPLANT
PAD ARMBOARD 7.5X6 YLW CONV (MISCELLANEOUS) ×4 IMPLANT
PATCHES PATIENT (LABEL) ×6 IMPLANT
SYR 20CC LL (SYRINGE) ×2 IMPLANT
SYR 20ML ECCENTRIC (SYRINGE) ×2 IMPLANT
SYR 50ML SLIP (SYRINGE) ×2 IMPLANT
SYRINGE 20CC LL (MISCELLANEOUS) ×2 IMPLANT
TOWEL OR 17X24 6PK STRL BLUE (TOWEL DISPOSABLE) ×2 IMPLANT
TRAP SPECIMEN MUCOUS 40CC (MISCELLANEOUS) IMPLANT
TUBE CONNECTING 20X1/4 (TUBING) ×2 IMPLANT
UNDERPAD 30X30 (UNDERPADS AND DIAPERS) ×2 IMPLANT
WATER STERILE IRR 1000ML POUR (IV SOLUTION) ×2 IMPLANT

## 2016-12-31 NOTE — Interval H&P Note (Signed)
PCCM Interval Note  Pt presents today for further eval of L sided nodular infiltrates.   She reports exertional SOB, has had cough. On at least one occasion since I saw her she has seen hemoptysis - none since.  Denies pain, wheeze.   Vitals:   12/31/16 0705  BP: 129/70  Pulse: (!) 110  Resp: 20  Temp: (!) 97.5 F (36.4 C)  SpO2: 100%  Gen: Pleasant, obese woman, in no distress,  normal affect  ENT: No lesions,  mouth clear,  oropharynx clear, no postnasal drip  Neck: No JVD, no stridor  Lungs: No use of accessory muscles, distant, no wheeze  Cardiovascular: RRR, heart sounds normal, no murmur or gallops, trace peripheral edema  Abdomen: soft and NT, no HSM,  BS normal  Musculoskeletal: No deformities, no cyanosis or clubbing  Neuro: alert, non focal  Skin: Warm, no lesions or rashes   Imp / Plans:   Plan for FOB and bx's L lung masses for path and cytology.  Risks and benefits discussed with the patient and she agrees to proceed.   Baltazar Apo, MD, PhD 12/31/2016, 8:18 AM [facility] [phone] or if no answer [phone]

## 2016-12-31 NOTE — Op Note (Signed)
Video Bronchoscopy with Electromagnetic Navigation Procedure Note  Date of Operation: 12/31/2016  Pre-op Diagnosis: Left upper lobe mass and nodule  Post-op Diagnosis: Same  Surgeon: Baltazar Apo  Assistants: None  Anesthesia: General endotracheal anesthesia  Operation: Flexible video fiberoptic bronchoscopy with electromagnetic navigation and biopsies.  Estimated Blood Loss: Minimal  Complications: None apparent  Indications and History: Rose Lutz is a 66 y.o. female with history of tobacco use.  She was found to have left upper lobe mass by chest x-ray and CT scan of the chest.  PET scan was positive for hypermetabolism.  Recommended she was made to achieve tissue diagnosis via navigational bronchoscopy.  The risks, benefits, complications, treatment options and expected outcomes were discussed with the patient.  The possibilities of pneumothorax, pneumonia, reaction to medication, pulmonary aspiration, perforation of a viscus, bleeding, failure to diagnose a condition and creating a complication requiring transfusion or operation were discussed with the patient who freely signed the consent.    Description of Procedure: The patient was seen in the Preoperative Area, was examined and was deemed appropriate to proceed.  The patient was taken to OR 10, identified as Rose Lutz and the procedure verified as Flexible Video Fiberoptic Bronchoscopy.  A Time Out was held and the above information confirmed.   Prior to the date of the procedure a high-resolution CT scan of the chest was performed. Utilizing [place] a virtual tracheobronchial tree was generated to allow the creation of distinct navigation pathways to the patient's parenchymal abnormalities. After being taken to the operating room general anesthesia was initiated and the patient  was orally intubated. The video fiberoptic bronchoscope was introduced via the endotracheal tube and a general inspection  was performed which showed narrowing of the lateral left upper lobe airways.  The scope was able to pass.  There was no overt endobronchial lesion seen.. The extendable working channel and locator guide were introduced into the bronchoscope. The distinct navigation pathways prepared prior to this procedure were then utilized to navigate to within 0.6-1.7 cm of patient's lesion identified on CT scan. The extendable working channel was secured into place and the locator guide was withdrawn. Under fluoroscopic guidance transbronchial needle brushings, transbronchial Wang needle biopsies, and transbronchial forceps biopsies were performed at left upper lobe target 1 and left upper lobe target 2 to be sent for cytology and pathology. A bronchioalveolar lavage was performed in the left upper lobe and sent for cytology and microbiology (bacterial, fungal, AFB smears and cultures). At the end of the procedure a general airway inspection was performed and there was no evidence of active bleeding. The bronchoscope was removed.  The patient tolerated the procedure well. There was no significant blood loss and there were no obvious complications. A post-procedural chest x-ray is pending.  Samples: 1. Transbronchial needle brushings from left upper lobe target 1 and target 2 2. Transbronchial Wang needle biopsies from left upper lobe target 1 and target 2 3. Transbronchial forceps biopsies from left upper lobe target 1 and target 2 4. Bronchoalveolar lavage from left upper lobe  Plans:  The patient will be discharged from the PACU to home when recovered from anesthesia and after chest x-ray is reviewed. We will review the cytology, pathology and microbiology results with the patient when they become available. Outpatient followup will be with Dr Lamonte Sakai.    Baltazar Apo, MD, PhD 12/31/2016, 10:23 AM [facility] [phone] or if no answer [phone]

## 2016-12-31 NOTE — Anesthesia Procedure Notes (Signed)
Procedure Name: Intubation Date/Time: 12/31/2016 8:38 AM Performed by: Lieutenant Diego, CRNA Pre-anesthesia Checklist: Patient identified, Emergency Drugs available, Suction available and Patient being monitored Patient Re-evaluated:Patient Re-evaluated prior to induction Oxygen Delivery Method: Circle system utilized Preoxygenation: Pre-oxygenation with 100% oxygen Induction Type: IV induction Ventilation: Mask ventilation without difficulty Laryngoscope Size: Miller and 2 Grade View: Grade I Tube type: Oral Tube size: 8.5 mm Number of attempts: 1 Airway Equipment and Method: Stylet and Oral airway Placement Confirmation: ETT inserted through vocal cords under direct vision,  positive ETCO2 and breath sounds checked- equal and bilateral Secured at: 23 cm Tube secured with: Tape Dental Injury: Teeth and Oropharynx as per pre-operative assessment

## 2016-12-31 NOTE — Anesthesia Preprocedure Evaluation (Addendum)
Anesthesia Evaluation  Patient identified by MRN, date of birth, ID band Patient awake    Reviewed: Allergy & Precautions, NPO status , Unable to perform ROS - Chart review only  Airway Mallampati: II  TM Distance: >3 FB Neck ROM: Full    Dental  (+) Edentulous Upper, Edentulous Lower   Pulmonary shortness of breath, former smoker,  pulm mass   breath sounds clear to auscultation       Cardiovascular hypertension,  Rhythm:Regular Rate:Normal     Neuro/Psych    GI/Hepatic Neg liver ROS, GERD  ,  Endo/Other  negative endocrine ROS  Renal/GU negative Renal ROS     Musculoskeletal  (+) Arthritis ,   Abdominal   Peds  Hematology   Anesthesia Other Findings   Reproductive/Obstetrics                            Anesthesia Physical Anesthesia Plan  ASA: II  Anesthesia Plan: General   Post-op Pain Management:    Induction: Intravenous  PONV Risk Score and Plan: 4 or greater and Treatment may vary due to age or medical condition and Dexamethasone  Airway Management Planned: Oral ETT  Additional Equipment:   Intra-op Plan:   Post-operative Plan:   Informed Consent: I have reviewed the patients History and Physical, chart, labs and discussed the procedure including the risks, benefits and alternatives for the proposed anesthesia with the patient or authorized representative who has indicated his/her understanding and acceptance.   Dental advisory given  Plan Discussed with:   Anesthesia Plan Comments:         Anesthesia Quick Evaluation

## 2016-12-31 NOTE — Discharge Instructions (Signed)
Flexible Bronchoscopy, Care After These instructions give you information on caring for yourself after your procedure. Your doctor may also give you more specific instructions. Call your doctor if you have any problems or questions after your procedure. Follow these instructions at home:  Do not eat or drink anything for 2 hours after your procedure. If you try to eat or drink before the medicine wears off, food or drink could go into your lungs. You could also burn yourself.  After 2 hours have passed and when you can cough and gag normally, you may eat soft food and drink liquids slowly.  The day after the test, you may eat your normal diet.  You may do your normal activities.  Keep all doctor visits. Get help right away if:  You get more and more short of breath.  You get light-headed.  You feel like you are going to pass out (faint).  You have chest pain.  You have new problems that worry you.  You cough up more than a little blood.  You cough up more blood than before.  Please call our office for any questions or concerns.  [phone].   This information is not intended to replace advice given to you by your health care provider. Make sure you discuss any questions you have with your health care provider. Document Released: 11/03/2008 Document Revised: 06/14/2015 Document Reviewed: 09/10/2012 Elsevier Interactive Patient Education  2017 [employer].

## 2016-12-31 NOTE — Transfer of Care (Signed)
Immediate Anesthesia Transfer of Care Note  Patient: Rose Lutz  Procedure(s) Performed: VIDEO BRONCHOSCOPY WITH ENDOBRONCHIAL NAVIGATION, left upper lung lobe (Left Chest)  Patient Location: PACU  Anesthesia Type:General  Level of Consciousness: awake  Airway & Oxygen Therapy: Patient Spontanous Breathing and Patient connected to face mask oxygen  Post-op Assessment: Report given to RN and Post -op Vital signs reviewed and stable  Post vital signs: Reviewed and stable  Last Vitals:  Vitals:   12/31/16 0705  BP: 129/70  Pulse: (!) 110  Resp: 20  Temp: (!) 36.4 C  SpO2: 100%    Last Pain:  Vitals:   12/31/16 0705  TempSrc: Oral         Complications: No apparent anesthesia complications

## 2016-12-31 NOTE — Anesthesia Postprocedure Evaluation (Signed)
Anesthesia Post Note  Patient: Mardee Postin  Procedure(s) Performed: VIDEO BRONCHOSCOPY WITH ENDOBRONCHIAL NAVIGATION, left upper lung lobe (Left Chest)     Patient location during evaluation: PACU Anesthesia Type: General Level of consciousness: awake and alert Pain management: pain level controlled Vital Signs Assessment: post-procedure vital signs reviewed and stable Respiratory status: spontaneous breathing, nonlabored ventilation, respiratory function stable and patient connected to nasal cannula oxygen Cardiovascular status: blood pressure returned to baseline and stable Postop Assessment: no apparent nausea or vomiting Anesthetic complications: no    Last Vitals:  Vitals:   12/31/16 1200 12/31/16 1224  BP: 116/66 116/67  Pulse: 100 (!) 107  Resp: 20 18  Temp: (!) 36.3 C (!) 36.3 C  SpO2: 91% 91%    Last Pain:  Vitals:   12/31/16 1155  TempSrc:   PainSc: 0-No pain                 Jovanie Verge,JAMES TERRILL

## 2017-01-01 ENCOUNTER — Encounter (HOSPITAL_COMMUNITY): Payer: Self-pay | Admitting: Emergency Medicine

## 2017-01-01 LAB — ACID FAST SMEAR (AFB): ACID FAST SMEAR - AFSCU2: NEGATIVE

## 2017-01-02 ENCOUNTER — Telehealth: Payer: Self-pay | Admitting: Emergency Medicine

## 2017-01-02 DIAGNOSIS — C3492 Malignant neoplasm of unspecified part of left bronchus or lung: Secondary | ICD-10-CM

## 2017-01-02 LAB — CULTURE, RESPIRATORY: CULTURE: NO GROWTH

## 2017-01-02 LAB — CULTURE, RESPIRATORY W GRAM STAIN: Gram Stain: NONE SEEN

## 2017-01-02 NOTE — Telephone Encounter (Signed)
Reviewed the biopsy results with her today.  Shows non-small cell lung cancer.  I will refer her to the [facility] thoracic oncology clinic to discuss options for treatment.  FYI sent to Dr Chase Caller

## 2017-01-05 ENCOUNTER — Telehealth: Payer: Self-pay | Admitting: *Deleted

## 2017-01-05 NOTE — Telephone Encounter (Signed)
Oncology Nurse Navigator Documentation  Oncology Nurse Navigator Flowsheets 01/05/2017  Navigator Location [facility]  Referral date to RadOnc/MedOnc 01/02/2017  Navigator Encounter Type Telephone/I received referral on Ms. Vickroy. I called to schedule and her husband answered the phone.  Patient is sleeping and he asked me to call her back later today. I said I will call back later.   Telephone Outgoing Call  Treatment Phase Pre-Tx/Tx Discussion  Barriers/Navigation Needs Coordination of Care  Interventions Coordination of Care  Coordination of Care Appts  Acuity Level 1  Time Spent with Patient 15

## 2017-01-06 ENCOUNTER — Telehealth: Payer: Self-pay | Admitting: *Deleted

## 2017-01-06 ENCOUNTER — Other Ambulatory Visit: Payer: Self-pay | Admitting: Rheumatology

## 2017-01-06 DIAGNOSIS — R918 Other nonspecific abnormal finding of lung field: Secondary | ICD-10-CM

## 2017-01-06 NOTE — Telephone Encounter (Signed)
Oncology Nurse Navigator Documentation  Oncology Nurse Navigator Flowsheets 01/06/2017  Navigator Location [facility]  Navigator Encounter Type Telephone/I received a call from Rose Lutz. I called and spoke with her. I scheduled her to be seen with Dr. Julien Nordmann. I offered her several appts in Dec and Jan but she did not want to be seen until jan 14. I scheduled her to be seen then. She verbalized understanding of appt time and place.   Telephone Outgoing Call  Treatment Phase Pre-Tx/Tx Discussion  Barriers/Navigation Needs Coordination of Care  Interventions Coordination of Care  Coordination of Care Appts  Acuity Level 1  Time Spent with Patient 15

## 2017-01-06 NOTE — Telephone Encounter (Signed)
Oncology Nurse Navigator Documentation  Oncology Nurse Navigator Flowsheets 01/06/2017  Navigator Location [facility]  Navigator Encounter Type Telephone/I received referral on [name]. I called and spoke with her husband. He states he thinks she wants to be seen with the [facility] in [place].  I asked if patient could call me to updated me. He took my name and phone number.   Telephone Outgoing Call  Treatment Phase Pre-Tx/Tx Discussion  Barriers/Navigation Needs Coordination of Care  Interventions Coordination of Care  Coordination of Care Appts  Acuity Level 1  Time Spent with Patient 15

## 2017-01-07 NOTE — Telephone Encounter (Signed)
Last Visit: 10/10/16 Next Visit: 01/23/17 Labs: 12/25/16 stable  Okay to refill per Dr. Estanislado Pandy

## 2017-01-21 ENCOUNTER — Telehealth: Payer: Self-pay | Admitting: Internal Medicine

## 2017-01-21 LAB — CULTURE, FUNGUS WITHOUT SMEAR

## 2017-01-21 NOTE — Telephone Encounter (Signed)
Spoke with patients husband and confirmed arrival time for 01/22/17 [place]

## 2017-01-22 ENCOUNTER — Inpatient Hospital Stay: Payer: Medicare Other | Attending: Internal Medicine | Admitting: Internal Medicine

## 2017-01-22 ENCOUNTER — Other Ambulatory Visit (HOSPITAL_BASED_OUTPATIENT_CLINIC_OR_DEPARTMENT_OTHER): Payer: Medicare Other

## 2017-01-22 ENCOUNTER — Encounter: Payer: Self-pay | Admitting: Hematology

## 2017-01-22 ENCOUNTER — Telehealth: Payer: Self-pay | Admitting: Genetics

## 2017-01-22 ENCOUNTER — Encounter: Payer: Self-pay | Admitting: Internal Medicine

## 2017-01-22 ENCOUNTER — Ambulatory Visit
Admission: RE | Admit: 2017-01-22 | Discharge: 2017-01-22 | Disposition: A | Discharge status: Home / Self Care | Payer: Medicare HMO | Source: Ambulatory Visit | Attending: Radiation Oncology | Admitting: Radiation Oncology

## 2017-01-22 ENCOUNTER — Encounter: Payer: Self-pay | Admitting: General Practice

## 2017-01-22 ENCOUNTER — Encounter: Payer: Self-pay | Admitting: *Deleted

## 2017-01-22 VITALS — BP 79/45 | HR 100 | Temp 97.0°F | Resp 17 | Ht 64.0 in | Wt 175.6 lb

## 2017-01-22 VITALS — BP 100/72 | HR 100 | Temp 98.7°F | Resp 17

## 2017-01-22 DIAGNOSIS — IMO0002 Reserved for concepts with insufficient information to code with codable children: Secondary | ICD-10-CM

## 2017-01-22 DIAGNOSIS — R5383 Other fatigue: Secondary | ICD-10-CM | POA: Diagnosis not present

## 2017-01-22 DIAGNOSIS — C3412 Malignant neoplasm of upper lobe, left bronchus or lung: Secondary | ICD-10-CM

## 2017-01-22 DIAGNOSIS — R079 Chest pain, unspecified: Secondary | ICD-10-CM

## 2017-01-22 DIAGNOSIS — Z808 Family history of malignant neoplasm of other organs or systems: Secondary | ICD-10-CM | POA: Diagnosis not present

## 2017-01-22 DIAGNOSIS — R0602 Shortness of breath: Secondary | ICD-10-CM | POA: Diagnosis not present

## 2017-01-22 DIAGNOSIS — Z77090 Contact with and (suspected) exposure to asbestos: Secondary | ICD-10-CM | POA: Diagnosis not present

## 2017-01-22 DIAGNOSIS — I10 Essential (primary) hypertension: Secondary | ICD-10-CM | POA: Diagnosis not present

## 2017-01-22 DIAGNOSIS — E0789 Other specified disorders of thyroid: Secondary | ICD-10-CM | POA: Diagnosis not present

## 2017-01-22 DIAGNOSIS — Z803 Family history of malignant neoplasm of breast: Secondary | ICD-10-CM

## 2017-01-22 DIAGNOSIS — Z87891 Personal history of nicotine dependence: Secondary | ICD-10-CM

## 2017-01-22 DIAGNOSIS — Z800 Family history of malignant neoplasm of digestive organs: Secondary | ICD-10-CM

## 2017-01-22 DIAGNOSIS — Z789 Other specified health status: Secondary | ICD-10-CM

## 2017-01-22 DIAGNOSIS — E041 Nontoxic single thyroid nodule: Secondary | ICD-10-CM

## 2017-01-22 DIAGNOSIS — R918 Other nonspecific abnormal finding of lung field: Secondary | ICD-10-CM

## 2017-01-22 DIAGNOSIS — M069 Rheumatoid arthritis, unspecified: Secondary | ICD-10-CM | POA: Diagnosis not present

## 2017-01-22 DIAGNOSIS — Z809 Family history of malignant neoplasm, unspecified: Secondary | ICD-10-CM

## 2017-01-22 DIAGNOSIS — C3490 Malignant neoplasm of unspecified part of unspecified bronchus or lung: Secondary | ICD-10-CM

## 2017-01-22 LAB — CBC WITH DIFFERENTIAL/PLATELET
BASO%: 0.1 % (ref 0.0–2.0)
BASOS ABS: 0 10*3/uL (ref 0.0–0.1)
EOS%: 0.2 % (ref 0.0–7.0)
Eosinophils Absolute: 0 10*3/uL (ref 0.0–0.5)
HCT: 33.6 % — ABNORMAL LOW (ref 34.8–46.6)
HGB: 10.4 g/dL — ABNORMAL LOW (ref 11.6–15.9)
LYMPH%: 15 % (ref 14.0–49.7)
MCH: 28 pg (ref 25.1–34.0)
MCHC: 31 g/dL — AB (ref 31.5–36.0)
MCV: 90.3 fL (ref 79.5–101.0)
MONO#: 0.6 10*3/uL (ref 0.1–0.9)
MONO%: 6.1 % (ref 0.0–14.0)
NEUT#: 8.1 10*3/uL — ABNORMAL HIGH (ref 1.5–6.5)
NEUT%: 78.6 % — AB (ref 38.4–76.8)
PLATELETS: 328 10*3/uL (ref 145–400)
RBC: 3.72 10*6/uL (ref 3.70–5.45)
RDW: 17 % — ABNORMAL HIGH (ref 11.2–14.5)
WBC: 10.3 10*3/uL (ref 3.9–10.3)
lymph#: 1.5 10*3/uL (ref 0.9–3.3)

## 2017-01-22 LAB — COMPREHENSIVE METABOLIC PANEL
ALT: 11 U/L (ref 0–55)
ANION GAP: 9 meq/L (ref 3–11)
AST: 16 U/L (ref 5–34)
Albumin: 2.7 g/dL — ABNORMAL LOW (ref 3.5–5.0)
Alkaline Phosphatase: 144 U/L (ref 40–150)
BUN: 7.3 mg/dL (ref 7.0–26.0)
CALCIUM: 9.3 mg/dL (ref 8.4–10.4)
CHLORIDE: 103 meq/L (ref 98–109)
CO2: 27 meq/L (ref 22–29)
CREATININE: 0.9 mg/dL (ref 0.6–1.1)
Glucose: 109 mg/dl (ref 70–140)
POTASSIUM: 4.3 meq/L (ref 3.5–5.1)
Sodium: 139 mEq/L (ref 136–145)
Total Bilirubin: 0.27 mg/dL (ref 0.20–1.20)
Total Protein: 6.7 g/dL (ref 6.4–8.3)

## 2017-01-22 NOTE — Addendum Note (Signed)
Encounter addended by: Hayden Pedro, PA-C on: 01/22/2017 2:52 PM  Actions taken: Problem List modified, Visit diagnoses modified, Order list changed, Diagnosis association updated, Sign clinical note, Follow-up modified

## 2017-01-22 NOTE — Progress Notes (Signed)
Oncology Nurse Navigator Documentation  Oncology Nurse Navigator Flowsheets 01/22/2017  Navigator Location [facility]  Navigator Encounter Type Clinic/MDC/I spoke with patient and husband today.  I gave and explained information on lung cancer, treatment, resources, and next steps.  Scheduled MRI Brain for 01/27/17 and PFT's on 01/29/17. I explained mri brian appt time and location and provided a map.  I also explained PFT's appt time, location, map, and pre-procedure instructions. Patient verbalized understanding of appts.   Abnormal Finding Date 10/29/2016  Confirmed Diagnosis Date 01/07/2017  Multidisiplinary Clinic Date 01/22/2017  Patient Visit Type MedOnc  Treatment Phase Pre-Tx/Tx Discussion  Barriers/Navigation Needs Education;Coordination of Care  Education Newly Diagnosed Cancer Education  Interventions Education;Coordination of Care  Coordination of Care Appts;Other  Education Method Verbal;Written;Other  Support Groups/Services Other  Acuity Level 3  Acuity Level 3 Coordination of multimodality treatment;Emotional needs  Time Spent with Patient 60

## 2017-01-22 NOTE — Progress Notes (Signed)
[place] [phone]   [phone] Multidisciplinary Thoracic Oncology Clinic  CONSULT NOTE  REFERRING PHYSICIAN: Dr. Brand Males.  REASON FOR CONSULTATION:  67 years old white female recently diagnosed with lung cancer.  HPI Rose Lutz is a 67 y.o. female with past medical history significant for hypertension, rheumatoid arthritis, GERD, asbestos exposure, osteoarthritis as well as long history for smoking but quit 10 years ago.  The patient mentioned that she moved to [place] from [place] 5 years ago.  She used to work in a [employer] at that time and she had asbestos exposure.  She was found during 1 of the visit and claim for the asbestos exposure to have a suspicious nodule in her lung.  This was routinely followed with X ray imaging for the last 10 years with no significant change.  She was seen recently by her rheumatologist Dr. Estanislado Pandy and had repeat chest x-ray performed on 10/14/2016 and it showed hazy opacity in the left upper lobe. This was followed by CT scan of the chest on October 29, 2016 and it showed multiple nodular and masslike areas corresponding to the abnormality on the recent chest x-ray.  The largest of these is in the posterior aspect of the left upper lobe measuring 3.2 x 4.3 x 4.7 cm with macro lobulated margins causing the formation of the adjacent major fissure which extends posteriorly towards the superior segment of the left lower lobe without definite invasion directly into the superior segment of the left lower lobe.  Several other smaller lesions were noted including 2.6 x 4.3 x 3.1 cm masslike area in the posterior aspect of the left upper lobe.  A PET scan was performed on November 21, 2016 and that showed hypermetabolic bilobed masslike opacity in the posterior left upper lobe and lingula which abuts the left hilum and suspicious for malignancy.  There was no other local or distant metastatic disease  identified.  There was 0.4 indeterminate right middle lobe pulmonary nodule which is too small to characterize by PET.  There was also hypermetabolic right thyroid lobe nodule and thyroid carcinoma cannot be excluded.  The patient had ultrasound of the thyroid performed on December 19, 2016 and it showed nodule #1 in the right thyroid lobe does not meet criteria for biopsy.  Nodule to meet criteria for 1 year follow-up. On December 31, 2016 the patient underwent flexible video fiberoptic bronchoscopy with electromagnetic navigation and biopsies under the care of Dr. Lamonte Sakai.  The fine-needle aspiration of the left upper lobe mass ([id number]) showed rare atypical cells suspicious for non-small cell carcinoma.  The transbronchial needle aspiration of the left upper lobe mass ([id number]) was consistent with non-small cell carcinoma but there was insufficient tumor cellularity in the cell block for additional studies. The patient was referred to the multidisciplinary thoracic oncology clinic today for further evaluation and recommendation regarding treatment of her condition. When seen today she continues to complain of fatigue as well as shortness of breath and mild chest pain.  She intentionally lost around 25 pounds in the last 6 months.  She denied having any cough or hemoptysis.  She denied having any headache or visual changes.  She has no nausea, vomiting, diarrhea or constipation. Family history significant for mother with breast cancer and died at age 51, maternal grandmother had colon cancer, son had pancreatic cancer and died at age 62. The patient is married and had 3 children, only 1 living, a daughter in [place].  She is to work in a [employer].  She has a history of smoking more than 1 pack/day for around 35 years and quit 10 years ago.  She has no history of alcohol or drug abuse.  HPI  Past Medical History:  Diagnosis Date  . Asbestos exposure    Spot on lung  . Chronic pain    On  hydrocodone  . Depression   . Dyspnea    on exertion  . Essential hypertension   . GERD (gastroesophageal reflux disease)   . Pulmonary asbestosis [facility])    Patient states mild  . Rheumatoid arthritis ([place])    and osteoarthritis    Past Surgical History:  Procedure Laterality Date  . ABDOMINAL HYSTERECTOMY    . BILATERAL OOPHORECTOMY    . CESAREAN SECTION     x3  . KNEE ARTHROPLASTY Right   . TONSILLECTOMY    . VIDEO BRONCHOSCOPY WITH ENDOBRONCHIAL NAVIGATION Left 12/31/2016   Procedure: VIDEO BRONCHOSCOPY WITH ENDOBRONCHIAL NAVIGATION, left upper lung lobe;  Surgeon: Collene Gobble, MD;  Location: [facility];  Service: Thoracic;  Laterality: Left;  . YAG LASER APPLICATION Right 84/69/6295   Procedure: YAG LASER APPLICATION;  Surgeon: Rutherford Guys, MD;  Location: [facility];  Service: Ophthalmology;  Laterality: Right;  . YAG LASER APPLICATION Left 28/41/3244   Procedure: YAG LASER APPLICATION;  Surgeon: Rutherford Guys, MD;  Location: [facility];  Service: Ophthalmology;  Laterality: Left;    Family History  Problem Relation Age of Onset  . Breast cancer Mother   . Colon cancer Maternal Grandmother   . Pancreatic cancer Son     Social History Social History   Tobacco Use  . Smoking status: Former Smoker    Packs/day: 1.25    Years: 38.00    Pack years: 47.50    Types: Cigarettes    Start date: 01/21/1968    Last attempt to quit: 01/20/2006    Years since quitting: 11.0  . Smokeless tobacco: Never Used  Substance Use Topics  . Alcohol use: No    Alcohol/week: 0.0 oz  . Drug use: No    Allergies  Allergen Reactions  . Quinine Derivatives Nausea And Vomiting  . Sulfa Antibiotics Nausea And Vomiting  . Percocet [Oxycodone-Acetaminophen] Other (See Comments)    Headahces    Current Outpatient Medications  Medication Sig Dispense Refill  . amitriptyline (ELAVIL) 100 MG tablet Take 100 mg by mouth at bedtime.    . cetirizine (ZYRTEC) 10 MG tablet Take 10 mg by mouth at bedtime.     . diphenhydrAMINE (BENADRYL) 25 mg capsule Take 50-100 mg by mouth every 6 (six) hours as needed for itching or allergies.    . folic acid (FOLVITE) 1 MG tablet Take 1 tablet (1 mg total) by mouth daily. 90 tablet 4  . HYDROcodone-acetaminophen (NORCO) 10-325 MG tablet Take 1 tablet by mouth every 6 (six) hours as needed for moderate pain.     . hydroxychloroquine (PLAQUENIL) 200 MG tablet Take 2 tablets (400 mg total) by mouth every evening. Monday through Friday    . losartan-hydrochlorothiazide (HYZAAR) 50-12.5 MG tablet TAKE 1 TABLET EVERY DAY 90 tablet 3  . methotrexate (RHEUMATREX) 2.5 MG tablet TAKE 6 TABLETS ONE TIME WEEKLY ON TUESDAYS. CAUTION: CHEMOTHERAPY. PROTECT FROM LIGHT. 48 tablet 0  . montelukast (SINGULAIR) 10 MG tablet Take 10 mg by mouth at bedtime.     . Multiple Vitamin (MULTIVITAMIN WITH MINERALS) TABS tablet Take 1 tablet by mouth daily.    Marland Kitchen  omeprazole (PRILOSEC) 20 MG capsule Take 20 mg by mouth daily.    . vitamin C (ASCORBIC ACID) 250 MG tablet Take 250 mg by mouth daily.    . Vitamin D, Ergocalciferol, (DRISDOL) 50000 units CAPS capsule Take 1 capsule (50,000 Units total) by mouth every 7 (seven) days. 12 capsule 0   No current facility-administered medications for this visit.     Review of Systems  Constitutional: positive for fatigue and weight loss Eyes: negative Ears, nose, mouth, throat, and face: negative Respiratory: positive for cough, dyspnea on exertion and pleurisy/chest pain Cardiovascular: negative Gastrointestinal: negative Genitourinary:negative Integument/breast: negative Hematologic/lymphatic: negative Musculoskeletal:negative Neurological: negative Behavioral/Psych: negative Endocrine: negative Allergic/Immunologic: negative  Physical Exam  QQI:WLNLG, healthy, no distress, well nourished, well developed and anxious SKIN: skin color, texture, turgor are normal, no rashes or significant lesions HEAD: Normocephalic, No masses,  lesions, tenderness or abnormalities EYES: normal, PERRLA, Conjunctiva are pink and non-injected EARS: External ears normal, Canals clear OROPHARYNX:no exudate, no erythema and lips, buccal mucosa, and tongue normal  NECK: supple, no adenopathy, no JVD LYMPH:  no palpable lymphadenopathy, no hepatosplenomegaly BREAST:not examined LUNGS: clear to auscultation , and palpation HEART: regular rate & rhythm, no murmurs and no gallops ABDOMEN:abdomen soft, non-tender, normal bowel sounds and no masses or organomegaly BACK: Back symmetric, no curvature., No CVA tenderness EXTREMITIES:no joint deformities, effusion, or inflammation, no edema  NEURO: alert & oriented x 3 with fluent speech, no focal motor/sensory deficits  PERFORMANCE STATUS: ECOG 1  LABORATORY DATA: Lab Results  Component Value Date   WBC 10.3 01/22/2017   HGB 10.4 (L) 01/22/2017   HCT 33.6 (L) 01/22/2017   MCV 90.3 01/22/2017   PLT 328 01/22/2017      Chemistry      Component Value Date/Time   NA 136 12/26/2016 1000   NA 139 12/25/2016 1519   K 3.5 12/26/2016 1000   CL 102 12/26/2016 1000   CO2 25 12/26/2016 1000   BUN 10 12/26/2016 1000   BUN 9 12/25/2016 1519   CREATININE 0.95 12/26/2016 1000   CREATININE 0.90 12/05/2015 1109      Component Value Date/Time   CALCIUM 8.8 (L) 12/26/2016 1000   ALKPHOS 117 12/26/2016 1000   AST 17 12/26/2016 1000   ALT 11 (L) 12/26/2016 1000   BILITOT 0.4 12/26/2016 1000   BILITOT 0.3 12/25/2016 1519       RADIOGRAPHIC STUDIES: Dg Chest Port 1 View  Result Date: 12/31/2016 CLINICAL DATA:  Status post bronchoscopy with biopsy. EXAM: PORTABLE CHEST 1 VIEW COMPARISON:  PET scan of November 21, 2016. Radiographs of October 13, 2016. FINDINGS: Stable cardiomediastinal silhouette. No pneumothorax is noted. Right lung is clear. No significant pleural effusion is noted. Increased diffuse opacity is noted throughout the left lung which may represent inflammation or edema.  Degenerative changes are seen involving the left glenohumeral joint. IMPRESSION: No pneumothorax is seen status post bronchoscopy. Diffusely increased density is noted in the left lung which may represent inflammation or possibly edema. Electronically Signed   By: Marijo Conception, M.D.   On: 12/31/2016 10:49   Dg C-arm Bronchoscopy  Result Date: 12/31/2016 C-ARM BRONCHOSCOPY: Fluoroscopy was utilized by the requesting physician.  No radiographic interpretation.    ASSESSMENT: This is a very pleasant 67 years old white female with a stage IIIA (T3, N1, M0) non-small cell lung cancer presented with 2 masses in the left upper lobe as well as questionable left hilar adenopathy diagnosed in December 2018.   PLAN:  I had a lengthy discussion with the patient and her husband today about her current disease of stage, prognosis and treatment options. I personally and independently reviewed the scan images and discussed the results and showed the images to the patient and her husband today. I recommended for the patient to complete the staging workup by ordering MRI of the brain to rule out brain metastasis. The patient could be also a potential surgical candidate for resection.  We will check her pulmonary function test.  We will also arrange for the patient to see Dr. Servando Snare today for evaluation of surgical resection. If the patient is not a good surgical candidate for resection, we would consider her for a course of concurrent chemoradiation with weekly carboplatin for [facility] of 2 and paclitaxel 45 mg/M2 followed by consolidation immunotherapy if there is no evidence of disease progression. I discussed with the patient the adverse effect of the chemotherapy including but not limited to alopecia, myelosuppression, nausea and vomiting, peripheral neuropathy, liver or renal dysfunction. The patient will also see radiation oncology later today. She was seen at the multidisciplinary thoracic oncology clinic today  by medical oncology, thoracic surgery, radiation oncology as well as thoracic navigator and social worker. I will arrange for the patient to come back for follow-up visit in 2 weeks for evaluation and more discussion of her treatment options based on the final surgical evaluation. For the suspicious hypermetabolic right thyroid nodule, I will order fine-needle aspiration of the right thyroid nodule to rule out malignancy. The patient was advised to call immediately if she has any concerning symptoms in the interval. The patient voices understanding of current disease status and treatment options and is in agreement with the current care plan.  All questions were answered. The patient knows to call the clinic with any problems, questions or concerns. We can certainly see the patient much sooner if necessary.  Thank you so much for allowing me to participate in the care of [place]. I will continue to follow up the patient with you and assist in her care.  I spent 55 minutes counseling the patient face to face. The total time spent in the appointment was 80 minutes.  Disclaimer: This note was dictated with voice recognition software. Similar sounding words can inadvertently be transcribed and may not be corrected upon review.   Eilleen Kempf January 22, 2017, 1:14 PM

## 2017-01-22 NOTE — Addendum Note (Signed)
Encounter addended by: Hayden Pedro, PA-C on: 01/22/2017 2:53 PM  Actions taken: Sign clinical note

## 2017-01-22 NOTE — Progress Notes (Signed)
[facility] Psychosocial Distress Screening Clinical Social Work  Clinical Social Work was referred by distress screening protocol.  The patient scored a 7 on the Psychosocial Distress Thermometer which indicates moderate distress. Clinical Social Worker Edwyna Shell  to assess for distress and other psychosocial needs. CSW and patient discussed common feeling and emotions when being diagnosed with cancer, and the importance of support during treatment. CSW informed patient of the support team and support services at [facility]. CSW provided contact information and encouraged patient to call with any questions or concerns.  Per patient, her major concern is being able to visit extended family members in [place].  Had planned to visit over Christmas holiday but deferred visit due to health concerns.  Is ambivalent over cancer treatment plan - opposed to radiation and chemotherapy based on experience w son's treatment for pancreatic cancer and mothers treatment for breast cancer reoccurrence in 2006 - "it took all the quality of life away, I am not so much concerned about quantity of life, but I want quality of life."  When asked to define "quality", stated her desires to remain close to extended family who all live in [place] and/or [place].  Lives w husband in own home outside of [place].  Husband is able to drive to appointments; however, both expressed concern about affording cost of treatment including gas, co pays and similar.  Both patient and husband are retired and depend on [place] only.  No support from neighbors or community "all our support is in [place], that's where our family is, we have no one here."  CSW discussed options for financial help, provided applications for appropriate resources, advised patient to talk w [employer] to explore additional options for financial help.    ONCBCN DISTRESS SCREENING 01/22/2017  Distress experienced in past week  (1-10) 7  Practical problem type Transportation  Family Problem type Other (comment)  Emotional problem type Depression;Adjusting to illness  Physical Problem type Pain;Getting around;Breathing;Swollen arms/legs  Physician notified of physical symptoms Yes    Clinical Social Worker follow up needed: Yes.    If yes, follow up plan:  Pt asked to call CSW to make appt to complete applications for [employer] and [employer].  Does not have insurance coverage for [employer] for community case mangaement.

## 2017-01-22 NOTE — Telephone Encounter (Signed)
Left message on voicemail regarding upcoming appointment w/ D/T/Loc/Ph#

## 2017-01-22 NOTE — Progress Notes (Signed)
Radiation Oncology         [phone] ________________________________  Name: Rose Lutz        MRN: [id number]  Date of Service: 01/22/2017 DOB: [birth year]  WN:IOEVOJJ, Hulen Shouts, PA-C  Byrum, Rose Fillers, MD     REFERRING PHYSICIAN: Collene Gobble, MD   DIAGNOSIS: The encounter diagnosis was Primary malignant neoplasm of left upper lobe of lung ([place]).   HISTORY OF PRESENT ILLNESS: Rose Lutz is a 67 y.o. female seen at the request of Dr. Lamonte Sakai for a new diagnosis of NSCLC of the LUL. She has a history of asbestos exposure as well as latent TB that she was treated for years ago.  The patient noted a cough and was following up with her rheumatologist who ordered a CXR in September 2018 which revealed a possible opacity in the left upper lobe. She had a Ct Chest on 10/29/16 which revealed a 3.2 x 4.3 x 4.7 cm mass with several smaller lesions nearby the largest measuring 2.6 x 4.3 x 3.1 cm without adenopathy. She had a PET scan on 11/21/16 which revealed hypermetabolic change in the right lobe of the thyroid and hypermetabolism in the LUL with SUV of 17 in a 4.5 x 4 cm mass, as well as an SUV of 24.6 in the 4.3 x 2.9 cm mass. She underwent a navigational bronchoscopy on 12/31/16 and several of the FNA specimens revealed rare atypical cells consistent with NSCLC, favor adenocarcinoma. She comes today to discuss options of treatment for her cancer.    PREVIOUS RADIATION THERAPY: No   PAST MEDICAL HISTORY:  Past Medical History:  Diagnosis Date  . Asbestos exposure    Spot on lung  . Chronic pain    On hydrocodone  . Depression   . Dyspnea    on exertion  . Essential hypertension   . GERD (gastroesophageal reflux disease)   . Pulmonary asbestosis [facility])    Patient states mild  . Rheumatoid arthritis ([place])    and osteoarthritis       PAST SURGICAL HISTORY: Past Surgical History:  Procedure Laterality Date  . ABDOMINAL HYSTERECTOMY    . BILATERAL OOPHORECTOMY    .  CESAREAN SECTION     x3  . KNEE ARTHROPLASTY Right   . TONSILLECTOMY    . VIDEO BRONCHOSCOPY WITH ENDOBRONCHIAL NAVIGATION Left 12/31/2016   Procedure: VIDEO BRONCHOSCOPY WITH ENDOBRONCHIAL NAVIGATION, left upper lung lobe;  Surgeon: Collene Gobble, MD;  Location: [facility];  Service: Thoracic;  Laterality: Left;  . YAG LASER APPLICATION Right 00/93/8182   Procedure: YAG LASER APPLICATION;  Surgeon: Rutherford Guys, MD;  Location: [facility];  Service: Ophthalmology;  Laterality: Right;  . YAG LASER APPLICATION Left 99/37/1696   Procedure: YAG LASER APPLICATION;  Surgeon: Rutherford Guys, MD;  Location: [facility];  Service: Ophthalmology;  Laterality: Left;     FAMILY HISTORY:  Family History  Problem Relation Age of Onset  . Breast cancer Mother   . Colon cancer Maternal Grandmother   . Pancreatic cancer Son      SOCIAL HISTORY:  reports that she quit smoking about 11 years ago. Her smoking use included cigarettes. She started smoking about 49 years ago. She has a 47.50 pack-year smoking history. she has never used smokeless tobacco. She reports that she does not drink alcohol or use drugs. The patient is married and lives in [place]. She is retired from working in [employer]. She has family in [place] and [place].  ALLERGIES: Quinine derivatives; Sulfa antibiotics; and Percocet [oxycodone-acetaminophen]   MEDICATIONS:  Current Outpatient Medications  Medication Sig Dispense Refill  . amitriptyline (ELAVIL) 100 MG tablet Take 100 mg by mouth at bedtime.    . Ascorbic Acid (VITAMIN C) 250 MG CHEW Chew 1 tablet by mouth daily.    . cetirizine (ZYRTEC) 10 MG tablet Take 10 mg by mouth at bedtime.    . diphenhydrAMINE (BENADRYL) 25 mg capsule Take 50-100 mg by mouth every 6 (six) hours as needed for itching or allergies.    . folic acid (FOLVITE) 1 MG tablet Take 1 tablet (1 mg total) by mouth daily. 90 tablet 4  . HYDROcodone-acetaminophen (NORCO) 10-325 MG tablet Take 1  tablet by mouth every 6 (six) hours as needed for moderate pain.     . hydroxychloroquine (PLAQUENIL) 200 MG tablet Take 2 tablets (400 mg total) by mouth every evening. Monday through Friday    . losartan (COZAAR) 25 MG tablet Take 1 tablet by mouth daily.  2  . methotrexate (RHEUMATREX) 2.5 MG tablet TAKE 6 TABLETS ONE TIME WEEKLY ON TUESDAYS. CAUTION: CHEMOTHERAPY. PROTECT FROM LIGHT. 48 tablet 0  . montelukast (SINGULAIR) 10 MG tablet Take 10 mg by mouth at bedtime.     . Multiple Vitamin (MULTIVITAMIN WITH MINERALS) TABS tablet Take 1 tablet by mouth daily.    Marland Kitchen omeprazole (PRILOSEC) 20 MG capsule Take 20 mg by mouth daily.    . Vitamin D, Ergocalciferol, (DRISDOL) 50000 units CAPS capsule Take 1 capsule (50,000 Units total) by mouth every 7 (seven) days. 12 capsule 0   No current facility-administered medications for this encounter.      REVIEW OF SYSTEMS: On review of systems, the patient reports that she is doing well overall. She denies any chest pain. She notes shortness of breath with exertion, as well as a dry cough, fevers, chills, night sweats, unintended weight changes. She had hemoptysis for a few days after her biopsy. She denies any bowel or bladder disturbances, and denies abdominal pain, nausea or vomiting. She denies any new musculoskeletal or joint aches or pains. A complete review of systems is obtained and is otherwise negative.     PHYSICAL EXAM:  Wt Readings from Last 3 Encounters:  01/22/17 175 lb 9.6 oz (79.7 kg)  12/26/16 179 lb 3.2 oz (81.3 kg)  12/19/16 177 lb (80.3 kg)   Temp Readings from Last 3 Encounters:  01/22/17 (!) 97 F (36.1 C) (Oral)  01/22/17 98.7 F (37.1 C)  12/31/16 (!) 97.4 F (36.3 C)   BP Readings from Last 3 Encounters:  01/22/17 (!) 79/45  01/22/17 100/72  12/31/16 116/67   Pulse Readings from Last 3 Encounters:  01/22/17 100  01/22/17 100  12/31/16 (!) 107   In general this is a well appearing caucasian female in no acute  distress. She is alert and oriented x4 and appropriate throughout the examination. HEENT reveals that the patient is normocephalic, atraumatic. EOMs are intact. PERRLA. Skin is intact without any evidence of gross lesions. Cardiovascular exam reveals a regular rate and rhythm, no clicks rubs or murmurs are auscultated. Chest is clear to auscultation bilaterally, there are more coarse breath sounds in the Left upper lung field. Lymphatic assessment is performed and does not reveal any adenopathy in the cervical, supraclavicular, axillary, or inguinal chains. Abdomen has active bowel sounds in all quadrants and is intact. The abdomen is soft, non tender, non distended. Lower extremities are negative for pretibial pitting edema, deep calf  tenderness, cyanosis or clubbing.   ECOG = 1  0 - Asymptomatic (Fully active, able to carry on all predisease activities without restriction)  1 - Symptomatic but completely ambulatory (Restricted in physically strenuous activity but ambulatory and able to carry out work of a light or sedentary nature. For example, light housework, office work)  2 - Symptomatic, <50% in bed during the day (Ambulatory and capable of all self care but unable to carry out any work activities. Up and about more than 50% of waking hours)  3 - Symptomatic, >50% in bed, but not bedbound (Capable of only limited self-care, confined to bed or chair 50% or more of waking hours)  4 - Bedbound (Completely disabled. Cannot carry on any self-care. Totally confined to bed or chair)  5 - Death   Eustace Pen MM, Creech RH, Tormey DC, et al. [phone]). "Toxicity and response criteria of the [facility] Group". [place] Oncol. 5 (6): 649-55    LABORATORY DATA:  Lab Results  Component Value Date   WBC 10.3 01/22/2017   HGB 10.4 (L) 01/22/2017   HCT 33.6 (L) 01/22/2017   MCV 90.3 01/22/2017   PLT 328 01/22/2017   Lab Results  Component Value Date   NA 139 01/22/2017   K 4.3  01/22/2017   CL 102 12/26/2016   CO2 27 01/22/2017   Lab Results  Component Value Date   ALT 11 01/22/2017   AST 16 01/22/2017   ALKPHOS 144 01/22/2017   BILITOT 0.27 01/22/2017      RADIOGRAPHY: Dg Chest Port 1 View  Result Date: 12/31/2016 CLINICAL DATA:  Status post bronchoscopy with biopsy. EXAM: PORTABLE CHEST 1 VIEW COMPARISON:  PET scan of November 21, 2016. Radiographs of October 13, 2016. FINDINGS: Stable cardiomediastinal silhouette. No pneumothorax is noted. Right lung is clear. No significant pleural effusion is noted. Increased diffuse opacity is noted throughout the left lung which may represent inflammation or edema. Degenerative changes are seen involving the left glenohumeral joint. IMPRESSION: No pneumothorax is seen status post bronchoscopy. Diffusely increased density is noted in the left lung which may represent inflammation or possibly edema. Electronically Signed   By: Marijo Conception, M.D.   On: 12/31/2016 10:49   Dg C-arm Bronchoscopy  Result Date: 12/31/2016 C-ARM BRONCHOSCOPY: Fluoroscopy was utilized by the requesting physician.  No radiographic interpretation.       IMPRESSION/PLAN: 1. Stage IIIA, cT4N0M0, NSCLC favor adenocarcinoma of the LUL of the lung. Dr. Lisbeth Renshaw discusses the pathology findings and reviews the nature of locally advanced lung cancer. He discusses the discussion from thoracic oncology conference included obtaining a biopsy of her thyroid nodule that was seen on scan, and MRI of the brain for staging, and to proceed with PFTs and evaluation with CT surgery. If she is not a candidate for surgical resection, she would be a candidate for chemoRT. We discussed the risks, benefits, short, and long term effects of radiotherapy. Dr. Lisbeth Renshaw discusses the delivery and logistics of radiotherapy and anticipates a course of 6 1/2 weeks with concurrent chemotherapy under the care of Dr. Julien Nordmann. She is also contemplating treatment at another site, possibly  in [place] or in [place]. We will follow up with her course after meeting with CT surgery, and once she determines where she'd like treatment. 2. Hypermetabolic Thyroid Lesion. The patient is counseled as above to proceed with evaluation and biopsy of her thyroid lesion. This is being ordered by Dr. Julien Nordmann. 3. Rheumatoid Arthritis. Dr. Lisbeth Renshaw reviews  her medications and she would need to discontinue her Methotrexate and Plaquenil during radiotherapy. 4. Possible genetic predisposition to malignancy. The patient's family history is significant for colon and pancreatic cancer and she is interested in meeting with genetic testing. A referral is placed for consultation.   The above documentation reflects my direct findings during this shared patient visit. Please see the separate note by Dr. Lisbeth Renshaw on this date for the remainder of the patient's plan of care.    Carola Rhine, PAC

## 2017-01-23 ENCOUNTER — Ambulatory Visit: Payer: Commercial Managed Care - HMO | Admitting: Rheumatology

## 2017-01-23 ENCOUNTER — Encounter: Payer: Self-pay | Admitting: Internal Medicine

## 2017-01-27 ENCOUNTER — Telehealth: Payer: Self-pay | Admitting: Internal Medicine

## 2017-01-27 ENCOUNTER — Ambulatory Visit: Payer: Medicare HMO | Admitting: Emergency Medicine

## 2017-01-27 ENCOUNTER — Ambulatory Visit (HOSPITAL_COMMUNITY)
Admission: RE | Admit: 2017-01-27 | Discharge: 2017-01-27 | Disposition: A | Discharge status: Home / Self Care | Payer: Medicare Other | Source: Ambulatory Visit | Attending: Internal Medicine | Admitting: Internal Medicine

## 2017-01-27 DIAGNOSIS — C3490 Malignant neoplasm of unspecified part of unspecified bronchus or lung: Secondary | ICD-10-CM | POA: Diagnosis not present

## 2017-01-27 DIAGNOSIS — E041 Nontoxic single thyroid nodule: Secondary | ICD-10-CM

## 2017-01-27 MED ORDER — GADOBENATE DIMEGLUMINE 529 MG/ML IV SOLN
20.0000 mL | Freq: Once | INTRAVENOUS | Status: AC | PRN
  Administered 2017-01-27: 16 mL via INTRAVENOUS

## 2017-01-27 NOTE — Telephone Encounter (Signed)
Rob  You are seeing her 01/27/2017 for stage 3 NSCLC. She has PET HOT thyroid nodules as well. If you can arrange for FNA of this during your visit will be awesome. I do not think has been done. If you cannot order let me know and I can have Raquel Sarna place it  Thanks  Dr. Brand Males, M.D., [facility].C.P Pulmonary and Critical Care Medicine Staff Physician, [place] Director - Interstitial Lung Disease  Program  Pulmonary [place] at [place], [place], [address]  Pager: [phone], If no answer or between  15:00h - 7:00h: call [phone] Telephone: [phone]

## 2017-01-28 ENCOUNTER — Telehealth: Payer: Self-pay | Admitting: Internal Medicine

## 2017-01-28 NOTE — Telephone Encounter (Signed)
She needs IR referral for thyroid nodule biopsy.

## 2017-01-28 NOTE — Telephone Encounter (Signed)
called regarding 1/17

## 2017-01-29 ENCOUNTER — Ambulatory Visit (HOSPITAL_COMMUNITY)
Admission: RE | Admit: 2017-01-29 | Discharge: 2017-01-29 | Disposition: A | Discharge status: Home / Self Care | Payer: Medicare Other | Source: Ambulatory Visit | Attending: Internal Medicine | Admitting: Internal Medicine

## 2017-01-29 DIAGNOSIS — C3412 Malignant neoplasm of upper lobe, left bronchus or lung: Secondary | ICD-10-CM | POA: Diagnosis not present

## 2017-01-29 DIAGNOSIS — C3490 Malignant neoplasm of unspecified part of unspecified bronchus or lung: Secondary | ICD-10-CM

## 2017-01-29 LAB — PULMONARY FUNCTION TEST
DL/VA % PRED: 66 %
DL/VA: 3.15 ml/min/mmHg/L
DLCO COR % PRED: 73 %
DLCO COR: 17.32 ml/min/mmHg
DLCO unc % pred: 65 %
DLCO unc: 15.48 ml/min/mmHg
FEF 25-75 POST: 2.02 L/s
FEF 25-75 Pre: 1.92 L/sec
FEF2575-%CHANGE-POST: 5 %
FEF2575-%PRED-POST: 102 %
FEF2575-%Pred-Pre: 97 %
FEV1-%CHANGE-POST: 0 %
FEV1-%Pred-Post: 92 %
FEV1-%Pred-Pre: 91 %
FEV1-Post: 2.11 L
FEV1-Pre: 2.1 L
FEV1FVC-%CHANGE-POST: 0 %
FEV1FVC-%PRED-PRE: 102 %
FEV6-%Change-Post: -4 %
FEV6-%Pred-Post: 87 %
FEV6-%Pred-Pre: 92 %
FEV6-PRE: 2.66 L
FEV6-Post: 2.53 L
FEV6FVC-%Change-Post: 0 %
FEV6FVC-%Pred-Post: 104 %
FEV6FVC-%Pred-Pre: 103 %
FVC-%Change-Post: 0 %
FVC-%PRED-POST: 89 %
FVC-%PRED-PRE: 89 %
FVC-POST: 2.7 L
FVC-Pre: 2.69 L
PRE FEV1/FVC RATIO: 78 %
Post FEV1/FVC ratio: 78 %
Post FEV6/FVC ratio: 100 %
Pre FEV6/FVC Ratio: 99 %
RV % pred: 109 %
RV: 2.32 L
TLC % pred: 102 %
TLC: 5.1 L

## 2017-01-29 MED ORDER — ALBUTEROL SULFATE (2.5 MG/3ML) 0.083% IN NEBU
2.5000 mg | INHALATION_SOLUTION | Freq: Once | RESPIRATORY_TRACT | Status: AC
  Administered 2017-01-29: 2.5 mg via RESPIRATORY_TRACT

## 2017-01-30 ENCOUNTER — Encounter: Payer: Self-pay | Admitting: *Deleted

## 2017-01-30 ENCOUNTER — Other Ambulatory Visit: Payer: Self-pay | Admitting: *Deleted

## 2017-01-30 DIAGNOSIS — C3412 Malignant neoplasm of upper lobe, left bronchus or lung: Secondary | ICD-10-CM

## 2017-01-30 NOTE — Telephone Encounter (Signed)
If Dr Julien Nordmann is already addressing the thyroid issue then that is good and ball in his court  Dr. Brand Males, M.D., [facility].C.P Pulmonary and Critical Care Medicine Staff Physician, [place] Director - Interstitial Lung Disease  Program  Pulmonary [place] at [place], [place], [address]  Pager: [phone], If no answer or between  15:00h - 7:00h: call [phone] Telephone: [phone]

## 2017-01-30 NOTE — Progress Notes (Signed)
Oncology Nurse Navigator Documentation  Oncology Nurse Navigator Flowsheets 01/30/2017  Navigator Location [facility]  Navigator Encounter Type Telephone/I received a message from [place] regarding Ms. Leamy's follow up. I updated Dr. Julien Nordmann on patient's recent PFT's.  He spoke with Dr. Roxan Hockey, thoracic surgeon and he would like to see patient. I called and updated patient's husband.  He verbalized understanding. I completed referral in EMR and sent Dr. Leonarda Salon office a notification on referral. I will updated Rad Onc as well.   Telephone Outgoing Call  Treatment Phase Pre-Tx/Tx Discussion  Barriers/Navigation Needs Education;Coordination of Care  Education Other  Interventions Education;Coordination of Care  Coordination of Care Other  Education Method Verbal  Acuity Level 2  Time Spent with Patient 30

## 2017-01-30 NOTE — Telephone Encounter (Signed)
Spoke with Chantel from [facility] and order that was placed for pt to see IR was not the correct order.  An order was placed by Dr. Julien Nordmann for pt to have [place] core biopsy of thyroid.  This has not been scheduled yet for pt.  Dr. Lamonte Sakai and Dr. Chase Caller, please advise if this order is okay for pt.

## 2017-01-30 NOTE — Telephone Encounter (Signed)
Called pt letting her know that she was needing to refer to IR for thyroid nodule biopsy.  Pt expressed understanding. Nothing further needed.

## 2017-02-02 ENCOUNTER — Other Ambulatory Visit: Payer: Medicare HMO

## 2017-02-02 ENCOUNTER — Ambulatory Visit: Payer: Medicare HMO | Admitting: Internal Medicine

## 2017-02-02 ENCOUNTER — Encounter: Payer: Self-pay | Admitting: *Deleted

## 2017-02-02 DIAGNOSIS — C3412 Malignant neoplasm of upper lobe, left bronchus or lung: Secondary | ICD-10-CM

## 2017-02-02 NOTE — Progress Notes (Signed)
Oncology Nurse Navigator Documentation  Oncology Nurse Navigator Flowsheets 02/02/2017  Navigator Location [facility]  Navigator Encounter Type Other/I followed up on referral to [facility].  I did not see appt yet. I made another referral and will also contacted their office.   Abnormal Finding Date 10/14/2016  Treatment Phase Pre-Tx/Tx Discussion  Barriers/Navigation Needs Coordination of Care  Interventions Referrals  Coordination of Care Other  Acuity Level 1  Time Spent with Patient 15

## 2017-02-02 NOTE — Telephone Encounter (Signed)
Dr. Julien Nordmann, please advise if pt has been scheduled for the [place] core biopsy of her thyroid.  Thanks!

## 2017-02-02 NOTE — Telephone Encounter (Signed)
Yes. I did order biopsy of her thyroid. Thank you.

## 2017-02-05 ENCOUNTER — Encounter: Payer: Self-pay | Admitting: *Deleted

## 2017-02-05 ENCOUNTER — Encounter: Payer: Medicare Other | Admitting: Thoracic Surgery (Cardiothoracic Vascular Surgery)

## 2017-02-05 ENCOUNTER — Encounter: Payer: Self-pay | Admitting: Oncology

## 2017-02-05 ENCOUNTER — Telehealth: Payer: Self-pay | Admitting: Oncology

## 2017-02-05 ENCOUNTER — Inpatient Hospital Stay (HOSPITAL_BASED_OUTPATIENT_CLINIC_OR_DEPARTMENT_OTHER): Payer: Medicare Other | Admitting: Oncology

## 2017-02-05 VITALS — BP 122/71 | HR 101 | Temp 97.5°F | Resp 18 | Ht 64.0 in | Wt 174.5 lb

## 2017-02-05 DIAGNOSIS — C3412 Malignant neoplasm of upper lobe, left bronchus or lung: Secondary | ICD-10-CM | POA: Diagnosis not present

## 2017-02-05 NOTE — Progress Notes (Signed)
Oncology Nurse Navigator Documentation  Oncology Nurse Navigator Flowsheets 02/05/2017  Navigator Location [facility]  Navigator Encounter Type Clinic/MDC/I called [facility] to follow up on patient's appt with Dr. Roxan Hockey. I got clarification. I updated patient on the appt for tomorrow with Dr. Roxan Hockey. He verbalized understanding and location of appt.   Patient Visit Type MedOnc  Treatment Phase Pre-Tx/Tx Discussion  Barriers/Navigation Needs Education;Coordination of Care  Education Other  Interventions Coordination of Care;Education  Coordination of Care Other  Education Method Verbal  Acuity Level 2  Time Spent with Patient 30

## 2017-02-05 NOTE — Assessment & Plan Note (Signed)
This is a very pleasant 67 year old white female with a stage IIIA (T3, N1, M0) non-small cell lung cancer presented with 2 masses in the left upper lobe as well as questionable left hilar adenopathy diagnosed in December 2018. The patient had MRI of the brain which did not show any evidence of metastatic disease.  Results were discussed with the patient today.  The patient has an appointment scheduled with cardiothoracic surgery tomorrow to discuss possible resection.  We will await the recommendations from Dr. Roxan Hockey before we will schedule her back for a follow-up appointment.  If she proceed with surgery, we will follow postoperatively.  If she is not a surgical candidate, we will bring her back to discuss treatment with concurrent chemoradiation.  We will consider her for a course of concurrent chemoradiation with weekly carboplatin for AUC of 2 and paclitaxel 45 mg/M2 followed by consolidation immunotherapy if there is no evidence of disease progression.  For follow-up visit will be scheduled pending the recommendations of Dr. Roxan Hockey.  The patient was advised to call immediately if she has any concerning symptoms in the interval. The patient voices understanding of current disease status and treatment options and is in agreement with the current care plan.  All questions were answered. The patient knows to call the clinic with any problems, questions or concerns. We can certainly see the patient much sooner if necessary.

## 2017-02-05 NOTE — Progress Notes (Signed)
[facility] OFFICE PROGRESS NOTE  Jake Samples, PA-C [address] [place] [address]  DIAGNOSIS: Stage IIIA (T3, N1, M0) non-small cell lung cancer presented with 2 masses in the left upper lobe as well as questionable left hilar adenopathy diagnosed in December 2018.  PRIOR THERAPY: None  CURRENT THERAPY: The patient is scheduled to meet with cardiothoracic surgery for possible resection later this week.  INTERVAL HISTORY: Rose Lutz 67 y.o. female returns for routine follow-up visit by herself.  The patient is feeling well today and has no specific complaints.  She denies fevers and chills.  Denies chest pain, shortness breath, cough, hemoptysis.  Denies nausea, vomiting, constipation, diarrhea.  The patient is scheduled to be seen by cardiothoracic surgery tomorrow to discuss possible resection.  She had a recent MRI of the brain is here to discuss the results.  MEDICAL HISTORY: Past Medical History:  Diagnosis Date  . Asbestos exposure    Spot on lung  . Chronic pain    On hydrocodone  . Depression   . Dyspnea    on exertion  . Essential hypertension   . GERD (gastroesophageal reflux disease)   . Pulmonary asbestosis [facility])    Patient states mild  . Rheumatoid arthritis (HCC)    and osteoarthritis    ALLERGIES:  is allergic to quinine derivatives; sulfa antibiotics; and percocet [oxycodone-acetaminophen].  MEDICATIONS:  Current Outpatient Medications  Medication Sig Dispense Refill  . amitriptyline (ELAVIL) 100 MG tablet Take 100 mg by mouth at bedtime.    . Ascorbic Acid (VITAMIN C) 250 MG CHEW Chew 1 tablet by mouth daily.    . cetirizine (ZYRTEC) 10 MG tablet Take 10 mg by mouth at bedtime.    . diphenhydrAMINE (BENADRYL) 25 mg capsule Take 50-100 mg by mouth every 6 (six) hours as needed for itching or allergies.    . folic acid (FOLVITE) 1 MG tablet Take 1 tablet (1 mg total) by mouth daily. 90 tablet 4  .  HYDROcodone-acetaminophen (NORCO) 10-325 MG tablet Take 1 tablet by mouth every 6 (six) hours as needed for moderate pain.     . hydroxychloroquine (PLAQUENIL) 200 MG tablet Take 2 tablets (400 mg total) by mouth every evening. Monday through Friday    . losartan (COZAAR) 25 MG tablet Take 1 tablet by mouth daily.  2  . methotrexate (RHEUMATREX) 2.5 MG tablet TAKE 6 TABLETS ONE TIME WEEKLY ON TUESDAYS. CAUTION: CHEMOTHERAPY. PROTECT FROM LIGHT. 48 tablet 0  . montelukast (SINGULAIR) 10 MG tablet Take 10 mg by mouth at bedtime.     . Multiple Vitamin (MULTIVITAMIN WITH MINERALS) TABS tablet Take 1 tablet by mouth daily.    Marland Kitchen omeprazole (PRILOSEC) 20 MG capsule Take 20 mg by mouth daily.    . Vitamin D, Ergocalciferol, (DRISDOL) 50000 units CAPS capsule Take 1 capsule (50,000 Units total) by mouth every 7 (seven) days. 12 capsule 0   No current facility-administered medications for this visit.     SURGICAL HISTORY:  Past Surgical History:  Procedure Laterality Date  . ABDOMINAL HYSTERECTOMY    . BILATERAL OOPHORECTOMY    . CESAREAN SECTION     x3  . KNEE ARTHROPLASTY Right   . TONSILLECTOMY    . VIDEO BRONCHOSCOPY WITH ENDOBRONCHIAL NAVIGATION Left 12/31/2016   Procedure: VIDEO BRONCHOSCOPY WITH ENDOBRONCHIAL NAVIGATION, left upper lung lobe;  Surgeon: Collene Gobble, MD;  Location: [facility];  Service: Thoracic;  Laterality: Left;  . YAG LASER APPLICATION Right 25/36/6440  Procedure: YAG LASER APPLICATION;  Surgeon: Rutherford Guys, MD;  Location: [facility];  Service: Ophthalmology;  Laterality: Right;  . YAG LASER APPLICATION Left 16/10/9602   Procedure: YAG LASER APPLICATION;  Surgeon: Rutherford Guys, MD;  Location: [facility];  Service: Ophthalmology;  Laterality: Left;    REVIEW OF SYSTEMS:   Review of Systems  Constitutional: Negative for appetite change, chills, fatigue, fever and unexpected weight change.  HENT:   Negative for mouth sores, nosebleeds, sore throat and trouble swallowing.    Eyes: Negative for eye problems and icterus.  Respiratory: Negative for cough, hemoptysis, shortness of breath and wheezing.   Cardiovascular: Negative for chest pain and leg swelling.  Gastrointestinal: Negative for abdominal pain, constipation, diarrhea, nausea and vomiting.  Genitourinary: Negative for bladder incontinence, difficulty urinating, dysuria, frequency and hematuria.   Musculoskeletal: Negative for back pain, gait problem, neck pain and neck stiffness.  Skin: Negative for itching and rash.  Neurological: Negative for dizziness, extremity weakness, gait problem, headaches, light-headedness and seizures.  Hematological: Negative for adenopathy. Does not bruise/bleed easily.  Psychiatric/Behavioral: Negative for confusion, depression and sleep disturbance. The patient is not nervous/anxious.     PHYSICAL EXAMINATION:  Blood pressure 122/71, pulse (!) 101, temperature (!) 97.5 F (36.4 C), temperature source Oral, resp. rate 18, height 5\' 4"  (1.626 m), weight 174 lb 8 oz (79.2 kg), SpO2 100 %.  ECOG PERFORMANCE STATUS: 1 - Symptomatic but completely ambulatory  Physical Exam  Constitutional: Oriented to person, place, and time and well-developed, well-nourished, and in no distress. No distress.  HENT:  Head: Normocephalic and atraumatic.  Mouth/Throat: Oropharynx is clear and moist. No oropharyngeal exudate.  Eyes: Conjunctivae are normal. Right eye exhibits no discharge. Left eye exhibits no discharge. No scleral icterus.  Neck: Normal range of motion. Neck supple.  Cardiovascular: Normal rate, regular rhythm, normal heart sounds and intact distal pulses.   Pulmonary/Chest: Effort normal and breath sounds normal. No respiratory distress. No wheezes. No rales.  Abdominal: Soft. Bowel sounds are normal. Exhibits no distension and no mass. There is no tenderness.  Musculoskeletal: Normal range of motion. Exhibits no edema.  Lymphadenopathy:    No cervical adenopathy.   Neurological: Alert and oriented to person, place, and time. Exhibits normal muscle tone. Gait normal. Coordination normal.  Skin: Skin is warm and dry. No rash noted. Not diaphoretic. No erythema. No pallor.  Psychiatric: Mood, memory and judgment normal.  Vitals reviewed.  LABORATORY DATA: Lab Results  Component Value Date   WBC 10.3 01/22/2017   HGB 10.4 (L) 01/22/2017   HCT 33.6 (L) 01/22/2017   MCV 90.3 01/22/2017   PLT 328 01/22/2017      Chemistry      Component Value Date/Time   NA 139 01/22/2017 1244   K 4.3 01/22/2017 1244   CL 102 12/26/2016 1000   CO2 27 01/22/2017 1244   BUN 7.3 01/22/2017 1244   CREATININE 0.9 01/22/2017 1244      Component Value Date/Time   CALCIUM 9.3 01/22/2017 1244   ALKPHOS 144 01/22/2017 1244   AST 16 01/22/2017 1244   ALT 11 01/22/2017 1244   BILITOT 0.27 01/22/2017 1244       RADIOGRAPHIC STUDIES:  Mr Jeri Cos VW Contrast  Result Date: 01/27/2017 CLINICAL DATA:  Non-small cell lung cancer.  Staging. EXAM: MRI HEAD WITHOUT AND WITH CONTRAST TECHNIQUE: Multiplanar, multiecho pulse sequences of the brain and surrounding structures were obtained without and with intravenous contrast. CONTRAST:  95mL MULTIHANCE GADOBENATE DIMEGLUMINE 529  MG/ML IV SOLN COMPARISON:  None. FINDINGS: Brain: Diffusion imaging does not show any acute or subacute infarction. The brainstem and cerebellum are normal. Cerebral hemispheres show scattered foci of T2 and FLAIR signal in the white matter consistent with mild chronic small-vessel disease. No cortical or large vessel territory infarction. No evidence of primary or metastatic mass lesion, hemorrhage, hydrocephalus or extra-axial collection. Vascular: Major vessels at the base of the brain show flow. Skull and upper cervical spine: Negative Sinuses/Orbits: Clear/normal Other: None IMPRESSION: No evidence of metastatic disease. Mild chronic small-vessel ischemic change of the cerebral hemispheric white matter.  Electronically Signed   By: Nelson Chimes M.D.   On: 01/27/2017 20:38     ASSESSMENT/PLAN:  Primary malignant neoplasm of left upper lobe of lung [facility]) This is a very pleasant 67 year old white female with a stage IIIA (T3, N1, M0) non-small cell lung cancer presented with 2 masses in the left upper lobe as well as questionable left hilar adenopathy diagnosed in December 2018. The patient had MRI of the brain which did not show any evidence of metastatic disease.  Results were discussed with the patient today.  The patient has an appointment scheduled with cardiothoracic surgery tomorrow to discuss possible resection.  We will await the recommendations from Dr. Roxan Hockey before we will schedule her back for a follow-up appointment.  If she proceed with surgery, we will follow postoperatively.  If she is not a surgical candidate, we will bring her back to discuss treatment with concurrent chemoradiation.  We will consider her for a course of concurrent chemoradiation with weekly carboplatin for AUC of 2 and paclitaxel 45 mg/M2 followed by consolidation immunotherapy if there is no evidence of disease progression.  For follow-up visit will be scheduled pending the recommendations of Dr. Roxan Hockey.  The patient was advised to call immediately if she has any concerning symptoms in the interval. The patient voices understanding of current disease status and treatment options and is in agreement with the current care plan.  All questions were answered. The patient knows to call the clinic with any problems, questions or concerns. We can certainly see the patient much sooner if necessary.  No orders of the defined types were placed in this encounter.   Mikey Bussing, DNP, AGPCNP-BC, AOCNP 02/05/17

## 2017-02-05 NOTE — Telephone Encounter (Signed)
No 1/17 los

## 2017-02-06 ENCOUNTER — Institutional Professional Consult (permissible substitution): Payer: Medicare Other | Admitting: Thoracic Surgery (Cardiothoracic Vascular Surgery)

## 2017-02-06 ENCOUNTER — Encounter: Payer: Self-pay | Admitting: Thoracic Surgery (Cardiothoracic Vascular Surgery)

## 2017-02-06 VITALS — BP 93/68 | HR 100 | Resp 18 | Ht 63.5 in | Wt 174.0 lb

## 2017-02-06 DIAGNOSIS — C3492 Malignant neoplasm of unspecified part of left bronchus or lung: Secondary | ICD-10-CM | POA: Diagnosis not present

## 2017-02-06 NOTE — Progress Notes (Signed)
6 Minute Walk Test Results  Patient: Rose Lutz Date:  02/06/2017   Supplemental O2 during test? no      Baseline   End  Time   1620    1626 Heartrate  100    106 Dyspnea  no    Yes,  Fatigue  no    yes O2 sat   96%    93% Blood pressure 93/38    110/75   Patient ambulated at a steady pace for a total distance of 720 feet with no stops.  Ambulation was limited primarily due to right hip pain  Overall the test was tolerated well

## 2017-02-06 NOTE — Progress Notes (Signed)
PCP is Jake Samples, PA-C Referring Provider is Curt Bears, MD  Chief Complaint  Patient presents with  . Lung Lesion    Surgical eval, MR Brain 01/27/17, Lung BX 12/31/16, Pet Scan 11/21/2016, Chest CT 10/29/16     HPI: Rose Lutz is a 67 year old woman sent for consultation regarding possible resection for a new lung cancer.  Rose Lutz is a 67 year old woman with a history of asbestos exposure and remote tobacco abuse.  She has a 45-pack-year history of smoking although she quit 10 years ago.  She also has a history of hypertension, rheumatoid arthritis and osteoarthritis, chronic pain, depression, reflux, and dyspnea on exertion.  She has had chest x-rays periodically due to asbestos exposure when she worked at a [employer].  She moved to [place] about 5 years ago.  She had a chest x-ray in September for follow-up and it showed a hazy opacity in the left upper lobe.  A CT of the chest was done in October it showed multiple left upper lobe masses.  A PET/CT showed the masses were hypermetabolic  Dr. Lamonte Sakai did a bronchoscopy which was positive for non-small cell carcinoma but there was not sufficient tumor for molecular testing.  She does have a cough and has had some hemoptysis.  She complains of getting short of breath and lightheaded with exertion.  She says this can even happen with going from her living room to her kitchen, but does not happen all the time.  Most the time she can walk without any respiratory issues although she is significantly limited by her arthritis.  She has not been having chest pain, pressure, or tightness.  She denies wheezing.  She has developed "claw hands" over the past couple of months.  She says she has lost 23 pounds over the past 3 months by watching what she eats.  Past Medical History:  Diagnosis Date  . Asbestos exposure    Spot on lung  . Chronic pain    On hydrocodone  . Depression   . Dyspnea    on exertion  . Essential hypertension    . GERD (gastroesophageal reflux disease)   . Pulmonary asbestosis [facility])    Patient states mild  . Rheumatoid arthritis ([place])    and osteoarthritis    Past Surgical History:  Procedure Laterality Date  . ABDOMINAL HYSTERECTOMY    . BILATERAL OOPHORECTOMY    . CESAREAN SECTION     x3  . KNEE ARTHROPLASTY Right   . TONSILLECTOMY    . VIDEO BRONCHOSCOPY WITH ENDOBRONCHIAL NAVIGATION Left 12/31/2016   Procedure: VIDEO BRONCHOSCOPY WITH ENDOBRONCHIAL NAVIGATION, left upper lung lobe;  Surgeon: Collene Gobble, MD;  Location: [place];  Service: Thoracic;  Laterality: Left;  . YAG LASER APPLICATION Right 62/37/6283   Procedure: YAG LASER APPLICATION;  Surgeon: Rutherford Guys, MD;  Location: [facility];  Service: Ophthalmology;  Laterality: Right;  . YAG LASER APPLICATION Left 15/17/6160   Procedure: YAG LASER APPLICATION;  Surgeon: Rutherford Guys, MD;  Location: [facility];  Service: Ophthalmology;  Laterality: Left;    Family History  Problem Relation Age of Onset  . Breast cancer Mother   . Colon cancer Maternal Grandmother   . Pancreatic cancer Son     Social History Social History   Tobacco Use  . Smoking status: Former Smoker    Packs/day: 1.25    Years: 38.00    Pack years: 47.50    Types: Cigarettes    Start date: 01/21/1968  Last attempt to quit: 01/20/2006    Years since quitting: 11.0  . Smokeless tobacco: Never Used  Substance Use Topics  . Alcohol use: No    Alcohol/week: 0.0 oz  . Drug use: No    Current Outpatient Medications  Medication Sig Dispense Refill  . amitriptyline (ELAVIL) 100 MG tablet Take 100 mg by mouth at bedtime.    . Ascorbic Acid (VITAMIN C) 250 MG CHEW Chew 1 tablet by mouth daily.    . cetirizine (ZYRTEC) 10 MG tablet Take 10 mg by mouth at bedtime.    . diphenhydrAMINE (BENADRYL) 25 mg capsule Take 50-100 mg by mouth every 6 (six) hours as needed for itching or allergies.    . folic acid (FOLVITE) 1 MG tablet Take 1 tablet (1 mg total) by mouth  daily. 90 tablet 4  . HYDROcodone-acetaminophen (NORCO) 10-325 MG tablet Take 1 tablet by mouth every 6 (six) hours as needed for moderate pain.     . hydroxychloroquine (PLAQUENIL) 200 MG tablet Take 2 tablets (400 mg total) by mouth every evening. Monday through Friday    . losartan (COZAAR) 25 MG tablet Take 1 tablet by mouth daily.  2  . methotrexate (RHEUMATREX) 2.5 MG tablet TAKE 6 TABLETS ONE TIME WEEKLY ON TUESDAYS. CAUTION: CHEMOTHERAPY. PROTECT FROM LIGHT. 48 tablet 0  . montelukast (SINGULAIR) 10 MG tablet Take 10 mg by mouth at bedtime.     . Multiple Vitamin (MULTIVITAMIN WITH MINERALS) TABS tablet Take 1 tablet by mouth daily.    Marland Kitchen omeprazole (PRILOSEC) 20 MG capsule Take 20 mg by mouth daily.    . Vitamin D, Ergocalciferol, (DRISDOL) 50000 units CAPS capsule Take 1 capsule (50,000 Units total) by mouth every 7 (seven) days. 12 capsule 0   No current facility-administered medications for this visit.     Allergies  Allergen Reactions  . Quinine Derivatives Nausea And Vomiting  . Sulfa Antibiotics Nausea And Vomiting  . Percocet [Oxycodone-Acetaminophen] Other (See Comments)    Headahces    Review of Systems  Constitutional: Positive for fatigue and unexpected weight change (Claims all voluntary). Negative for appetite change.  HENT: Negative for trouble swallowing and voice change.   Eyes: Negative for visual disturbance.  Respiratory: Positive for cough and shortness of breath. Negative for wheezing.   Cardiovascular: Negative for chest pain.  Gastrointestinal: Negative for abdominal distention and blood in stool.  Genitourinary: Negative for difficulty urinating and dysuria.  Musculoskeletal: Positive for arthralgias and joint swelling.  Neurological: Negative for seizures, weakness and headaches.  Hematological: Bruises/bleeds easily.  Psychiatric/Behavioral: Positive for dysphoric mood.  All other systems reviewed and are negative.   BP 93/68   Pulse 100   Resp  18   Ht 5' 3.5" (1.613 m)   Wt 174 lb (78.9 kg)   SpO2 96% Comment: RA  BMI 30.34 kg/m  Physical Exam  Constitutional: She is oriented to person, place, and time. She appears well-developed and well-nourished. No distress.  HENT:  Head: Normocephalic and atraumatic.  Mouth/Throat: No oropharyngeal exudate.  Eyes: Conjunctivae and EOM are normal. No scleral icterus.  Neck: Neck supple. No thyromegaly present.  Cardiovascular: Normal rate and regular rhythm.  Murmur (2/6 systolic) heard. Pulmonary/Chest: Effort normal and breath sounds normal. No respiratory distress. She has no wheezes. She has no rales.  Abdominal: Soft. She exhibits no distension. There is no tenderness.  Musculoskeletal: She exhibits deformity. She exhibits no edema.  Lymphadenopathy:    She has no cervical adenopathy.  Neurological:  She is alert and oriented to person, place, and time. No cranial nerve deficit. She exhibits normal muscle tone.  Skin: Skin is warm.  Clubbing of nailbeds  Vitals reviewed.    Diagnostic Tests: CT CHEST WITHOUT CONTRAST  TECHNIQUE: Multidetector CT imaging of the chest was performed following the standard protocol without intravenous contrast. High resolution imaging of the lungs, as well as inspiratory and expiratory imaging, was performed.  COMPARISON:  None.  FINDINGS: Cardiovascular: Heart size is normal. There is no significant pericardial fluid, thickening or pericardial calcification. There is aortic atherosclerosis, as well as atherosclerosis of the great vessels of the mediastinum and the coronary arteries, including calcified atherosclerotic plaque in the left main, left anterior descending and right coronary arteries. Dilatation of the pulmonic trunk (3.6 cm in diameter).  Mediastinum/Nodes: No pathologically enlarged mediastinal lymph nodes. Prominent soft tissue in the left hilar region, likely reflective of underlying lymphadenopathy (poorly evaluated  on today's noncontrast CT examination). Esophagus is unremarkable in appearance. No axillary lymphadenopathy.  Lungs/Pleura: In the left lung there are multiple nodular and mass-like areas which correspond to the perceived abnormality on the recent chest x-ray. The largest of these is in the posterior aspect of the left upper lobe (axial image 44 of series 3 and sagittal image 76 of series 10) measuring 3.2 x 4.3 x 4.7 cm, with macrolobulated margins causing deformation of the adjacent major fissure which extends posteriorly toward the superior segment of the left lower lobe, without definite Asian directly into the superior segment of the left lower lobe. Notably, this lesion is centrally low-attenuation (16 HU), which could indicate internal necrosis. Several other smaller lesions are noted, including a 2.6 x 4.3 x 3.1 cm mass-like area in the posterior aspect of the left upper lobe (axial image 65 of series 3 and sagittal image 83 of series 10).  Upper Abdomen: Severe atrophy of the left kidney. Aortic atherosclerosis.  Musculoskeletal: There are no aggressive appearing lytic or blastic lesions noted in the visualized portions of the skeleton.  IMPRESSION: 1. Multiple unusual pulmonary nodules and masses in the left lung, the largest of which is in the posterior aspect of the left upper lobe, with potential internal necrosis. There may be some associated left hilar lymphadenopathy. Certainly, these findings are concerning for neoplasm. However, given the patient's history of rheumatoid arthritis, the possibility of necrobiotic nodules should also be considered. As either etiology would be hypermetabolic on PET-CT, PET-CT is not recommended at this time. Further evaluation with bronchoscopy is suggested. 2. Mild dilatation of the pulmonic trunk (3.6 cm in diameter), suggestive of pulmonary arterial hypertension. 3. Mild diffuse bronchial wall thickening with mild  centrilobular and paraseptal emphysema; imaging findings suggestive of underlying COPD. 4. Aortic atherosclerosis, in addition to left main and 3 vessel coronary artery disease. Please note that although the presence of coronary artery calcium documents the presence of coronary artery disease, the severity of this disease and any potential stenosis cannot be assessed on this non-gated CT examination. Assessment for potential risk factor modification, dietary therapy or pharmacologic therapy may be warranted, if clinically indicated. These results will be called to the ordering clinician or representative by the Radiologist Assistant, and communication documented in the PACS or zVision Dashboard.  Aortic Atherosclerosis (ICD10-I70.0) and Emphysema (ICD10-J43.9).   Electronically Signed   By: Vinnie Langton M.D.   On: 10/29/2016 15:34 NUCLEAR MEDICINE PET SKULL BASE TO THIGH  TECHNIQUE: 9.4 mCi F-18 FDG was injected intravenously. Full-ring PET imaging was performed from  the skull base to thigh after the radiotracer. CT data was obtained and used for attenuation correction and anatomic localization.  FASTING BLOOD GLUCOSE:  Value: 104 mg/dl  COMPARISON:  Chest CT on 10/29/2016  FINDINGS: NECK: No hypermetabolic lymph nodes. Right thyroid lobe nodule measuring approximately 2.4 cm shows hypermetabolic activity, with SUV max of 5.1. Thyroid carcinoma cannot be excluded.  CHEST: A persistent bilobed mass like opacity is seen in the posterior left upper lobe and lingula, which abuts the fissure. The more superior component measures 4.5 x 4.0 cm on image 23/18, and has SUV max of 17.9. The more inferior component measures 4.3 x 2.9 cm on image 33/8, and has SUV max of 24.6. This masslike opacity abuts the left hilum, however there is no separate hypermetabolic hilar or mediastinal lymph nodes.  Mild emphysema. A 4 mm nodule is seen in the lateral right middle lobe on  image 47/8. This shows no FDG uptake but is too small characterize by PET. No evidence of pleural effusion.  ABDOMEN/PELVIS: No abnormal hypermetabolic activity within the liver, pancreas, adrenal glands, or spleen. No hypermetabolic lymph nodes in the abdomen or pelvis.  Diffuse left renal parenchymal atrophy again noted. No evidence of hydronephrosis. Prior hysterectomy noted. Adnexal regions are unremarkable in appearance. Aortic atherosclerosis.  SKELETON: No focal hypermetabolic bone lesions to suggest skeletal metastasis.  IMPRESSION: Hypermetabolic bilobed masslike opacity in the posterior left upper lobe and lingula, which abuts the left hilum, and is suspicious for malignancy.  No other local or distant metastatic disease identified.  4 mm indeterminate right middle lobe pulmonary nodule, which is too small to characterize by PET. Recommend continued attention on follow-up imaging.  Hypermetabolic right thyroid lobe nodule. Thyroid carcinoma cannot be excluded. Consider further evaluation with thyroid ultrasound and fine needle aspiration.   Electronically Signed   By: Earle Gell M.D.   On: 11/21/2016 16:39 I personally reviewed the CT and PET/CT images and concur with the findings noted above.  Pulmonary function testing FVC 2.69 (89%) FEV1 2.10 (91%) FEV1 postbronchodilator 2.11 (92%) DLCO 15.48 (65%) DLCO corrected 17.32 (73%)  6-minute walk test Ambulated 720 feet, slow but steady pace,  Saturation dropped from 96-93%  Impression: Mrs. Rose Lutz is a 67 year old woman with newly diagnosed non-small cell carcinoma of the left lung.  She has 2 large left upper lobe masses 1 of which abuts the major fissure and the left lower lobe bronchus.  It is difficult to determine due to the size of the masses in the proximity of hilum but there does not appear to be any definite hilar nodal involvement.  Clinically her stage is T3N0 stage IIB.  I had a long  discussion with her regarding treatment options.  She understands that this will require multimodality treatment with some combination of chemotherapy, surgery and/or radiation therapy.  She understands that there is no guarantee of cure with any treatment regimen with a stage of disease.  I do believe a complete surgical resection would give her the best odds.  From a surgical perspective, she would require a pneumonectomy.  Her PFTs show good FEV1 although her diffusion capacity is not ideal, it is probably acceptable.  It is hard to square pulmonary function testing with some of her symptoms.  As I talk to her further, it seems that this may be more of an orthostatic lightheadedness rather than dyspnea due to the exertion of going from the living room to the kitchen.  She certainly did much better than  that with her 6-minute walk test here.  She did 720 feet which is less than ideal it was somewhat limited by her arthritis.  She did not experience any dyspnea and only had a minor decrease in her oxygen saturations.  I do think should be relatively high risk.  My biggest concern is her enlarged pulmonary artery.  I am concerned that can be a sign of underlying pulmonary hypertension.  If so she would not tolerate a pneumonectomy well at all.  I will order an echocardiogram to see if there is any evidence of elevated right heart or pulmonary artery pressures.  I did discuss the general nature of the procedure with the patient she understands this would be done in the operating room under general anesthesia.  We talked about the incisions to be used the expected hospital stay and the overall recovery.  She understands there will be significant functional limitation.  I informed her of the indications, risks, benefits, and alternatives.  She understands the risk include, but not limited to death, MI, DVT, PE, bleeding, possible need for transfusion, infection, cardiac arrhythmias, respiratory or renal failure,  as well as the possibility of other unforeseeable complications.  She wishes to think over her options.  She is planning to travel to [place] to see her family and discuss with them before making any decisions.  I did encourage her to go ahead and get the echocardiogram done so we can have that information.  I will plan to see her back again in 3 weeks to sit down and decide whether she would like to proceed with surgery.  If in the meantime she decides not to she will contact oncology and they will arrange to begin concurrent chemoradiation.  Melrose Nakayama, MD [facility] [phone]

## 2017-02-09 ENCOUNTER — Other Ambulatory Visit: Payer: Self-pay | Admitting: *Deleted

## 2017-02-09 DIAGNOSIS — R011 Cardiac murmur, unspecified: Secondary | ICD-10-CM

## 2017-02-09 DIAGNOSIS — R42 Dizziness and giddiness: Secondary | ICD-10-CM

## 2017-02-10 ENCOUNTER — Telehealth: Payer: Self-pay | Admitting: Internal Medicine

## 2017-02-10 NOTE — Telephone Encounter (Signed)
Spoke with patient, she was asking for a copy of her previous OV notes to be sent to [facility]. Advised patient that normally we have patient contact medical records but because she had only been seen twice, I could fax the records this one time. She verbalized understanding.   Will fax to [place].

## 2017-02-13 ENCOUNTER — Telehealth: Payer: Self-pay | Admitting: *Deleted

## 2017-02-13 DIAGNOSIS — I10 Essential (primary) hypertension: Secondary | ICD-10-CM | POA: Diagnosis not present

## 2017-02-13 DIAGNOSIS — C3412 Malignant neoplasm of upper lobe, left bronchus or lung: Secondary | ICD-10-CM

## 2017-02-13 DIAGNOSIS — C3492 Malignant neoplasm of unspecified part of left bronchus or lung: Secondary | ICD-10-CM | POA: Diagnosis not present

## 2017-02-13 DIAGNOSIS — M069 Rheumatoid arthritis, unspecified: Secondary | ICD-10-CM | POA: Diagnosis not present

## 2017-02-13 LAB — ACID FAST CULTURE WITH REFLEXED SENSITIVITIES (MYCOBACTERIA): Acid Fast Culture: NEGATIVE

## 2017-02-13 NOTE — Telephone Encounter (Signed)
Oncology Nurse Navigator Documentation  Oncology Nurse Navigator Flowsheets 02/13/2017  Navigator Location [facility]  Navigator Encounter Type Telephone/I called to follow up with [name] regarding her having an appt with another facility.  She states she has an appt but needs records faxed. I will update HIM on referral.   Telephone Outgoing Call  Treatment Phase Pre-Tx/Tx Discussion  Barriers/Navigation Needs Coordination of Care  Interventions Coordination of Care  Coordination of Care Other  Acuity Level 2  Acuity Level 2 Referrals such as genetics, survivorship  Time Spent with Patient 30

## 2017-02-16 ENCOUNTER — Telehealth: Payer: Self-pay | Admitting: Genetics

## 2017-02-16 ENCOUNTER — Ambulatory Visit: Payer: Medicare Other | Admitting: Emergency Medicine

## 2017-02-16 ENCOUNTER — Telehealth: Payer: Self-pay | Admitting: Internal Medicine

## 2017-02-16 NOTE — Telephone Encounter (Signed)
Faxed records to [facility] [phone]

## 2017-02-16 NOTE — Telephone Encounter (Signed)
I called patient because I had received a message that she would like to reschedule her genetic counseling appointment.  She told me she would like to cancel her appointment because she is going to be treated at [facility].

## 2017-02-17 ENCOUNTER — Other Ambulatory Visit (HOSPITAL_COMMUNITY): Payer: Medicare Other

## 2017-02-18 ENCOUNTER — Telehealth: Payer: Self-pay | Admitting: Rheumatology

## 2017-02-18 MED ORDER — HYDROXYCHLOROQUINE SULFATE 200 MG PO TABS: 400.0000 mg | ORAL_TABLET | Freq: Every evening | ORAL | 2 refills | Status: DC

## 2017-02-18 NOTE — Telephone Encounter (Signed)
Patient left a voicemail requesting a prescription refill and sent to [facility].  (she did not leave the name of what she needed refilled).

## 2017-02-18 NOTE — Telephone Encounter (Signed)
Patient need refill on PLQ  Last Visit: 10/10/16 Next Visit: 06/10/17 Labs: 01/22/17 stable PLQ Eye Exam 10/28/16 WNL  Okay to refill per Dr. Estanislado Pandy

## 2017-02-23 ENCOUNTER — Other Ambulatory Visit: Payer: Medicare Other

## 2017-02-23 ENCOUNTER — Encounter: Payer: Medicare Other | Admitting: Genetics

## 2017-02-24 ENCOUNTER — Telehealth: Payer: Self-pay | Admitting: *Deleted

## 2017-02-24 NOTE — Telephone Encounter (Signed)
Oncology Nurse Navigator Documentation  Oncology Nurse Navigator Flowsheets 02/24/2017  Navigator Location [facility]  Navigator Encounter Type Telephone/patient called. I called her back. She states medical records are not at [facility].  I called HIM dept to get an update and left a vm message for them to call me. I updated patient on phone number to call to HIM for further assistance.   Telephone Incoming Call;Outgoing Call  Treatment Phase Pre-Tx/Tx Discussion  Barriers/Navigation Needs Education  Education Other  Interventions Education  Education Method Verbal  Acuity Level 1  Time Spent with Patient 30

## 2017-02-25 ENCOUNTER — Telehealth: Payer: Self-pay | Admitting: *Deleted

## 2017-02-25 NOTE — Telephone Encounter (Signed)
Oncology Nurse Navigator Documentation  Oncology Nurse Navigator Flowsheets 02/25/2017  Navigator Location [facility]  Navigator Encounter Type Telephone/I called HIM and spoke with Maudie Mercury.  She faxed medical records to [facility] on 02/16/17 and received a confirmation fax back.  I asked to re-fax and she will do that today. I called Ms. Saliba and updated her that records were faxed on 02/16/17 and they will be re-faxed today.  I asked that she call with any concerns or questions.   Telephone Outgoing Call  Treatment Phase Pre-Tx/Tx Discussion  Barriers/Navigation Needs Education;Coordination of Care  Education Other  Interventions Coordination of Care;Education  Coordination of Care Other  Education Method Verbal  Acuity Level 2  Time Spent with Patient 30

## 2017-03-03 ENCOUNTER — Encounter: Payer: Medicare Other | Admitting: Thoracic Surgery (Cardiothoracic Vascular Surgery)

## 2017-03-04 ENCOUNTER — Telehealth: Payer: Self-pay | Admitting: Radiation Oncology

## 2017-03-04 NOTE — Telephone Encounter (Signed)
LM for the patient to find out if she was already receiving treatment elsewhere or if she needed to follow up to discuss options of treatment here.

## 2017-03-05 ENCOUNTER — Telehealth: Payer: Self-pay | Admitting: *Deleted

## 2017-03-05 DIAGNOSIS — Z79899 Other long term (current) drug therapy: Secondary | ICD-10-CM | POA: Diagnosis not present

## 2017-03-05 DIAGNOSIS — C3410 Malignant neoplasm of upper lobe, unspecified bronchus or lung: Secondary | ICD-10-CM | POA: Diagnosis not present

## 2017-03-05 DIAGNOSIS — Z87891 Personal history of nicotine dependence: Secondary | ICD-10-CM | POA: Diagnosis not present

## 2017-03-05 DIAGNOSIS — I10 Essential (primary) hypertension: Secondary | ICD-10-CM | POA: Diagnosis not present

## 2017-03-05 DIAGNOSIS — C3412 Malignant neoplasm of upper lobe, left bronchus or lung: Secondary | ICD-10-CM | POA: Diagnosis not present

## 2017-03-05 NOTE — Telephone Encounter (Signed)
Oncology Nurse Navigator Documentation  Oncology Nurse Navigator Flowsheets 03/05/2017  Navigator Location [facility]  Navigator Encounter Type Telephone/I called Ms. Raju to follow up regarding her appt at [facility].  I was unable to reach her but did leave a vm message for her to call me.   Telephone Outgoing Call  [employer] Needs Education  Education Other  Interventions Education  Education Method Verbal  Acuity Level 1  Time Spent with Patient 30

## 2017-03-09 ENCOUNTER — Telehealth: Payer: Self-pay | Admitting: Radiation Oncology

## 2017-03-09 ENCOUNTER — Telehealth: Payer: Self-pay | Admitting: *Deleted

## 2017-03-09 DIAGNOSIS — R911 Solitary pulmonary nodule: Secondary | ICD-10-CM | POA: Diagnosis not present

## 2017-03-09 DIAGNOSIS — K219 Gastro-esophageal reflux disease without esophagitis: Secondary | ICD-10-CM | POA: Diagnosis not present

## 2017-03-09 DIAGNOSIS — M069 Rheumatoid arthritis, unspecified: Secondary | ICD-10-CM | POA: Diagnosis not present

## 2017-03-09 DIAGNOSIS — C3400 Malignant neoplasm of unspecified main bronchus: Secondary | ICD-10-CM | POA: Diagnosis not present

## 2017-03-09 DIAGNOSIS — I10 Essential (primary) hypertension: Secondary | ICD-10-CM | POA: Diagnosis not present

## 2017-03-09 DIAGNOSIS — M1990 Unspecified osteoarthritis, unspecified site: Secondary | ICD-10-CM | POA: Diagnosis not present

## 2017-03-09 DIAGNOSIS — Z87891 Personal history of nicotine dependence: Secondary | ICD-10-CM | POA: Diagnosis not present

## 2017-03-09 DIAGNOSIS — G8929 Other chronic pain: Secondary | ICD-10-CM | POA: Diagnosis not present

## 2017-03-09 DIAGNOSIS — R918 Other nonspecific abnormal finding of lung field: Secondary | ICD-10-CM | POA: Diagnosis not present

## 2017-03-09 NOTE — Telephone Encounter (Addendum)
I called the patient to check on her status again. She's in [place] seeking a second opinion regarding treatment and is meeting with a medical oncologist today. I encouraged her to call us back if she chooses to pursue treatment in [place], or if she is planning to stick with treatment in [place].

## 2017-03-09 NOTE — Telephone Encounter (Signed)
Perfect, thank you

## 2017-03-09 NOTE — Telephone Encounter (Signed)
Oncology Nurse Navigator Documentation  Oncology Nurse Navigator Flowsheets 03/09/2017  Navigator Location [facility]  Navigator Encounter Type Telephone/I called Ms. Thaw to follow up and see if she has been seen at [facility]. I was unable to reach but did leave vm message for her to call me with my name and phone number  Telephone Outgoing Call  Barriers/Navigation Needs Education  Education Other  Interventions Education  Education Method Verbal  Acuity Level 1  Time Spent with Patient 15

## 2017-03-17 DIAGNOSIS — J989 Respiratory disorder, unspecified: Secondary | ICD-10-CM | POA: Diagnosis not present

## 2017-03-18 ENCOUNTER — Telehealth: Payer: Self-pay | Admitting: *Deleted

## 2017-03-18 NOTE — Telephone Encounter (Signed)
Oncology Nurse Navigator Documentation  Oncology Nurse Navigator Flowsheets 03/18/2017  Navigator Location [facility]  Navigator Encounter Type Telephone/I called Ms. Gum to check on her visit at [facility]. I was unable to reach her. I did leave a vm message with my name and phone number to call me with an update.   Telephone Outgoing Call  Barriers/Navigation Needs Coordination of Care  Interventions Coordination of Care  Time Spent with Patient 15

## 2017-03-19 ENCOUNTER — Telehealth: Payer: Self-pay | Admitting: Radiation Oncology

## 2017-03-19 NOTE — Telephone Encounter (Signed)
I spoke with the patient's daughter after I could not reach the patient, and asked her to have her mother call me.  Patient's daughter states that her mom is having a repeat biopsy at [facility] next Monday.  She believes that her mom has decided to stay in [place], but I will confirm this with the patient when she returns my call.

## 2017-03-23 DIAGNOSIS — Z882 Allergy status to sulfonamides status: Secondary | ICD-10-CM | POA: Diagnosis not present

## 2017-03-23 DIAGNOSIS — C3492 Malignant neoplasm of unspecified part of left bronchus or lung: Secondary | ICD-10-CM | POA: Diagnosis not present

## 2017-03-23 DIAGNOSIS — R918 Other nonspecific abnormal finding of lung field: Secondary | ICD-10-CM | POA: Diagnosis not present

## 2017-03-23 DIAGNOSIS — M1990 Unspecified osteoarthritis, unspecified site: Secondary | ICD-10-CM | POA: Diagnosis not present

## 2017-03-23 DIAGNOSIS — R590 Localized enlarged lymph nodes: Secondary | ICD-10-CM | POA: Diagnosis not present

## 2017-03-23 DIAGNOSIS — I10 Essential (primary) hypertension: Secondary | ICD-10-CM | POA: Diagnosis not present

## 2017-03-23 DIAGNOSIS — Z87891 Personal history of nicotine dependence: Secondary | ICD-10-CM | POA: Diagnosis not present

## 2017-03-23 DIAGNOSIS — K219 Gastro-esophageal reflux disease without esophagitis: Secondary | ICD-10-CM | POA: Diagnosis not present

## 2017-03-23 DIAGNOSIS — M069 Rheumatoid arthritis, unspecified: Secondary | ICD-10-CM | POA: Diagnosis not present

## 2017-03-23 DIAGNOSIS — Z888 Allergy status to other drugs, medicaments and biological substances status: Secondary | ICD-10-CM | POA: Diagnosis not present

## 2017-03-23 DIAGNOSIS — R911 Solitary pulmonary nodule: Secondary | ICD-10-CM | POA: Diagnosis not present

## 2017-03-23 DIAGNOSIS — J189 Pneumonia, unspecified organism: Secondary | ICD-10-CM | POA: Diagnosis not present

## 2017-03-24 ENCOUNTER — Other Ambulatory Visit: Payer: Self-pay | Admitting: *Deleted

## 2017-03-24 MED ORDER — FOLIC ACID 1 MG PO TABS: 1.0000 mg | ORAL_TABLET | Freq: Every day | ORAL | 4 refills | Status: AC

## 2017-03-24 MED ORDER — METHOTREXATE 2.5 MG PO TABS: ORAL_TABLET | ORAL | 0 refills | Status: DC

## 2017-03-24 NOTE — Telephone Encounter (Signed)
Last Visit: 10/10/16 Next Visit: 06/10/17 Labs: 01/22/17 stable  Okay to refill per Dr. Estanislado Pandy

## 2017-03-25 ENCOUNTER — Telehealth: Payer: Self-pay | Admitting: Radiation Oncology

## 2017-03-25 NOTE — Telephone Encounter (Signed)
I called the patient to find out if she had made a decision about her care. She is going to stay in [place] and continue her care at [facility]. I encouraged her to call us back if she had any changes in plans otherwise. I will inform our navigator as well.

## 2017-03-26 ENCOUNTER — Other Ambulatory Visit: Payer: Self-pay | Admitting: *Deleted

## 2017-03-26 MED ORDER — HYDROXYCHLOROQUINE SULFATE 200 MG PO TABS: 400.0000 mg | ORAL_TABLET | Freq: Every evening | ORAL | 0 refills | Status: DC

## 2017-03-26 NOTE — Telephone Encounter (Signed)
Refill request received via fax  Last Visit: 10/10/16 Next Visit: 06/10/17 Labs: 01/22/17 stable PLQ Eye Exam: 10/28/16 WNL  Okay to refill per Dr. Estanislado Pandy

## 2017-03-26 NOTE — Telephone Encounter (Signed)
Sounds good, thank you 

## 2017-03-30 DIAGNOSIS — C3400 Malignant neoplasm of unspecified main bronchus: Secondary | ICD-10-CM | POA: Insufficient documentation

## 2017-03-31 ENCOUNTER — Telehealth: Payer: Self-pay | Admitting: Rheumatology

## 2017-03-31 NOTE — Telephone Encounter (Signed)
[facility] requesting a call to verify directions for  rx of MTX dated 03/24/17.  Please use ref# [id number] when calling in.

## 2017-03-31 NOTE — Telephone Encounter (Signed)
Patient advised patient prescription was sent in on 03/26/17 and to contact pharmacy.

## 2017-03-31 NOTE — Telephone Encounter (Signed)
Patient left a message requesting refill on Plaquenil called into the mail order pharmacy. Please call with any concerns.

## 2017-03-31 NOTE — Telephone Encounter (Signed)
Spoke with [facility] and verified direction. They will process prescription and ship to patient.

## 2017-04-02 DIAGNOSIS — C3400 Malignant neoplasm of unspecified main bronchus: Secondary | ICD-10-CM | POA: Diagnosis not present

## 2017-04-13 DIAGNOSIS — I10 Essential (primary) hypertension: Secondary | ICD-10-CM | POA: Diagnosis not present

## 2017-04-13 DIAGNOSIS — K219 Gastro-esophageal reflux disease without esophagitis: Secondary | ICD-10-CM | POA: Diagnosis not present

## 2017-04-13 DIAGNOSIS — M069 Rheumatoid arthritis, unspecified: Secondary | ICD-10-CM | POA: Diagnosis not present

## 2017-04-13 DIAGNOSIS — G8929 Other chronic pain: Secondary | ICD-10-CM | POA: Diagnosis not present

## 2017-04-13 DIAGNOSIS — K5900 Constipation, unspecified: Secondary | ICD-10-CM | POA: Diagnosis not present

## 2017-04-13 DIAGNOSIS — Z87891 Personal history of nicotine dependence: Secondary | ICD-10-CM | POA: Diagnosis not present

## 2017-04-13 DIAGNOSIS — C3400 Malignant neoplasm of unspecified main bronchus: Secondary | ICD-10-CM | POA: Diagnosis not present

## 2017-04-13 DIAGNOSIS — Z882 Allergy status to sulfonamides status: Secondary | ICD-10-CM | POA: Diagnosis not present

## 2017-04-13 DIAGNOSIS — Z885 Allergy status to narcotic agent status: Secondary | ICD-10-CM | POA: Diagnosis not present

## 2017-04-13 DIAGNOSIS — Z79899 Other long term (current) drug therapy: Secondary | ICD-10-CM | POA: Diagnosis not present

## 2017-04-27 DIAGNOSIS — C3400 Malignant neoplasm of unspecified main bronchus: Secondary | ICD-10-CM | POA: Diagnosis not present

## 2017-04-27 DIAGNOSIS — Z5111 Encounter for antineoplastic chemotherapy: Secondary | ICD-10-CM | POA: Diagnosis not present

## 2017-05-10 ENCOUNTER — Other Ambulatory Visit: Payer: Self-pay | Admitting: Rheumatology

## 2017-05-12 DIAGNOSIS — C3400 Malignant neoplasm of unspecified main bronchus: Secondary | ICD-10-CM | POA: Diagnosis not present

## 2017-05-18 DIAGNOSIS — C762 Malignant neoplasm of abdomen: Secondary | ICD-10-CM | POA: Diagnosis not present

## 2017-05-18 DIAGNOSIS — C3400 Malignant neoplasm of unspecified main bronchus: Secondary | ICD-10-CM | POA: Diagnosis not present

## 2017-05-18 DIAGNOSIS — Z5111 Encounter for antineoplastic chemotherapy: Secondary | ICD-10-CM | POA: Diagnosis not present

## 2017-06-08 DIAGNOSIS — Z5111 Encounter for antineoplastic chemotherapy: Secondary | ICD-10-CM | POA: Diagnosis not present

## 2017-06-08 DIAGNOSIS — D598 Other acquired hemolytic anemias: Secondary | ICD-10-CM | POA: Diagnosis not present

## 2017-06-08 DIAGNOSIS — C3400 Malignant neoplasm of unspecified main bronchus: Secondary | ICD-10-CM | POA: Diagnosis not present

## 2017-06-10 ENCOUNTER — Ambulatory Visit: Payer: Commercial Managed Care - HMO | Admitting: Rheumatology

## 2017-06-11 DIAGNOSIS — D589 Hereditary hemolytic anemia, unspecified: Secondary | ICD-10-CM | POA: Insufficient documentation

## 2017-06-12 DIAGNOSIS — C3400 Malignant neoplasm of unspecified main bronchus: Secondary | ICD-10-CM | POA: Diagnosis not present

## 2017-06-12 DIAGNOSIS — D598 Other acquired hemolytic anemias: Secondary | ICD-10-CM | POA: Diagnosis not present

## 2017-06-16 ENCOUNTER — Other Ambulatory Visit: Payer: Self-pay | Admitting: Rheumatology

## 2017-06-20 DIAGNOSIS — Z85118 Personal history of other malignant neoplasm of bronchus and lung: Secondary | ICD-10-CM

## 2017-06-20 HISTORY — DX: Personal history of other malignant neoplasm of bronchus and lung: Z85.118

## 2017-06-22 DIAGNOSIS — C3400 Malignant neoplasm of unspecified main bronchus: Secondary | ICD-10-CM | POA: Diagnosis not present

## 2017-06-22 DIAGNOSIS — D598 Other acquired hemolytic anemias: Secondary | ICD-10-CM | POA: Diagnosis not present

## 2017-06-22 DIAGNOSIS — C762 Malignant neoplasm of abdomen: Secondary | ICD-10-CM | POA: Diagnosis not present

## 2017-06-24 DIAGNOSIS — M059 Rheumatoid arthritis with rheumatoid factor, unspecified: Secondary | ICD-10-CM | POA: Diagnosis not present

## 2017-06-24 DIAGNOSIS — M79671 Pain in right foot: Secondary | ICD-10-CM | POA: Diagnosis not present

## 2017-06-24 DIAGNOSIS — G629 Polyneuropathy, unspecified: Secondary | ICD-10-CM | POA: Diagnosis not present

## 2017-06-24 DIAGNOSIS — D598 Other acquired hemolytic anemias: Secondary | ICD-10-CM | POA: Diagnosis not present

## 2017-06-25 DIAGNOSIS — M069 Rheumatoid arthritis, unspecified: Secondary | ICD-10-CM | POA: Diagnosis not present

## 2017-06-25 DIAGNOSIS — Z96651 Presence of right artificial knee joint: Secondary | ICD-10-CM | POA: Diagnosis not present

## 2017-06-25 DIAGNOSIS — Z885 Allergy status to narcotic agent status: Secondary | ICD-10-CM | POA: Diagnosis not present

## 2017-06-25 DIAGNOSIS — D696 Thrombocytopenia, unspecified: Secondary | ICD-10-CM | POA: Diagnosis not present

## 2017-06-25 DIAGNOSIS — Z888 Allergy status to other drugs, medicaments and biological substances status: Secondary | ICD-10-CM | POA: Diagnosis not present

## 2017-06-25 DIAGNOSIS — K219 Gastro-esophageal reflux disease without esophagitis: Secondary | ICD-10-CM | POA: Diagnosis not present

## 2017-06-25 DIAGNOSIS — Z9889 Other specified postprocedural states: Secondary | ICD-10-CM | POA: Diagnosis not present

## 2017-06-25 DIAGNOSIS — I10 Essential (primary) hypertension: Secondary | ICD-10-CM | POA: Diagnosis not present

## 2017-06-25 DIAGNOSIS — Z83511 Family history of glaucoma: Secondary | ICD-10-CM | POA: Diagnosis not present

## 2017-06-25 DIAGNOSIS — Z882 Allergy status to sulfonamides status: Secondary | ICD-10-CM | POA: Diagnosis not present

## 2017-06-25 DIAGNOSIS — Z800 Family history of malignant neoplasm of digestive organs: Secondary | ICD-10-CM | POA: Diagnosis not present

## 2017-06-25 DIAGNOSIS — C3400 Malignant neoplasm of unspecified main bronchus: Secondary | ICD-10-CM | POA: Diagnosis not present

## 2017-06-25 DIAGNOSIS — Z7952 Long term (current) use of systemic steroids: Secondary | ICD-10-CM | POA: Diagnosis not present

## 2017-06-25 DIAGNOSIS — Z9089 Acquired absence of other organs: Secondary | ICD-10-CM | POA: Diagnosis not present

## 2017-06-25 DIAGNOSIS — Z87891 Personal history of nicotine dependence: Secondary | ICD-10-CM | POA: Diagnosis not present

## 2017-06-25 DIAGNOSIS — Z01818 Encounter for other preprocedural examination: Secondary | ICD-10-CM | POA: Diagnosis not present

## 2017-06-27 ENCOUNTER — Other Ambulatory Visit: Payer: Self-pay | Admitting: Rheumatology

## 2017-06-30 ENCOUNTER — Telehealth: Payer: Self-pay | Admitting: Rheumatology

## 2017-06-30 NOTE — Telephone Encounter (Signed)
Patient must be receiving immunosuppressive therapy for lung cancer.  She may not need any additional immunosuppression.  If she is not on immunosuppressive therapy then it would be okay to refill Plaquenil.

## 2017-06-30 NOTE — Telephone Encounter (Signed)
Patient states she has been in [place] for the last 4 months. Patient states she has been undergoing treatment for lung cancer. Patient states she is due to have her lung on 07/06/17. Patient states they are unsure if they are taking the whole thing or just part of it. Patient states this is taking place at [facility].   Last Visit: 10/10/16 Labs: 01/22/17 stable PLQ Eye Exam: 10/28/16 WNL Okay to refill PLQ?

## 2017-06-30 NOTE — Telephone Encounter (Signed)
Patient request refill on Plaquenil sent to [facility] in [place] on [address]. Patient has enough for tonight.

## 2017-07-01 MED ORDER — HYDROXYCHLOROQUINE SULFATE 200 MG PO TABS: 400.0000 mg | ORAL_TABLET | Freq: Every evening | ORAL | 0 refills | Status: DC

## 2017-07-01 NOTE — Telephone Encounter (Signed)
Patient states she is not receiving any immunosuppressive therapy. Patient states she was taken off of the MTX because of the chemotherapy. Will send refill of PLQ to the pharmacy.

## 2017-07-02 DIAGNOSIS — M069 Rheumatoid arthritis, unspecified: Secondary | ICD-10-CM | POA: Diagnosis not present

## 2017-07-02 DIAGNOSIS — Z1389 Encounter for screening for other disorder: Secondary | ICD-10-CM | POA: Diagnosis not present

## 2017-07-02 DIAGNOSIS — I10 Essential (primary) hypertension: Secondary | ICD-10-CM | POA: Diagnosis not present

## 2017-07-06 DIAGNOSIS — R0602 Shortness of breath: Secondary | ICD-10-CM | POA: Diagnosis not present

## 2017-07-06 DIAGNOSIS — R000 Tachycardia, unspecified: Secondary | ICD-10-CM | POA: Diagnosis not present

## 2017-07-06 DIAGNOSIS — I10 Essential (primary) hypertension: Secondary | ICD-10-CM | POA: Diagnosis not present

## 2017-07-06 DIAGNOSIS — R9431 Abnormal electrocardiogram [ECG] [EKG]: Secondary | ICD-10-CM | POA: Diagnosis not present

## 2017-07-06 DIAGNOSIS — Z01810 Encounter for preprocedural cardiovascular examination: Secondary | ICD-10-CM | POA: Diagnosis not present

## 2017-07-08 DIAGNOSIS — Z885 Allergy status to narcotic agent status: Secondary | ICD-10-CM | POA: Diagnosis not present

## 2017-07-08 DIAGNOSIS — D696 Thrombocytopenia, unspecified: Secondary | ICD-10-CM | POA: Diagnosis not present

## 2017-07-08 DIAGNOSIS — C3492 Malignant neoplasm of unspecified part of left bronchus or lung: Secondary | ICD-10-CM | POA: Diagnosis not present

## 2017-07-08 DIAGNOSIS — G8912 Acute post-thoracotomy pain: Secondary | ICD-10-CM | POA: Diagnosis not present

## 2017-07-08 DIAGNOSIS — C3490 Malignant neoplasm of unspecified part of unspecified bronchus or lung: Secondary | ICD-10-CM | POA: Diagnosis not present

## 2017-07-08 DIAGNOSIS — C771 Secondary and unspecified malignant neoplasm of intrathoracic lymph nodes: Secondary | ICD-10-CM | POA: Diagnosis not present

## 2017-07-08 DIAGNOSIS — Z87891 Personal history of nicotine dependence: Secondary | ICD-10-CM | POA: Diagnosis not present

## 2017-07-08 DIAGNOSIS — Z888 Allergy status to other drugs, medicaments and biological substances status: Secondary | ICD-10-CM | POA: Diagnosis not present

## 2017-07-08 DIAGNOSIS — R0902 Hypoxemia: Secondary | ICD-10-CM | POA: Diagnosis not present

## 2017-07-08 DIAGNOSIS — R110 Nausea: Secondary | ICD-10-CM | POA: Diagnosis not present

## 2017-07-08 DIAGNOSIS — M069 Rheumatoid arthritis, unspecified: Secondary | ICD-10-CM | POA: Diagnosis not present

## 2017-07-08 DIAGNOSIS — R739 Hyperglycemia, unspecified: Secondary | ICD-10-CM | POA: Diagnosis not present

## 2017-07-08 DIAGNOSIS — C3412 Malignant neoplasm of upper lobe, left bronchus or lung: Secondary | ICD-10-CM | POA: Diagnosis not present

## 2017-07-08 DIAGNOSIS — I10 Essential (primary) hypertension: Secondary | ICD-10-CM | POA: Diagnosis not present

## 2017-07-08 DIAGNOSIS — I951 Orthostatic hypotension: Secondary | ICD-10-CM | POA: Diagnosis not present

## 2017-07-08 DIAGNOSIS — R9431 Abnormal electrocardiogram [ECG] [EKG]: Secondary | ICD-10-CM | POA: Diagnosis not present

## 2017-07-08 DIAGNOSIS — Z9889 Other specified postprocedural states: Secondary | ICD-10-CM | POA: Diagnosis not present

## 2017-07-08 DIAGNOSIS — J90 Pleural effusion, not elsewhere classified: Secondary | ICD-10-CM | POA: Diagnosis not present

## 2017-07-08 DIAGNOSIS — R0689 Other abnormalities of breathing: Secondary | ICD-10-CM | POA: Diagnosis not present

## 2017-07-08 DIAGNOSIS — I491 Atrial premature depolarization: Secondary | ICD-10-CM | POA: Diagnosis not present

## 2017-07-08 DIAGNOSIS — J9811 Atelectasis: Secondary | ICD-10-CM | POA: Diagnosis not present

## 2017-07-08 DIAGNOSIS — Z77090 Contact with and (suspected) exposure to asbestos: Secondary | ICD-10-CM | POA: Diagnosis not present

## 2017-07-08 DIAGNOSIS — G620 Drug-induced polyneuropathy: Secondary | ICD-10-CM | POA: Diagnosis not present

## 2017-07-08 DIAGNOSIS — G8918 Other acute postprocedural pain: Secondary | ICD-10-CM | POA: Diagnosis not present

## 2017-07-08 DIAGNOSIS — Z882 Allergy status to sulfonamides status: Secondary | ICD-10-CM | POA: Diagnosis not present

## 2017-07-08 DIAGNOSIS — I517 Cardiomegaly: Secondary | ICD-10-CM | POA: Diagnosis not present

## 2017-07-08 DIAGNOSIS — Z902 Acquired absence of lung [part of]: Secondary | ICD-10-CM | POA: Diagnosis not present

## 2017-07-08 DIAGNOSIS — Z01818 Encounter for other preprocedural examination: Secondary | ICD-10-CM | POA: Diagnosis not present

## 2017-07-08 DIAGNOSIS — T451X5A Adverse effect of antineoplastic and immunosuppressive drugs, initial encounter: Secondary | ICD-10-CM | POA: Diagnosis not present

## 2017-07-08 DIAGNOSIS — R0989 Other specified symptoms and signs involving the circulatory and respiratory systems: Secondary | ICD-10-CM | POA: Diagnosis not present

## 2017-07-08 DIAGNOSIS — M1990 Unspecified osteoarthritis, unspecified site: Secondary | ICD-10-CM | POA: Diagnosis not present

## 2017-07-08 DIAGNOSIS — K219 Gastro-esophageal reflux disease without esophagitis: Secondary | ICD-10-CM | POA: Diagnosis not present

## 2017-07-08 DIAGNOSIS — Z96651 Presence of right artificial knee joint: Secondary | ICD-10-CM | POA: Diagnosis not present

## 2017-07-08 DIAGNOSIS — R000 Tachycardia, unspecified: Secondary | ICD-10-CM | POA: Diagnosis not present

## 2017-07-08 HISTORY — PX: LUNG CANCER SURGERY: SHX702

## 2017-07-16 DIAGNOSIS — I10 Essential (primary) hypertension: Secondary | ICD-10-CM | POA: Diagnosis not present

## 2017-07-16 DIAGNOSIS — C3402 Malignant neoplasm of left main bronchus: Secondary | ICD-10-CM | POA: Diagnosis not present

## 2017-07-16 DIAGNOSIS — Z87891 Personal history of nicotine dependence: Secondary | ICD-10-CM | POA: Diagnosis not present

## 2017-07-16 DIAGNOSIS — Z483 Aftercare following surgery for neoplasm: Secondary | ICD-10-CM | POA: Diagnosis not present

## 2017-07-16 DIAGNOSIS — T451X5D Adverse effect of antineoplastic and immunosuppressive drugs, subsequent encounter: Secondary | ICD-10-CM | POA: Diagnosis not present

## 2017-07-16 DIAGNOSIS — J61 Pneumoconiosis due to asbestos and other mineral fibers: Secondary | ICD-10-CM | POA: Diagnosis not present

## 2017-07-16 DIAGNOSIS — G8929 Other chronic pain: Secondary | ICD-10-CM | POA: Diagnosis not present

## 2017-07-16 DIAGNOSIS — C3490 Malignant neoplasm of unspecified part of unspecified bronchus or lung: Secondary | ICD-10-CM | POA: Diagnosis not present

## 2017-07-16 DIAGNOSIS — M069 Rheumatoid arthritis, unspecified: Secondary | ICD-10-CM | POA: Diagnosis not present

## 2017-07-16 DIAGNOSIS — G620 Drug-induced polyneuropathy: Secondary | ICD-10-CM | POA: Diagnosis not present

## 2017-07-16 DIAGNOSIS — M1990 Unspecified osteoarthritis, unspecified site: Secondary | ICD-10-CM | POA: Diagnosis not present

## 2017-07-17 DIAGNOSIS — Z483 Aftercare following surgery for neoplasm: Secondary | ICD-10-CM | POA: Diagnosis not present

## 2017-07-17 DIAGNOSIS — Z87891 Personal history of nicotine dependence: Secondary | ICD-10-CM | POA: Diagnosis not present

## 2017-07-17 DIAGNOSIS — G620 Drug-induced polyneuropathy: Secondary | ICD-10-CM | POA: Diagnosis not present

## 2017-07-17 DIAGNOSIS — C3402 Malignant neoplasm of left main bronchus: Secondary | ICD-10-CM | POA: Diagnosis not present

## 2017-07-17 DIAGNOSIS — I10 Essential (primary) hypertension: Secondary | ICD-10-CM | POA: Diagnosis not present

## 2017-07-17 DIAGNOSIS — M069 Rheumatoid arthritis, unspecified: Secondary | ICD-10-CM | POA: Diagnosis not present

## 2017-07-17 DIAGNOSIS — J61 Pneumoconiosis due to asbestos and other mineral fibers: Secondary | ICD-10-CM | POA: Diagnosis not present

## 2017-07-17 DIAGNOSIS — T451X5D Adverse effect of antineoplastic and immunosuppressive drugs, subsequent encounter: Secondary | ICD-10-CM | POA: Diagnosis not present

## 2017-07-20 DIAGNOSIS — T451X5D Adverse effect of antineoplastic and immunosuppressive drugs, subsequent encounter: Secondary | ICD-10-CM | POA: Diagnosis not present

## 2017-07-20 DIAGNOSIS — M069 Rheumatoid arthritis, unspecified: Secondary | ICD-10-CM | POA: Diagnosis not present

## 2017-07-20 DIAGNOSIS — J61 Pneumoconiosis due to asbestos and other mineral fibers: Secondary | ICD-10-CM | POA: Diagnosis not present

## 2017-07-20 DIAGNOSIS — Z483 Aftercare following surgery for neoplasm: Secondary | ICD-10-CM | POA: Diagnosis not present

## 2017-07-20 DIAGNOSIS — Z87891 Personal history of nicotine dependence: Secondary | ICD-10-CM | POA: Diagnosis not present

## 2017-07-20 DIAGNOSIS — I10 Essential (primary) hypertension: Secondary | ICD-10-CM | POA: Diagnosis not present

## 2017-07-20 DIAGNOSIS — C3402 Malignant neoplasm of left main bronchus: Secondary | ICD-10-CM | POA: Diagnosis not present

## 2017-07-20 DIAGNOSIS — G620 Drug-induced polyneuropathy: Secondary | ICD-10-CM | POA: Diagnosis not present

## 2017-07-21 DIAGNOSIS — Z87891 Personal history of nicotine dependence: Secondary | ICD-10-CM | POA: Diagnosis not present

## 2017-07-21 DIAGNOSIS — J61 Pneumoconiosis due to asbestos and other mineral fibers: Secondary | ICD-10-CM | POA: Diagnosis not present

## 2017-07-21 DIAGNOSIS — C3402 Malignant neoplasm of left main bronchus: Secondary | ICD-10-CM | POA: Diagnosis not present

## 2017-07-21 DIAGNOSIS — I10 Essential (primary) hypertension: Secondary | ICD-10-CM | POA: Diagnosis not present

## 2017-07-21 DIAGNOSIS — M069 Rheumatoid arthritis, unspecified: Secondary | ICD-10-CM | POA: Diagnosis not present

## 2017-07-21 DIAGNOSIS — G620 Drug-induced polyneuropathy: Secondary | ICD-10-CM | POA: Diagnosis not present

## 2017-07-21 DIAGNOSIS — Z483 Aftercare following surgery for neoplasm: Secondary | ICD-10-CM | POA: Diagnosis not present

## 2017-07-21 DIAGNOSIS — T451X5D Adverse effect of antineoplastic and immunosuppressive drugs, subsequent encounter: Secondary | ICD-10-CM | POA: Diagnosis not present

## 2017-07-22 DIAGNOSIS — Z483 Aftercare following surgery for neoplasm: Secondary | ICD-10-CM | POA: Diagnosis not present

## 2017-07-22 DIAGNOSIS — T451X5D Adverse effect of antineoplastic and immunosuppressive drugs, subsequent encounter: Secondary | ICD-10-CM | POA: Diagnosis not present

## 2017-07-22 DIAGNOSIS — M069 Rheumatoid arthritis, unspecified: Secondary | ICD-10-CM | POA: Diagnosis not present

## 2017-07-22 DIAGNOSIS — Z87891 Personal history of nicotine dependence: Secondary | ICD-10-CM | POA: Diagnosis not present

## 2017-07-22 DIAGNOSIS — J61 Pneumoconiosis due to asbestos and other mineral fibers: Secondary | ICD-10-CM | POA: Diagnosis not present

## 2017-07-22 DIAGNOSIS — I10 Essential (primary) hypertension: Secondary | ICD-10-CM | POA: Diagnosis not present

## 2017-07-22 DIAGNOSIS — C3402 Malignant neoplasm of left main bronchus: Secondary | ICD-10-CM | POA: Diagnosis not present

## 2017-07-22 DIAGNOSIS — G620 Drug-induced polyneuropathy: Secondary | ICD-10-CM | POA: Diagnosis not present

## 2017-07-24 DIAGNOSIS — C3402 Malignant neoplasm of left main bronchus: Secondary | ICD-10-CM | POA: Diagnosis not present

## 2017-07-24 DIAGNOSIS — G620 Drug-induced polyneuropathy: Secondary | ICD-10-CM | POA: Diagnosis not present

## 2017-07-24 DIAGNOSIS — Z483 Aftercare following surgery for neoplasm: Secondary | ICD-10-CM | POA: Diagnosis not present

## 2017-07-24 DIAGNOSIS — Z902 Acquired absence of lung [part of]: Secondary | ICD-10-CM | POA: Diagnosis not present

## 2017-07-24 DIAGNOSIS — I10 Essential (primary) hypertension: Secondary | ICD-10-CM | POA: Diagnosis not present

## 2017-07-24 DIAGNOSIS — T451X5D Adverse effect of antineoplastic and immunosuppressive drugs, subsequent encounter: Secondary | ICD-10-CM | POA: Diagnosis not present

## 2017-07-24 DIAGNOSIS — C3400 Malignant neoplasm of unspecified main bronchus: Secondary | ICD-10-CM | POA: Diagnosis not present

## 2017-07-24 DIAGNOSIS — Z87891 Personal history of nicotine dependence: Secondary | ICD-10-CM | POA: Diagnosis not present

## 2017-07-24 DIAGNOSIS — M069 Rheumatoid arthritis, unspecified: Secondary | ICD-10-CM | POA: Diagnosis not present

## 2017-07-24 DIAGNOSIS — J61 Pneumoconiosis due to asbestos and other mineral fibers: Secondary | ICD-10-CM | POA: Diagnosis not present

## 2017-07-27 DIAGNOSIS — C3402 Malignant neoplasm of left main bronchus: Secondary | ICD-10-CM | POA: Diagnosis not present

## 2017-07-27 DIAGNOSIS — M069 Rheumatoid arthritis, unspecified: Secondary | ICD-10-CM | POA: Diagnosis not present

## 2017-07-27 DIAGNOSIS — Z87891 Personal history of nicotine dependence: Secondary | ICD-10-CM | POA: Diagnosis not present

## 2017-07-27 DIAGNOSIS — T451X5D Adverse effect of antineoplastic and immunosuppressive drugs, subsequent encounter: Secondary | ICD-10-CM | POA: Diagnosis not present

## 2017-07-27 DIAGNOSIS — G620 Drug-induced polyneuropathy: Secondary | ICD-10-CM | POA: Diagnosis not present

## 2017-07-27 DIAGNOSIS — Z483 Aftercare following surgery for neoplasm: Secondary | ICD-10-CM | POA: Diagnosis not present

## 2017-07-27 DIAGNOSIS — J61 Pneumoconiosis due to asbestos and other mineral fibers: Secondary | ICD-10-CM | POA: Diagnosis not present

## 2017-07-27 DIAGNOSIS — I10 Essential (primary) hypertension: Secondary | ICD-10-CM | POA: Diagnosis not present

## 2017-07-28 DIAGNOSIS — Z87891 Personal history of nicotine dependence: Secondary | ICD-10-CM | POA: Diagnosis not present

## 2017-07-28 DIAGNOSIS — Z885 Allergy status to narcotic agent status: Secondary | ICD-10-CM | POA: Diagnosis not present

## 2017-07-28 DIAGNOSIS — Z883 Allergy status to other anti-infective agents status: Secondary | ICD-10-CM | POA: Diagnosis not present

## 2017-07-28 DIAGNOSIS — Z9889 Other specified postprocedural states: Secondary | ICD-10-CM | POA: Diagnosis not present

## 2017-07-28 DIAGNOSIS — Z882 Allergy status to sulfonamides status: Secondary | ICD-10-CM | POA: Diagnosis not present

## 2017-07-29 DIAGNOSIS — I10 Essential (primary) hypertension: Secondary | ICD-10-CM | POA: Diagnosis not present

## 2017-07-29 DIAGNOSIS — G620 Drug-induced polyneuropathy: Secondary | ICD-10-CM | POA: Diagnosis not present

## 2017-07-29 DIAGNOSIS — J61 Pneumoconiosis due to asbestos and other mineral fibers: Secondary | ICD-10-CM | POA: Diagnosis not present

## 2017-07-29 DIAGNOSIS — Z87891 Personal history of nicotine dependence: Secondary | ICD-10-CM | POA: Diagnosis not present

## 2017-07-29 DIAGNOSIS — T451X5D Adverse effect of antineoplastic and immunosuppressive drugs, subsequent encounter: Secondary | ICD-10-CM | POA: Diagnosis not present

## 2017-07-29 DIAGNOSIS — Z483 Aftercare following surgery for neoplasm: Secondary | ICD-10-CM | POA: Diagnosis not present

## 2017-07-29 DIAGNOSIS — M069 Rheumatoid arthritis, unspecified: Secondary | ICD-10-CM | POA: Diagnosis not present

## 2017-07-29 DIAGNOSIS — C3402 Malignant neoplasm of left main bronchus: Secondary | ICD-10-CM | POA: Diagnosis not present

## 2017-07-30 DIAGNOSIS — G620 Drug-induced polyneuropathy: Secondary | ICD-10-CM | POA: Diagnosis not present

## 2017-07-30 DIAGNOSIS — Z483 Aftercare following surgery for neoplasm: Secondary | ICD-10-CM | POA: Diagnosis not present

## 2017-07-30 DIAGNOSIS — Z87891 Personal history of nicotine dependence: Secondary | ICD-10-CM | POA: Diagnosis not present

## 2017-07-30 DIAGNOSIS — J61 Pneumoconiosis due to asbestos and other mineral fibers: Secondary | ICD-10-CM | POA: Diagnosis not present

## 2017-07-30 DIAGNOSIS — I10 Essential (primary) hypertension: Secondary | ICD-10-CM | POA: Diagnosis not present

## 2017-07-30 DIAGNOSIS — C3402 Malignant neoplasm of left main bronchus: Secondary | ICD-10-CM | POA: Diagnosis not present

## 2017-07-30 DIAGNOSIS — M069 Rheumatoid arthritis, unspecified: Secondary | ICD-10-CM | POA: Diagnosis not present

## 2017-07-30 DIAGNOSIS — T451X5D Adverse effect of antineoplastic and immunosuppressive drugs, subsequent encounter: Secondary | ICD-10-CM | POA: Diagnosis not present

## 2017-08-03 DIAGNOSIS — C3402 Malignant neoplasm of left main bronchus: Secondary | ICD-10-CM | POA: Diagnosis not present

## 2017-08-03 DIAGNOSIS — Z483 Aftercare following surgery for neoplasm: Secondary | ICD-10-CM | POA: Diagnosis not present

## 2017-08-03 DIAGNOSIS — Z87891 Personal history of nicotine dependence: Secondary | ICD-10-CM | POA: Diagnosis not present

## 2017-08-03 DIAGNOSIS — I10 Essential (primary) hypertension: Secondary | ICD-10-CM | POA: Diagnosis not present

## 2017-08-03 DIAGNOSIS — G620 Drug-induced polyneuropathy: Secondary | ICD-10-CM | POA: Diagnosis not present

## 2017-08-03 DIAGNOSIS — T451X5D Adverse effect of antineoplastic and immunosuppressive drugs, subsequent encounter: Secondary | ICD-10-CM | POA: Diagnosis not present

## 2017-08-03 DIAGNOSIS — J61 Pneumoconiosis due to asbestos and other mineral fibers: Secondary | ICD-10-CM | POA: Diagnosis not present

## 2017-08-03 DIAGNOSIS — M069 Rheumatoid arthritis, unspecified: Secondary | ICD-10-CM | POA: Diagnosis not present

## 2017-08-04 DIAGNOSIS — E785 Hyperlipidemia, unspecified: Secondary | ICD-10-CM | POA: Diagnosis not present

## 2017-08-04 DIAGNOSIS — D649 Anemia, unspecified: Secondary | ICD-10-CM | POA: Diagnosis not present

## 2017-08-04 DIAGNOSIS — R946 Abnormal results of thyroid function studies: Secondary | ICD-10-CM | POA: Diagnosis not present

## 2017-08-04 DIAGNOSIS — E7800 Pure hypercholesterolemia, unspecified: Secondary | ICD-10-CM | POA: Diagnosis not present

## 2017-08-04 DIAGNOSIS — I10 Essential (primary) hypertension: Secondary | ICD-10-CM | POA: Diagnosis not present

## 2017-08-05 DIAGNOSIS — I10 Essential (primary) hypertension: Secondary | ICD-10-CM | POA: Diagnosis not present

## 2017-08-05 DIAGNOSIS — M069 Rheumatoid arthritis, unspecified: Secondary | ICD-10-CM | POA: Diagnosis not present

## 2017-08-05 DIAGNOSIS — Z483 Aftercare following surgery for neoplasm: Secondary | ICD-10-CM | POA: Diagnosis not present

## 2017-08-05 DIAGNOSIS — Z87891 Personal history of nicotine dependence: Secondary | ICD-10-CM | POA: Diagnosis not present

## 2017-08-05 DIAGNOSIS — C3402 Malignant neoplasm of left main bronchus: Secondary | ICD-10-CM | POA: Diagnosis not present

## 2017-08-05 DIAGNOSIS — G620 Drug-induced polyneuropathy: Secondary | ICD-10-CM | POA: Diagnosis not present

## 2017-08-05 DIAGNOSIS — T451X5D Adverse effect of antineoplastic and immunosuppressive drugs, subsequent encounter: Secondary | ICD-10-CM | POA: Diagnosis not present

## 2017-08-05 DIAGNOSIS — J61 Pneumoconiosis due to asbestos and other mineral fibers: Secondary | ICD-10-CM | POA: Diagnosis not present

## 2017-08-06 DIAGNOSIS — M069 Rheumatoid arthritis, unspecified: Secondary | ICD-10-CM | POA: Diagnosis not present

## 2017-08-06 DIAGNOSIS — Z87891 Personal history of nicotine dependence: Secondary | ICD-10-CM | POA: Diagnosis not present

## 2017-08-06 DIAGNOSIS — I10 Essential (primary) hypertension: Secondary | ICD-10-CM | POA: Diagnosis not present

## 2017-08-06 DIAGNOSIS — Z483 Aftercare following surgery for neoplasm: Secondary | ICD-10-CM | POA: Diagnosis not present

## 2017-08-06 DIAGNOSIS — G620 Drug-induced polyneuropathy: Secondary | ICD-10-CM | POA: Diagnosis not present

## 2017-08-06 DIAGNOSIS — J61 Pneumoconiosis due to asbestos and other mineral fibers: Secondary | ICD-10-CM | POA: Diagnosis not present

## 2017-08-06 DIAGNOSIS — T451X5D Adverse effect of antineoplastic and immunosuppressive drugs, subsequent encounter: Secondary | ICD-10-CM | POA: Diagnosis not present

## 2017-08-06 DIAGNOSIS — C3402 Malignant neoplasm of left main bronchus: Secondary | ICD-10-CM | POA: Diagnosis not present

## 2017-08-10 DIAGNOSIS — G620 Drug-induced polyneuropathy: Secondary | ICD-10-CM | POA: Diagnosis not present

## 2017-08-10 DIAGNOSIS — C3402 Malignant neoplasm of left main bronchus: Secondary | ICD-10-CM | POA: Diagnosis not present

## 2017-08-10 DIAGNOSIS — Z483 Aftercare following surgery for neoplasm: Secondary | ICD-10-CM | POA: Diagnosis not present

## 2017-08-10 DIAGNOSIS — Z87891 Personal history of nicotine dependence: Secondary | ICD-10-CM | POA: Diagnosis not present

## 2017-08-10 DIAGNOSIS — T451X5D Adverse effect of antineoplastic and immunosuppressive drugs, subsequent encounter: Secondary | ICD-10-CM | POA: Diagnosis not present

## 2017-08-10 DIAGNOSIS — I10 Essential (primary) hypertension: Secondary | ICD-10-CM | POA: Diagnosis not present

## 2017-08-10 DIAGNOSIS — J61 Pneumoconiosis due to asbestos and other mineral fibers: Secondary | ICD-10-CM | POA: Diagnosis not present

## 2017-08-10 DIAGNOSIS — M069 Rheumatoid arthritis, unspecified: Secondary | ICD-10-CM | POA: Diagnosis not present

## 2017-08-11 DIAGNOSIS — G620 Drug-induced polyneuropathy: Secondary | ICD-10-CM | POA: Diagnosis not present

## 2017-08-11 DIAGNOSIS — T451X5D Adverse effect of antineoplastic and immunosuppressive drugs, subsequent encounter: Secondary | ICD-10-CM | POA: Diagnosis not present

## 2017-08-11 DIAGNOSIS — M069 Rheumatoid arthritis, unspecified: Secondary | ICD-10-CM | POA: Diagnosis not present

## 2017-08-11 DIAGNOSIS — Z87891 Personal history of nicotine dependence: Secondary | ICD-10-CM | POA: Diagnosis not present

## 2017-08-11 DIAGNOSIS — Z483 Aftercare following surgery for neoplasm: Secondary | ICD-10-CM | POA: Diagnosis not present

## 2017-08-11 DIAGNOSIS — I10 Essential (primary) hypertension: Secondary | ICD-10-CM | POA: Diagnosis not present

## 2017-08-11 DIAGNOSIS — J61 Pneumoconiosis due to asbestos and other mineral fibers: Secondary | ICD-10-CM | POA: Diagnosis not present

## 2017-08-11 DIAGNOSIS — C3402 Malignant neoplasm of left main bronchus: Secondary | ICD-10-CM | POA: Diagnosis not present

## 2017-08-12 DIAGNOSIS — C3402 Malignant neoplasm of left main bronchus: Secondary | ICD-10-CM | POA: Diagnosis not present

## 2017-08-12 DIAGNOSIS — T451X5D Adverse effect of antineoplastic and immunosuppressive drugs, subsequent encounter: Secondary | ICD-10-CM | POA: Diagnosis not present

## 2017-08-12 DIAGNOSIS — Z87891 Personal history of nicotine dependence: Secondary | ICD-10-CM | POA: Diagnosis not present

## 2017-08-12 DIAGNOSIS — I10 Essential (primary) hypertension: Secondary | ICD-10-CM | POA: Diagnosis not present

## 2017-08-12 DIAGNOSIS — G620 Drug-induced polyneuropathy: Secondary | ICD-10-CM | POA: Diagnosis not present

## 2017-08-12 DIAGNOSIS — M069 Rheumatoid arthritis, unspecified: Secondary | ICD-10-CM | POA: Diagnosis not present

## 2017-08-12 DIAGNOSIS — Z483 Aftercare following surgery for neoplasm: Secondary | ICD-10-CM | POA: Diagnosis not present

## 2017-08-12 DIAGNOSIS — J61 Pneumoconiosis due to asbestos and other mineral fibers: Secondary | ICD-10-CM | POA: Diagnosis not present

## 2017-08-15 DIAGNOSIS — M1990 Unspecified osteoarthritis, unspecified site: Secondary | ICD-10-CM | POA: Diagnosis not present

## 2017-08-15 DIAGNOSIS — C3490 Malignant neoplasm of unspecified part of unspecified bronchus or lung: Secondary | ICD-10-CM | POA: Diagnosis not present

## 2017-08-15 DIAGNOSIS — M069 Rheumatoid arthritis, unspecified: Secondary | ICD-10-CM | POA: Diagnosis not present

## 2017-08-15 DIAGNOSIS — G8929 Other chronic pain: Secondary | ICD-10-CM | POA: Diagnosis not present

## 2017-08-27 DIAGNOSIS — C3412 Malignant neoplasm of upper lobe, left bronchus or lung: Secondary | ICD-10-CM | POA: Diagnosis not present

## 2017-09-09 NOTE — Progress Notes (Signed)
Office Visit Note  Patient: Rose Lutz             Date of Birth: 04/30/1950           MRN: [id number]             PCP: Jake Samples, PA-C Referring: Jake Samples, [place]* Visit Date: 09/22/2017 Occupation: @GUAROCC @  Subjective:  Pain and swelling in both hand and left knee joint   History of Present Illness: Rose Lutz is a 67 y.o. female with history of seropositive rheumatoid arthritis and osteoarthritis.  She restarted on Plaquenil 200 mg BID M-F and methotrexate 6 tablets by mouth once a week 3 weeks ago after being cleared by her medical and surgical oncologist, Dr. Madelyn Flavors and [name] at [facility].  She reports since her last follow-up visit she had a left upper lobectomy as well as 3- 4 rounds of chemotherapy.  She states that her last follow-up with her oncologist was on 08/27/2017 and she was advised to resume her medications.  She states that her next appointment with Dr. Madelyn Flavors at [facility] is on 11/12/2017.  She reports at this time she does not need any more chemotherapy.  She reports that she has been taking NSAIDs more frequently due to increased pain in multiple joints. She states that she started having a flare 10 to 14 days ago.  She states she is having pain and swelling in both hands and left knee joint.  She states that the neuropathy following chemotherapy has been improving.  She denies any other joint pain or joint swelling at this time.  She states that she knows she needs a Plaquenil eye exam and has scheduled one in September 2019.    Activities of Daily Living:  Patient reports morning stiffness for 30 minutes.   Patient Denies nocturnal pain.  Difficulty dressing/grooming: Reports Difficulty climbing stairs: Reports Difficulty getting out of chair: Reports Difficulty using hands for taps, buttons, cutlery, and/or writing: Reports  Review of Systems  Constitutional: Positive for fatigue.  HENT: Positive for mouth dryness. Negative  for mouth sores and nose dryness.   Eyes: Negative for pain, visual disturbance and dryness.  Respiratory: Negative for cough, hemoptysis, shortness of breath and difficulty breathing.   Cardiovascular: Positive for swelling in legs/feet. Negative for chest pain, palpitations and hypertension.  Gastrointestinal: Negative for blood in stool, constipation and diarrhea.  Endocrine: Negative for increased urination.  Genitourinary: Negative for difficulty urinating and painful urination.  Musculoskeletal: Positive for arthralgias, joint pain, joint swelling and morning stiffness. Negative for myalgias, muscle weakness, muscle tenderness and myalgias.  Skin: Positive for hair loss. Negative for color change, pallor, rash, nodules/bumps, skin tightness, ulcers and sensitivity to sunlight.  Allergic/Immunologic: Negative for susceptible to infections.  Neurological: Positive for dizziness. Negative for numbness and weakness.  Hematological: Negative for swollen glands.  Psychiatric/Behavioral: Positive for depressed mood. Negative for sleep disturbance. The patient is not nervous/anxious.     PMFS History:  Patient Active Problem List   Diagnosis Date Noted  . Primary malignant neoplasm of left upper lobe of lung ([place]) 01/22/2017  . Family history of cancer 01/22/2017  . Mass of left lung 12/19/2016  . Rheumatoid arthritis involving multiple sites with positive rheumatoid factor ([place]) 05/06/2016  . High risk medications (not anticoagulants) long-term use 05/06/2016  . Primary osteoarthritis of both hands 05/06/2016  . Primary osteoarthritis of left knee 05/06/2016  . Pain in left ankle and joints of left  foot 05/06/2016  . Other fatigue 05/06/2016  . Dizziness and giddiness 02/05/2015  . HTN (hypertension) 02/05/2015    Past Medical History:  Diagnosis Date  . Asbestos exposure    Spot on lung  . Cancer ([place]) 06/2017   LUNG WAS REMOVED  . Chronic pain    On hydrocodone  . Depression     . Dyspnea    on exertion  . Essential hypertension   . GERD (gastroesophageal reflux disease)   . Pulmonary asbestosis [facility])    Patient states mild  . Rheumatoid arthritis (HCC)    and osteoarthritis    Family History  Problem Relation Age of Onset  . Breast cancer Mother   . Colon cancer Maternal Grandmother   . Pancreatic cancer Son    Past Surgical History:  Procedure Laterality Date  . ABDOMINAL HYSTERECTOMY    . BILATERAL OOPHORECTOMY    . CESAREAN SECTION     x3  . KNEE ARTHROPLASTY Right   . LUNG CANCER SURGERY Left 07/08/2017  . TONSILLECTOMY    . VIDEO BRONCHOSCOPY WITH ENDOBRONCHIAL NAVIGATION Left 12/31/2016   Procedure: VIDEO BRONCHOSCOPY WITH ENDOBRONCHIAL NAVIGATION, left upper lung lobe;  Surgeon: Collene Gobble, MD;  Location: [facility];  Service: Thoracic;  Laterality: Left;  . YAG LASER APPLICATION Right 52/84/1324   Procedure: YAG LASER APPLICATION;  Surgeon: Rutherford Guys, MD;  Location: [facility];  Service: Ophthalmology;  Laterality: Right;  . YAG LASER APPLICATION Left 40/10/2723   Procedure: YAG LASER APPLICATION;  Surgeon: Rutherford Guys, MD;  Location: [facility];  Service: Ophthalmology;  Laterality: Left;   Social History   Social History Narrative  . Not on file    Objective: Vital Signs: BP (!) 148/98 (BP Location: Left Arm, Patient Position: Sitting, Cuff Size: Normal)   Pulse 86   Ht 5\' 3"  (1.6 m)   Wt 166 lb 12.8 oz (75.7 kg)   BMI 29.55 kg/m    Physical Exam  Constitutional: She is oriented to person, place, and time. She appears well-developed and well-nourished.  HENT:  Head: Normocephalic and atraumatic.  Eyes: Conjunctivae and EOM are normal.  Neck: Normal range of motion.  Cardiovascular: Normal rate, regular rhythm, normal heart sounds and intact distal pulses.  Pulmonary/Chest: Effort normal and breath sounds normal.  Abdominal: Soft. Bowel sounds are normal.  Lymphadenopathy:    She has no cervical adenopathy.  Neurological: She  is alert and oriented to person, place, and time.  Skin: Skin is warm and dry. Capillary refill takes less than 2 seconds.  Psychiatric: She has a normal mood and affect. Her behavior is normal.  Nursing note and vitals reviewed.    Musculoskeletal Exam: C-spine limited ROM.  Thoracic kyphosis noted. Lumbar spine limited ROM.  No midline spinal tenderness.  No SI joint tenderness.  Left shoulder limited abduction.  Right shoulder full ROM.  Left elbow mild contracture.  Right elbow full ROM.  Limited ROM and synovial thickening of bilateral wrist joints.  Synovitis of right 2nd MCP and left 2nd and 3rd MCP joints.  PIP and DIP synovial thickening consistent with osteoarthritis.  Right knee total knee replacement mildly warm.  Left knee warmth noted.  Left knee crepitus and discomfort with ROM.  Pedal edema bilaterally.   CDAI Exam: CDAI Score: 9.4  Patient Global Assessment: 7 (mm); Provider Global Assessment: 7 (mm) Swollen: 4 ; Tender: 4  Joint Exam      Right  Left  MCP 2  Swollen Tender  Swollen Tender  MCP 3     Swollen Tender  Knee     Swollen Tender     Investigation: No additional findings.  Imaging: No results found.  Recent Labs: Lab Results  Component Value Date   WBC 7.7 09/11/2017   HGB 9.3 (L) 09/11/2017   PLT 264 09/11/2017   NA 140 09/11/2017   K 4.1 09/11/2017   CL 102 09/11/2017   CO2 23 09/11/2017   GLUCOSE 97 09/11/2017   BUN 14 09/11/2017   CREATININE 1.28 (H) 09/11/2017   BILITOT <0.2 09/11/2017   ALKPHOS 139 (H) 09/11/2017   AST 21 09/11/2017   ALT 15 09/11/2017   PROT 6.7 09/11/2017   ALBUMIN 3.5 (L) 09/11/2017   CALCIUM 9.6 09/11/2017   GFRAA 50 (L) 09/11/2017    Speciality Comments: No specialty comments available.  Procedures:  No procedures performed Allergies: Quinine derivatives; Sulfa antibiotics; and Percocet [oxycodone-acetaminophen]   Assessment / Plan:     Visit Diagnoses: Rheumatoid arthritis involving multiple sites with  positive rheumatoid factor (HCC) - +RF, +CCP, +ANA, erosive, contractures: She has synovitis of several MCP joints as described above.  She has left knee warmth and swelling.  She has been having a rheumatoid arthritis flare for the past 2 weeks.  She has been taking OTC NSAIDs for pain relief.  Per patient she was cleared by her medical oncologist Dr. Madelyn Flavors at [facility] to restart on MTX and PLQ.  She has been taking MTX 6 tablets by mouth once a week and PLQ 200 mg BID M-F for the past 3 weeks.  We requested the patient to have medical records faxed to [place] regarding her clearance to restart on MTX.  Dr. Madelyn Flavors is her medical oncologist and [name] is her surgical oncologist. She was given a prescription for prednisone 10 mg tapering by 2.5 mg every week. A low dose taper was provided due to elevated BP in the office today.  She will monitor BP at home.  She was also advised to have a PLQ eye exam performed.  She was given a PLQ eye exam form today in the office. She was advised to notify us if she develops increased joint pain or joint swelling.  She will follow up in the office in 3 months.   High risk medication use - PLQ, MTX, folic acid. eye exam schedule this month.  She was given a PLQ eye exam form.  Creatinine elevated on 09/11/17.  Chronic anemia noted.    Finger clubbing  Primary osteoarthritis of left knee: Warmth and tenderness on exam.  She had discomfort with ROM and bearing weight.  She was given a prednisone taper in the office today.   History of total right knee replacement - Doing well.  Mild warmth on exam. She's followed by Dr. Lorin Mercy.  History of anemia: Hgb 9.3 on 09/11/17.   Other medical conditions are listed as follows:   History of gastroesophageal reflux (GERD)  History of hypertension  Decreased GFR   Orders: No orders of the defined types were placed in this encounter.  Meds ordered this encounter  Medications  . predniSONE (DELTASONE) 5 MG tablet    Sig:  Take 2 tablets by mouth daily x 1wk, take 1.5 tablets by mouth daily x 1wk, 1 tablet by mouth daily x 1wk, 0.5 tablet by mouth daily x1wk    Dispense:  35 tablet    Refill:  0    Face-to-face time spent with patient was  30 minutes. Greater than 50% of time was spent in counseling and coordination of care.  Follow-Up Instructions: Return in about 3 months (around 12/22/2017) for Rheumatoid arthritis, Osteoarthritis.   Ofilia Neas, PA-C   I examined and evaluated the patient with Hazel Sams PA.  Patient returns today after finishing chemotherapy and lobectomy for lung cancer.  Her rheumatoid arthritis was quite well controlled with chemotherapy so far.  Now her joints are flaring.  She has synovitis on my examination of her bilateral hands and her knee joints.  I will give her low-dose prednisone.  She has resumed methotrexate and Plaquenil.  I also advised to just get a note from her oncologist if it is okay to continue with methotrexate.  She will have it forwarded to [place].  The plan of care was discussed as noted above.  Bo Merino, MD  Note - This record has been created using Editor, commissioning.  Chart creation errors have been sought, but may not always  have been located. Such creation errors do not reflect on  the standard of medical care.

## 2017-09-10 ENCOUNTER — Telehealth: Payer: Self-pay | Admitting: Rheumatology

## 2017-09-10 DIAGNOSIS — Z79899 Other long term (current) drug therapy: Secondary | ICD-10-CM

## 2017-09-10 NOTE — Telephone Encounter (Signed)
Lab Orders released.  

## 2017-09-10 NOTE — Telephone Encounter (Signed)
Patient would like to get lab orders sent to [facility] in [place] on [address]. Patient going tomorrow.

## 2017-09-11 ENCOUNTER — Telehealth: Payer: Self-pay | Admitting: Rheumatology

## 2017-09-11 DIAGNOSIS — M069 Rheumatoid arthritis, unspecified: Secondary | ICD-10-CM | POA: Diagnosis not present

## 2017-09-11 DIAGNOSIS — L821 Other seborrheic keratosis: Secondary | ICD-10-CM | POA: Diagnosis not present

## 2017-09-11 DIAGNOSIS — Z79899 Other long term (current) drug therapy: Secondary | ICD-10-CM | POA: Diagnosis not present

## 2017-09-11 DIAGNOSIS — L918 Other hypertrophic disorders of the skin: Secondary | ICD-10-CM | POA: Diagnosis not present

## 2017-09-11 NOTE — Telephone Encounter (Signed)
Attempted to contact the [employer] and was sent to customer service as the [employer] was closed. Will contact on Monday.

## 2017-09-11 NOTE — Telephone Encounter (Signed)
[name] from [facility] in [place] left a voicemail stating she had a question about patient's standing order for labs.  Please call [phone]

## 2017-09-12 LAB — CBC WITH DIFFERENTIAL/PLATELET
BASOS ABS: 0 10*3/uL (ref 0.0–0.2)
BASOS: 0 %
EOS (ABSOLUTE): 0.1 10*3/uL (ref 0.0–0.4)
Eos: 2 %
Hematocrit: 30.5 % — ABNORMAL LOW (ref 34.0–46.6)
Hemoglobin: 9.3 g/dL — ABNORMAL LOW (ref 11.1–15.9)
IMMATURE GRANULOCYTES: 0 %
Immature Grans (Abs): 0 10*3/uL (ref 0.0–0.1)
Lymphocytes Absolute: 1.7 10*3/uL (ref 0.7–3.1)
Lymphs: 23 %
MCH: 29.7 pg (ref 26.6–33.0)
MCHC: 30.5 g/dL — ABNORMAL LOW (ref 31.5–35.7)
MCV: 97 fL (ref 79–97)
MONOS ABS: 0.9 10*3/uL (ref 0.1–0.9)
Monocytes: 12 %
NEUTROS PCT: 63 %
Neutrophils Absolute: 4.9 10*3/uL (ref 1.4–7.0)
PLATELETS: 264 10*3/uL (ref 150–450)
RBC: 3.13 x10E6/uL — ABNORMAL LOW (ref 3.77–5.28)
RDW: 15.5 % — AB (ref 12.3–15.4)
WBC: 7.7 10*3/uL (ref 3.4–10.8)

## 2017-09-12 LAB — CMP14+EGFR
A/G RATIO: 1.1 — AB (ref 1.2–2.2)
ALK PHOS: 139 IU/L — AB (ref 39–117)
ALT: 15 IU/L (ref 0–32)
AST: 21 IU/L (ref 0–40)
Albumin: 3.5 g/dL — ABNORMAL LOW (ref 3.6–4.8)
BUN/Creatinine Ratio: 11 — ABNORMAL LOW (ref 12–28)
BUN: 14 mg/dL (ref 8–27)
CALCIUM: 9.6 mg/dL (ref 8.7–10.3)
CO2: 23 mmol/L (ref 20–29)
Chloride: 102 mmol/L (ref 96–106)
Creatinine, Ser: 1.28 mg/dL — ABNORMAL HIGH (ref 0.57–1.00)
GFR calc Af Amer: 50 mL/min/{1.73_m2} — ABNORMAL LOW (ref >59)
GFR, EST NON AFRICAN AMERICAN: 43 mL/min/{1.73_m2} — AB (ref >59)
Globulin, Total: 3.2 g/dL (ref 1.5–4.5)
Glucose: 97 mg/dL (ref 65–99)
POTASSIUM: 4.1 mmol/L (ref 3.5–5.2)
SODIUM: 140 mmol/L (ref 134–144)
Total Protein: 6.7 g/dL (ref 6.0–8.5)

## 2017-09-14 NOTE — Telephone Encounter (Signed)
I will discuss at the fu visit.

## 2017-09-14 NOTE — Telephone Encounter (Signed)
Contacted the lab and the lab tech stated she was not in the office on Friday. She took information and will call back once when has been able to look into it.

## 2017-09-15 DIAGNOSIS — M1990 Unspecified osteoarthritis, unspecified site: Secondary | ICD-10-CM | POA: Diagnosis not present

## 2017-09-15 DIAGNOSIS — M069 Rheumatoid arthritis, unspecified: Secondary | ICD-10-CM | POA: Diagnosis not present

## 2017-09-15 DIAGNOSIS — C3490 Malignant neoplasm of unspecified part of unspecified bronchus or lung: Secondary | ICD-10-CM | POA: Diagnosis not present

## 2017-09-15 DIAGNOSIS — G8929 Other chronic pain: Secondary | ICD-10-CM | POA: Diagnosis not present

## 2017-09-17 NOTE — Patient Instructions (Addendum)
*  Your next Plaquenil eye exam is overdue. Please schedule appointment with your opthalmologist at your earliest convenience. *  Standing Labs We placed an order today for your standing lab work.    Please come back and get your standing labs in November and every 3 months   We have open lab Monday through Friday from 8:30-11:30 AM and 1:30-4:00 PM  at the office of Dr. Bo Merino.   You may experience shorter wait times on Monday and Friday afternoons. The office is located at [address], [place], [place], [place] [address] No appointment is necessary.   Labs are drawn by [facility].  You may receive a bill from [facility] for your lab work. If you have any questions regarding directions or hours of operation,  please call [phone].

## 2017-09-22 ENCOUNTER — Ambulatory Visit: Payer: Medicare Other | Admitting: Rheumatology

## 2017-09-22 ENCOUNTER — Encounter: Payer: Self-pay | Admitting: Rheumatology

## 2017-09-22 VITALS — BP 148/98 | HR 86 | Ht 63.0 in | Wt 166.8 lb

## 2017-09-22 DIAGNOSIS — R944 Abnormal results of kidney function studies: Secondary | ICD-10-CM

## 2017-09-22 DIAGNOSIS — M0579 Rheumatoid arthritis with rheumatoid factor of multiple sites without organ or systems involvement: Secondary | ICD-10-CM | POA: Diagnosis not present

## 2017-09-22 DIAGNOSIS — Z79899 Other long term (current) drug therapy: Secondary | ICD-10-CM | POA: Diagnosis not present

## 2017-09-22 DIAGNOSIS — Z8719 Personal history of other diseases of the digestive system: Secondary | ICD-10-CM

## 2017-09-22 DIAGNOSIS — Z96651 Presence of right artificial knee joint: Secondary | ICD-10-CM

## 2017-09-22 DIAGNOSIS — C3412 Malignant neoplasm of upper lobe, left bronchus or lung: Secondary | ICD-10-CM

## 2017-09-22 DIAGNOSIS — M1712 Unilateral primary osteoarthritis, left knee: Secondary | ICD-10-CM | POA: Diagnosis not present

## 2017-09-22 DIAGNOSIS — Z8679 Personal history of other diseases of the circulatory system: Secondary | ICD-10-CM

## 2017-09-22 DIAGNOSIS — R683 Clubbing of fingers: Secondary | ICD-10-CM

## 2017-09-22 DIAGNOSIS — Z862 Personal history of diseases of the blood and blood-forming organs and certain disorders involving the immune mechanism: Secondary | ICD-10-CM

## 2017-09-22 MED ORDER — PREDNISONE 5 MG PO TABS: ORAL_TABLET | ORAL | 0 refills | Status: DC

## 2017-09-23 ENCOUNTER — Telehealth: Payer: Self-pay | Admitting: Physician Assistant

## 2017-09-23 NOTE — Telephone Encounter (Signed)
I called patient to notify her that we received a note from Dr. Madelyn Flavors that stated he was ok with her proceeding to take MTX.  We feel comfortable with her continuing now that she has clearance by Dr. Madelyn Flavors.

## 2017-10-16 DIAGNOSIS — M069 Rheumatoid arthritis, unspecified: Secondary | ICD-10-CM | POA: Diagnosis not present

## 2017-10-16 DIAGNOSIS — C3490 Malignant neoplasm of unspecified part of unspecified bronchus or lung: Secondary | ICD-10-CM | POA: Diagnosis not present

## 2017-10-16 DIAGNOSIS — G8929 Other chronic pain: Secondary | ICD-10-CM | POA: Diagnosis not present

## 2017-10-16 DIAGNOSIS — M1990 Unspecified osteoarthritis, unspecified site: Secondary | ICD-10-CM | POA: Diagnosis not present

## 2017-10-26 ENCOUNTER — Other Ambulatory Visit: Payer: Self-pay | Admitting: Rheumatology

## 2017-10-26 NOTE — Telephone Encounter (Signed)
Patient called requesting prescription refill of Plaquenil and Methotrexate to be sent to [facility] at [address] in [place]

## 2017-10-27 MED ORDER — HYDROXYCHLOROQUINE SULFATE 200 MG PO TABS: 400.0000 mg | ORAL_TABLET | Freq: Every evening | ORAL | 0 refills | Status: DC

## 2017-10-27 NOTE — Telephone Encounter (Signed)
Last Visit: 09/22/17 Next visit" 01/29/18 Labs: 09/11/17 Creat. 1.28 GFR 43 Alk phos. 139 Previous Creat 0.9 Alk phos. 144 Hgb 9.3 Previous  10.4  PLQ Eye Exam: 10/28/16 WNL   Okay to refill PLQ and MTX?

## 2017-10-27 NOTE — Progress Notes (Signed)
Office Visit Note  Patient: Rose Lutz             Date of Birth: Jan 02, 1951           MRN: [id number]             PCP: Jake Samples, PA-C Referring: Jake Samples, [place]* Visit Date: 10/28/2017 Occupation: @GUAROCC @  Subjective:  Discuss Arava   History of Present Illness: Rose Lutz is a 67 y.o. female with history of seropositive rheumatoid arthritis and osteoarthritis.  She restarted on MTX at the beginning of September 2019. She continues to take PLQ 200 mg 1 tablet by mouth BID M-F.  She completed the prednisone taper. She continues to have pain and swelling in both hands. She has difficulty gripping and writing. She reports she is having pain in the left knee joint. She does not feel that MTX is effective yet.   Activities of Daily Living:  Patient reports morning stiffness for 5 minutes.   Patient Denies nocturnal pain.  Difficulty dressing/grooming: Denies Difficulty climbing stairs: Reports Difficulty getting out of chair: Reports Difficulty using hands for taps, buttons, cutlery, and/or writing: Reports  Review of Systems  Constitutional: Negative for fatigue.  HENT: Positive for mouth dryness. Negative for mouth sores and nose dryness.   Eyes: Negative for pain, visual disturbance and dryness.  Respiratory: Negative for cough, hemoptysis, shortness of breath and difficulty breathing.   Cardiovascular: Negative for chest pain, palpitations, hypertension and swelling in legs/feet.  Gastrointestinal: Positive for constipation. Negative for blood in stool and diarrhea.  Endocrine: Negative for increased urination.  Genitourinary: Negative for painful urination.  Musculoskeletal: Positive for arthralgias, joint pain, joint swelling and morning stiffness. Negative for myalgias, muscle weakness, muscle tenderness and myalgias.  Skin: Negative for color change, pallor, rash, hair loss, nodules/bumps, skin tightness, ulcers and sensitivity to sunlight.    Allergic/Immunologic: Negative for susceptible to infections.  Neurological: Negative for dizziness, numbness, headaches and weakness.  Hematological: Negative for swollen glands.  Psychiatric/Behavioral: Positive for depressed mood. Negative for sleep disturbance. The patient is not nervous/anxious.     PMFS History:  Patient Active Problem List   Diagnosis Date Noted  . History of total right knee replacement 09/22/2017  . Primary malignant neoplasm of left upper lobe of lung ([place]) 01/22/2017  . Family history of cancer 01/22/2017  . Mass of left lung 12/19/2016  . Rheumatoid arthritis involving multiple sites with positive rheumatoid factor ([place]) 05/06/2016  . High risk medications (not anticoagulants) long-term use 05/06/2016  . Primary osteoarthritis of both hands 05/06/2016  . Primary osteoarthritis of left knee 05/06/2016  . Pain in left ankle and joints of left foot 05/06/2016  . Other fatigue 05/06/2016  . Dizziness and giddiness 02/05/2015  . HTN (hypertension) 02/05/2015    Past Medical History:  Diagnosis Date  . Asbestos exposure    Spot on lung  . Cancer ([place]) 06/2017   LUNG WAS REMOVED  . Chronic pain    On hydrocodone  . Depression   . Dyspnea    on exertion  . Essential hypertension   . GERD (gastroesophageal reflux disease)   . Pulmonary asbestosis [facility])    Patient states mild  . Rheumatoid arthritis (HCC)    and osteoarthritis    Family History  Problem Relation Age of Onset  . Breast cancer Mother   . Colon cancer Maternal Grandmother   . Pancreatic cancer Son    Past Surgical History:  Procedure Laterality  Date  . ABDOMINAL HYSTERECTOMY    . BILATERAL OOPHORECTOMY    . CESAREAN SECTION     x3  . KNEE ARTHROPLASTY Right   . LUNG CANCER SURGERY Left 07/08/2017  . TONSILLECTOMY    . VIDEO BRONCHOSCOPY WITH ENDOBRONCHIAL NAVIGATION Left 12/31/2016   Procedure: VIDEO BRONCHOSCOPY WITH ENDOBRONCHIAL NAVIGATION, left upper lung lobe;  Surgeon:  Collene Gobble, MD;  Location: [facility];  Service: Thoracic;  Laterality: Left;  . YAG LASER APPLICATION Right 98/11/9145   Procedure: YAG LASER APPLICATION;  Surgeon: Rutherford Guys, MD;  Location: [facility];  Service: Ophthalmology;  Laterality: Right;  . YAG LASER APPLICATION Left 82/95/6213   Procedure: YAG LASER APPLICATION;  Surgeon: Rutherford Guys, MD;  Location: [facility];  Service: Ophthalmology;  Laterality: Left;   Social History   Social History Narrative  . Not on file    Objective: Vital Signs: BP (!) 147/91 (BP Location: Left Arm, Patient Position: Sitting, Cuff Size: Large)   Pulse 86   Resp 14   Ht 5\' 3"  (1.6 m)   Wt 169 lb 9.6 oz (76.9 kg)   BMI 30.04 kg/m    Physical Exam  Constitutional: She is oriented to person, place, and time. She appears well-developed and well-nourished.  HENT:  Head: Normocephalic and atraumatic.  Eyes: Conjunctivae and EOM are normal.  Neck: Normal range of motion.  Cardiovascular: Normal rate, regular rhythm, normal heart sounds and intact distal pulses.  Pulmonary/Chest: Effort normal.  Diminished breath sounds  Abdominal: Soft. Bowel sounds are normal.  Lymphadenopathy:    She has no cervical adenopathy.  Neurological: She is alert and oriented to person, place, and time.  Skin: Skin is warm and dry. Capillary refill takes less than 2 seconds.  Psychiatric: She has a normal mood and affect. Her behavior is normal.  Nursing note and vitals reviewed.    Musculoskeletal Exam: C-spine, thoracic spine, and lumbar spine good ROM.  Left shoulder abduction 90, right shoulder abduction 120 degrees.  Elbow joints good ROM. Right wrist limited ROM.  Ulnar deviation bilaterally.  Left 2nd MCP joint synovitis.  Hip joints good ROM.  Bilateral knee joints are warm. No tenderness or swelling of ankle joints.   CDAI Exam: CDAI Score: 5.6  Patient Global Assessment: 8 (mm); Provider Global Assessment: 8 (mm) Swollen: 3 ; Tender: 1  Joint Exam       Right  Left  MCP 2     Swollen Tender  Knee  Swollen   Swollen      Investigation: No additional findings.  Imaging: No results found.  Recent Labs: Lab Results  Component Value Date   WBC 7.7 09/11/2017   HGB 9.3 (L) 09/11/2017   PLT 264 09/11/2017   NA 140 09/11/2017   K 4.1 09/11/2017   CL 102 09/11/2017   CO2 23 09/11/2017   GLUCOSE 97 09/11/2017   BUN 14 09/11/2017   CREATININE 1.28 (H) 09/11/2017   BILITOT <0.2 09/11/2017   ALKPHOS 139 (H) 09/11/2017   AST 21 09/11/2017   ALT 15 09/11/2017   PROT 6.7 09/11/2017   ALBUMIN 3.5 (L) 09/11/2017   CALCIUM 9.6 09/11/2017   GFRAA 50 (L) 09/11/2017    Speciality Comments: No specialty comments available.  Procedures:  No procedures performed Allergies: Quinine derivatives; Sulfa antibiotics; and Percocet [oxycodone-acetaminophen]   Assessment / Plan:     Visit Diagnoses: Rheumatoid arthritis involving multiple sites with positive rheumatoid factor (HCC) - +RF, +CCP, +ANA, erosive, contractures: She has  synovitis of the left second MCP joint.  Warmth of bilateral knee joints.  She continues to have pain in multiple joints.  She recently completed a prednisone taper.  She restarted on methotrexate at the beginning of September but has not noticed any benefit yet.  She has been taking Plaquenil 200 mg 1 tablet twice daily Monday through Friday.  Due to elevated creatinine and low GFR she will discontinue methotrexate.  We discussed the indications, contraindications, potential side effects of starting [place].  Consent was obtained today.  All questions were addressed.  She will start on 10 mg by mouth daily and if labs are stable in 2 weeks she will increase to 20 mg by mouth daily.  A prescription will be sent to the pharmacy today.  She was advised to notify us if she develops any side effects. She will follow up in 3 months.   Medication counseling:  TB Gold: Ordered   Pregnancy status:  Post-menopausal   Patient was  counseled on the purpose, proper use, and adverse effects of leflunomide including risk of infection, nausea/diarrhea/weight loss, increase in blood pressure, rash, hair loss, tingling in the hands and feet, and signs and symptoms of interstitial lung disease.  Discussed the importance of frequent monitoring of liver function and blood counts, and patient was provided with instructions for standing labs.  Discussed importance of birth control while on leflunomide due to risk of congenital abnormalities, and patient confirms she is postmenopausal.  Provided patient with educational materials on leflunomide and answered all questions.  Patient consented to [place] use, and consent will be uploaded into the media tab.    High risk medication use - PLQ, adding Arava to current treatment regimen.  Discontinue MTX due to creatinine and GFR. eye exam? - standing orders for CMP and CBC were ordered.  Future order for TB gold placed today. Plan: HIV Antibody (routine testing w rflx)  Finger clubbing  Primary osteoarthritis of left knee: Warmth on exam.  Discomfort with ROM.    History of total right knee replacement - Warmth noted. She's followed by Dr. Lorin Mercy.   Decreased GFR: She was advised to discontinue MTX.   Other medical conditions are listed as follows:   History of gastroesophageal reflux (GERD)  History of hypertension  History of anemia   Orders: Orders Placed This Encounter  Procedures  . HIV Antibody (routine testing w rflx)   No orders of the defined types were placed in this encounter.   Face-to-face time spent with patient was 30 minutes. Greater than 50% of time was spent in counseling and coordination of care.  Follow-Up Instructions: Return in about 3 months (around 01/28/2018).   Ofilia Neas, PA-C  Note - This record has been created using Dragon software.  Chart creation errors have been sought, but may not always  have been located. Such creation errors do not reflect  on  the standard of medical care.

## 2017-10-27 NOTE — Telephone Encounter (Signed)
Patient has been scheduled for 10/28/17 and prescription for PLQ sent to the pharmacy.

## 2017-10-27 NOTE — Progress Notes (Signed)
Pharmacy Note Subjective: Patient presents today to the [facility] to see Dr. Estanislado Pandy.   Patient seen by the pharmacist for counseling on Arava.  Past medical history includes lung cancer with removal in June 2019.  Immune suppressing medications were held during chemotherapy.  Oncologist cleared her to restart Plaquenil and methotrexate which was restarted in August.  Objective: TB Test: negative 05/10/15 Hepatitis panel: negative 04/26/13 HIV: pending SPEP: within normal limits 04/26/13 Immunoglobulins: within normal limits 04/26/13  Chest x-ray: 12/31/16  CBC    Component Value Date/Time   WBC 7.7 09/11/2017 1326   WBC 10.3 01/22/2017 1244   WBC 8.3 12/26/2016 1000   RBC 3.13 (L) 09/11/2017 1326   RBC 3.72 01/22/2017 1244   RBC 3.63 (L) 12/26/2016 1000   HGB 9.3 (L) 09/11/2017 1326   HGB 10.4 (L) 01/22/2017 1244   HCT 30.5 (L) 09/11/2017 1326   HCT 33.6 (L) 01/22/2017 1244   PLT 264 09/11/2017 1326   MCV 97 09/11/2017 1326   MCV 90.3 01/22/2017 1244   MCH 29.7 09/11/2017 1326   MCH 28.0 01/22/2017 1244   MCH 28.1 12/26/2016 1000   MCHC 30.5 (L) 09/11/2017 1326   MCHC 31.0 (L) 01/22/2017 1244   MCHC 31.4 12/26/2016 1000   RDW 15.5 (H) 09/11/2017 1326   RDW 17.0 (H) 01/22/2017 1244   LYMPHSABS 1.7 09/11/2017 1326   LYMPHSABS 1.5 01/22/2017 1244   MONOABS 0.6 01/22/2017 1244   EOSABS 0.1 09/11/2017 1326   BASOSABS 0.0 09/11/2017 1326   BASOSABS 0.0 01/22/2017 1244    Assessment/Plan:  Patient was counseled on the purpose, proper use, and adverse effects of leflunomide including risk of infection, nausea/diarrhea/weight loss, increase in blood pressure, rash, hair loss, tingling in the hands and feet, and signs and symptoms of interstitial lung disease.  Discussed the importance of frequent monitoring of liver function and blood count.   Provided patient with educational materials on leflunomide and answered all questions.  Patient consented to [place] use, and  consent will be uploaded into the media tab.    Mariella Saa, PharmD, [facility] Clinical Pharmacist  10/28/2017 1:17 PM

## 2017-10-27 NOTE — Telephone Encounter (Signed)
You may refill her Plaquenil.  I would prefer to hold methotrexate due to elevation in her creatinine.  Please try to bring her in the office as soon as possible so we can switch her to [place].

## 2017-10-28 ENCOUNTER — Encounter: Payer: Self-pay | Admitting: Physician Assistant

## 2017-10-28 ENCOUNTER — Ambulatory Visit: Payer: Medicare Other | Admitting: Physician Assistant

## 2017-10-28 VITALS — BP 147/91 | HR 86 | Resp 14 | Ht 63.0 in | Wt 169.6 lb

## 2017-10-28 DIAGNOSIS — Z96651 Presence of right artificial knee joint: Secondary | ICD-10-CM | POA: Diagnosis not present

## 2017-10-28 DIAGNOSIS — R683 Clubbing of fingers: Secondary | ICD-10-CM

## 2017-10-28 DIAGNOSIS — M0579 Rheumatoid arthritis with rheumatoid factor of multiple sites without organ or systems involvement: Secondary | ICD-10-CM | POA: Diagnosis not present

## 2017-10-28 DIAGNOSIS — M1712 Unilateral primary osteoarthritis, left knee: Secondary | ICD-10-CM

## 2017-10-28 DIAGNOSIS — Z8679 Personal history of other diseases of the circulatory system: Secondary | ICD-10-CM

## 2017-10-28 DIAGNOSIS — R944 Abnormal results of kidney function studies: Secondary | ICD-10-CM

## 2017-10-28 DIAGNOSIS — Z8719 Personal history of other diseases of the digestive system: Secondary | ICD-10-CM

## 2017-10-28 DIAGNOSIS — Z79899 Other long term (current) drug therapy: Secondary | ICD-10-CM

## 2017-10-28 DIAGNOSIS — Z862 Personal history of diseases of the blood and blood-forming organs and certain disorders involving the immune mechanism: Secondary | ICD-10-CM

## 2017-10-28 MED ORDER — LEFLUNOMIDE 10 MG PO TABS: ORAL_TABLET | ORAL | 0 refills | Status: DC

## 2017-10-28 NOTE — Patient Instructions (Signed)
Standing Labs We placed an order today for your standing lab work.    Please come back and get your standing labs in 2 weeks x2, then 2 months, then every 3 months  We have open lab Monday through Friday from 8:30-11:30 AM and 1:30-4:00 PM  at the office of Dr. Bo Merino.   You may experience shorter wait times on Monday and Friday afternoons. The office is located at [address] [place], [place], [place] [address] No appointment is necessary.   Labs are drawn by [facility].  You may receive a bill from [facility] for your lab work. If you have any questions regarding directions or hours of operation,  please call [phone].   Just as a reminder please drink plenty of water prior to coming for your lab work. Thanks!    Leflunomide tablets What is this medicine? LEFLUNOMIDE (le FLOO na mide) is for rheumatoid arthritis. This medicine may be used for other purposes; ask your health care provider or pharmacist if you have questions. COMMON BRAND NAME(S): Arava What should I tell my health care provider before I take this medicine? They need to know if you have any of these conditions: -alcoholism -bone marrow problems -fever or infection -immune system problems -kidney disease -liver disease -an unusual or allergic reaction to leflunomide, teriflunomide, other medicines, lactose, foods, dyes, or preservatives -pregnant or trying to get pregnant -breast-feeding How should I use this medicine? Take this medicine by mouth with a full glass of water. Follow the directions on the prescription label. Take your medicine at regular intervals. Do not take your medicine more often than directed. Do not stop taking except on your doctor's advice. Talk to your pediatrician regarding the use of this medicine in children. Special care may be needed. Overdosage: If you think you have taken too much of this medicine contact a poison control center or emergency room at once. NOTE: This  medicine is only for you. Do not share this medicine with others. What if I miss a dose? If you miss a dose, take it as soon as you can. If it is almost time for your next dose, take only that dose. Do not take double or extra doses. What may interact with this medicine? Do not take this medicine with any of the following medications: -teriflunomide This medicine may also interact with the following medications: -charcoal -cholestyramine -methotrexate -NSAIDs, medicines for pain and inflammation, like ibuprofen or naproxen -phenytoin -rifampin -tolbutamide -vaccines -warfarin This list may not describe all possible interactions. Give your health care provider a list of all the medicines, herbs, non-prescription drugs, or dietary supplements you use. Also tell them if you smoke, drink alcohol, or use illegal drugs. Some items may interact with your medicine. What should I watch for while using this medicine? Visit your doctor or health care professional for regular checks on your progress. You will need frequent blood checks while you are receiving the medicine. If you get a cold or other infection while receiving this medicine, call your doctor or health care professional. Do not treat yourself. The medicine may increase your risk of getting an infection. If you are a woman who has the potential to become pregnant, discuss birth control options with your doctor or health care professional. Dennis Bast must not be pregnant, and you must be using a reliable form of birth control. The medicine may harm an unborn baby. Immediately call your doctor if you think you might be pregnant. Alcoholic drinks may increase possible  damage to your liver. Do not drink alcohol while taking this medicine. What side effects may I notice from receiving this medicine? Side effects that you should report to your doctor or health care professional as soon as possible: -allergic reactions like skin rash, itching or hives,  swelling of the face, lips, or tongue -cough -difficulty breathing or shortness of breath -fever, chills or any other sign of infection -redness, blistering, peeling or loosening of the skin, including inside the mouth -unusual bleeding or bruising -unusually weak or tired -vomiting -yellowing of eyes or skin Side effects that usually do not require medical attention (report to your doctor or health care professional if they continue or are bothersome): -diarrhea -hair loss -headache -nausea This list may not describe all possible side effects. Call your doctor for medical advice about side effects. You may report side effects to FDA at 1-800-FDA-1088. Where should I keep my medicine? Keep out of the reach of children. Store at room temperature between 15 and 30 degrees C (59 and 86 degrees F). Protect from moisture and light. Throw away any unused medicine after the expiration date. NOTE: This sheet is a summary. It may not cover all possible information. If you have questions about this medicine, talk to your doctor, pharmacist, or health care provider.  2018 Elsevier/Gold Standard (2013-01-04 10:53:11)

## 2017-11-09 ENCOUNTER — Other Ambulatory Visit: Payer: Self-pay | Admitting: Rheumatology

## 2017-11-09 ENCOUNTER — Telehealth: Payer: Self-pay | Admitting: Rheumatology

## 2017-11-09 DIAGNOSIS — Z79899 Other long term (current) drug therapy: Secondary | ICD-10-CM | POA: Diagnosis not present

## 2017-11-09 NOTE — Telephone Encounter (Signed)
Patient left a voicemail stating she is leaving for [place] and needs her medication.  Patient states she had her labwork done today.  Patient requested a return call.

## 2017-11-10 LAB — CBC WITH DIFFERENTIAL/PLATELET
BASOS: 0 %
Basophils Absolute: 0 10*3/uL (ref 0.0–0.2)
EOS (ABSOLUTE): 0.1 10*3/uL (ref 0.0–0.4)
EOS: 1 %
HEMATOCRIT: 27.3 % — AB (ref 34.0–46.6)
Hemoglobin: 9.2 g/dL — ABNORMAL LOW (ref 11.1–15.9)
IMMATURE GRANS (ABS): 0 10*3/uL (ref 0.0–0.1)
IMMATURE GRANULOCYTES: 0 %
LYMPHS: 32 %
Lymphocytes Absolute: 2.1 10*3/uL (ref 0.7–3.1)
MCH: 30.6 pg (ref 26.6–33.0)
MCHC: 33.7 g/dL (ref 31.5–35.7)
MCV: 91 fL (ref 79–97)
MONOS ABS: 0.9 10*3/uL (ref 0.1–0.9)
Monocytes: 13 %
NEUTROS PCT: 54 %
Neutrophils Absolute: 3.6 10*3/uL (ref 1.4–7.0)
PLATELETS: 248 10*3/uL (ref 150–450)
RBC: 3.01 x10E6/uL — AB (ref 3.77–5.28)
RDW: 14.7 % (ref 12.3–15.4)
WBC: 6.7 10*3/uL (ref 3.4–10.8)

## 2017-11-10 LAB — CMP14+EGFR
ALT: 6 IU/L (ref 0–32)
AST: 14 IU/L (ref 0–40)
Albumin/Globulin Ratio: 1.5 (ref 1.2–2.2)
Albumin: 3.8 g/dL (ref 3.6–4.8)
Alkaline Phosphatase: 101 IU/L (ref 39–117)
BUN/Creatinine Ratio: 16 (ref 12–28)
BUN: 18 mg/dL (ref 8–27)
Bilirubin Total: 0.2 mg/dL (ref 0.0–1.2)
CO2: 24 mmol/L (ref 20–29)
CREATININE: 1.16 mg/dL — AB (ref 0.57–1.00)
Calcium: 9.3 mg/dL (ref 8.7–10.3)
Chloride: 103 mmol/L (ref 96–106)
GFR calc Af Amer: 56 mL/min/{1.73_m2} — ABNORMAL LOW (ref >59)
GFR calc non Af Amer: 48 mL/min/{1.73_m2} — ABNORMAL LOW (ref >59)
GLUCOSE: 87 mg/dL (ref 65–99)
Globulin, Total: 2.6 g/dL (ref 1.5–4.5)
Potassium: 4 mmol/L (ref 3.5–5.2)
SODIUM: 142 mmol/L (ref 134–144)
Total Protein: 6.4 g/dL (ref 6.0–8.5)

## 2017-11-10 NOTE — Telephone Encounter (Signed)
Returned patient's call and patient states she started taking arava 10mg , 2 weeks ago. Patient had labs done yesterday and would like to know if she should increase to 2 tabs (20mg ) daily based on lab results? Please advise.

## 2017-11-10 NOTE — Telephone Encounter (Signed)
Advised patient of the lab results and recommendations to increase dose. Patient verbalized understanding and will have labs rechecked in 2 weeks and then every 2 months.

## 2017-11-10 NOTE — Telephone Encounter (Signed)
Labs reveal anemia which is a stable.  LFTs are still elevated but is stable.  Okay to increase Arava to 20 mg p.o. daily.  She should have repeat labs in 2 weeks after increasing the dose of Arava followed by a month and then every 2 months.

## 2017-11-12 DIAGNOSIS — C3412 Malignant neoplasm of upper lobe, left bronchus or lung: Secondary | ICD-10-CM | POA: Diagnosis not present

## 2017-11-12 DIAGNOSIS — C3490 Malignant neoplasm of unspecified part of unspecified bronchus or lung: Secondary | ICD-10-CM | POA: Diagnosis not present

## 2017-11-13 ENCOUNTER — Telehealth: Payer: Self-pay | Admitting: Rheumatology

## 2017-11-13 NOTE — Telephone Encounter (Signed)
Rose Lutz, nurse practitioner left a voicemail stating Rose Lutz is a mutual patient who is being transitioned from Methotrexate.  She states the patient has new findings that are suggestive of pulmonary hypertension on CT scan and mildly short of breath.  A fax report of her CT scan will be faxed to [facility] and if you have additional questions, please call my cell [phone]

## 2017-11-15 DIAGNOSIS — M069 Rheumatoid arthritis, unspecified: Secondary | ICD-10-CM | POA: Diagnosis not present

## 2017-11-15 DIAGNOSIS — C3490 Malignant neoplasm of unspecified part of unspecified bronchus or lung: Secondary | ICD-10-CM | POA: Diagnosis not present

## 2017-11-15 DIAGNOSIS — G8929 Other chronic pain: Secondary | ICD-10-CM | POA: Diagnosis not present

## 2017-11-15 DIAGNOSIS — M1990 Unspecified osteoarthritis, unspecified site: Secondary | ICD-10-CM | POA: Diagnosis not present

## 2017-11-18 NOTE — Telephone Encounter (Signed)
Left message to advise Georgeanne we have not received the CT Scan results. Left the fax number to have her fax results. Found results in care every where:  CT of the chest without contrast dated 11/12/2017  CLINICAL INDICATION: Restaging of non-small cell lung cancer  TECHNIQUE: CT of the chest performed without the administration of intravenous contrast. Multiplanar reconstructions  COMPARISON: Prior CT from 06/22/2017  FINDINGS: Status post left upper lobectomy and left superior segmentectomy. No suspicious lung nodules or lung masses are seen. Wedge resection suture line is unremarkable. Small left-sided pleural effusion, likely postsurgical in origin.  Shift of mediastinum to the left.  No hilar, mediastinal or axillary lymphadenopathy by CT size criteria. Left vascular/para-aortic lymph nodes, measuring 4 to 5 mm in short axis fatty hila, most likely reactive, were present on the prior scan.  Pulmonary artery measures 3.6 cm, dilated. Small left-sided pericardial effusion. Normal cardiac size. Evidence of anemia  Hypodensity within the left lobe of liver, too small to characterize, most likely a cyst.  Mild fatty replacement of the pancreas.  No acute osseous abnormalities. Deformities of the anterior ends of the second and third ribs, may be postsurgical. Stable sclerotic areas in the posterior ends of the left third, fourth and fifth ribs.  IMPRESSION 1. Left lobectomy and superior segmentectomy. No suspicious lung nodules or lung masses seen. 2. Small left-sided pericardial effusion. This is likely nonspecific but attention on follow-up scan. 3. Dilated main pulmonary artery, please correlate with clinical concern for pulmonary hypertension.  Images and interpretation personally reviewed by: Lucile Crater  Other Result Information  Interface, [facility] Rad Results Incoming - 11/12/2017  4:27 PM EDT CT of the chest without contrast dated 11/12/2017  CLINICAL INDICATION: Restaging  of non-small cell lung cancer  TECHNIQUE: CT of the chest performed without the administration of intravenous contrast. Multiplanar reconstructions  COMPARISON: Prior CT from 06/22/2017  FINDINGS: Status post left upper lobectomy and left superior segmentectomy. No suspicious lung nodules or lung masses are seen. Wedge resection suture line is unremarkable. Small left-sided pleural effusion, likely postsurgical in origin.  Shift of mediastinum to the left.  No hilar, mediastinal or axillary lymphadenopathy by CT size criteria. Left vascular/para-aortic lymph nodes, measuring 4 to 5 mm in short axis fatty hila, most likely reactive, were present on the prior scan.  Pulmonary artery measures 3.6 cm, dilated. Small left-sided pericardial effusion. Normal cardiac size. Evidence of anemia  Hypodensity within the left lobe of liver, too small to characterize, most likely a cyst.  Mild fatty replacement of the pancreas.  No acute osseous abnormalities. Deformities of the anterior ends of the second and third ribs, may be postsurgical. Stable sclerotic areas in the posterior ends of the left third, fourth and fifth ribs.  IMPRESSION 1. Left lobectomy and superior segmentectomy. No suspicious lung nodules or lung masses seen. 2. Small left-sided pericardial effusion. This is likely nonspecific but attention on follow-up scan. 3. Dilated main pulmonary artery, please correlate with clinical concern for pulmonary hypertension.   We have have patient on PLQ and [place]. She has been seeing Belva Bertin for lung Cancer.

## 2017-11-23 ENCOUNTER — Other Ambulatory Visit: Payer: Self-pay | Admitting: Rheumatology

## 2017-11-23 NOTE — Telephone Encounter (Signed)
Last Visit: 10/28/17 Next Visit: 01/29/18 Labs: 11/09/17 Creat 1.16 Previously 1.28 GFR 48 Previously 43 Hgb 9.2 previously 9.3   Okay to refill Arava?

## 2017-11-23 NOTE — Telephone Encounter (Signed)
Ok to refill 

## 2017-11-23 NOTE — Telephone Encounter (Signed)
Patient called requesting prescription refill of Leflunomide to be sent to [facility] on [address] in [place], [place].  Patient states she is out of town and needs the prescription filled at this location.

## 2017-11-26 ENCOUNTER — Other Ambulatory Visit: Payer: Self-pay | Admitting: Rheumatology

## 2017-11-26 DIAGNOSIS — Z79899 Other long term (current) drug therapy: Secondary | ICD-10-CM | POA: Diagnosis not present

## 2017-11-26 MED ORDER — LEFLUNOMIDE 10 MG PO TABS: 20.0000 mg | ORAL_TABLET | Freq: Every day | ORAL | 2 refills | Status: DC

## 2017-11-27 LAB — CMP14+EGFR
ALBUMIN: 3.6 g/dL (ref 3.6–4.8)
ALK PHOS: 100 IU/L (ref 39–117)
ALT: 12 IU/L (ref 0–32)
AST: 20 IU/L (ref 0–40)
Albumin/Globulin Ratio: 1.3 (ref 1.2–2.2)
BILIRUBIN TOTAL: 0.2 mg/dL (ref 0.0–1.2)
BUN / CREAT RATIO: 14 (ref 12–28)
BUN: 15 mg/dL (ref 8–27)
CALCIUM: 9.4 mg/dL (ref 8.7–10.3)
CHLORIDE: 103 mmol/L (ref 96–106)
CO2: 29 mmol/L (ref 20–29)
Creatinine, Ser: 1.07 mg/dL — ABNORMAL HIGH (ref 0.57–1.00)
GFR calc Af Amer: 62 mL/min/{1.73_m2} (ref >59)
GFR calc non Af Amer: 53 mL/min/{1.73_m2} — ABNORMAL LOW (ref >59)
GLOBULIN, TOTAL: 2.7 g/dL (ref 1.5–4.5)
Glucose: 85 mg/dL (ref 65–99)
Potassium: 4.7 mmol/L (ref 3.5–5.2)
SODIUM: 144 mmol/L (ref 134–144)
Total Protein: 6.3 g/dL (ref 6.0–8.5)

## 2017-11-27 LAB — CBC WITH DIFFERENTIAL/PLATELET
BASOS ABS: 0 10*3/uL (ref 0.0–0.2)
Basos: 1 %
EOS (ABSOLUTE): 0.1 10*3/uL (ref 0.0–0.4)
Eos: 2 %
HEMATOCRIT: 28.5 % — AB (ref 34.0–46.6)
Hemoglobin: 9.1 g/dL — ABNORMAL LOW (ref 11.1–15.9)
Immature Grans (Abs): 0 10*3/uL (ref 0.0–0.1)
Immature Granulocytes: 0 %
LYMPHS ABS: 1.8 10*3/uL (ref 0.7–3.1)
Lymphs: 23 %
MCH: 29.5 pg (ref 26.6–33.0)
MCHC: 31.9 g/dL (ref 31.5–35.7)
MCV: 93 fL (ref 79–97)
Monocytes Absolute: 0.8 10*3/uL (ref 0.1–0.9)
Monocytes: 10 %
Neutrophils Absolute: 5.1 10*3/uL (ref 1.4–7.0)
Neutrophils: 64 %
Platelets: 255 10*3/uL (ref 150–450)
RBC: 3.08 x10E6/uL — AB (ref 3.77–5.28)
RDW: 14.6 % (ref 12.3–15.4)
WBC: 7.9 10*3/uL (ref 3.4–10.8)

## 2017-11-27 NOTE — Progress Notes (Signed)
Labs are stable.  Anemia persist.  Please forward labs to her PCP.

## 2017-12-16 DIAGNOSIS — G8929 Other chronic pain: Secondary | ICD-10-CM | POA: Diagnosis not present

## 2017-12-16 DIAGNOSIS — M069 Rheumatoid arthritis, unspecified: Secondary | ICD-10-CM | POA: Diagnosis not present

## 2017-12-16 DIAGNOSIS — C3490 Malignant neoplasm of unspecified part of unspecified bronchus or lung: Secondary | ICD-10-CM | POA: Diagnosis not present

## 2017-12-16 DIAGNOSIS — M1990 Unspecified osteoarthritis, unspecified site: Secondary | ICD-10-CM | POA: Diagnosis not present

## 2018-01-08 ENCOUNTER — Telehealth: Payer: Self-pay | Admitting: Rheumatology

## 2018-01-08 NOTE — Telephone Encounter (Signed)
Patient advised that it is to early to refill her PLQ prescription.

## 2018-01-08 NOTE — Telephone Encounter (Signed)
Patient called requesting prescription refill of Plaquenil to be sent to [facility] on [address] in [place], [place].

## 2018-01-15 DIAGNOSIS — M069 Rheumatoid arthritis, unspecified: Secondary | ICD-10-CM | POA: Diagnosis not present

## 2018-01-15 DIAGNOSIS — M1990 Unspecified osteoarthritis, unspecified site: Secondary | ICD-10-CM | POA: Diagnosis not present

## 2018-01-15 DIAGNOSIS — G8929 Other chronic pain: Secondary | ICD-10-CM | POA: Diagnosis not present

## 2018-01-15 DIAGNOSIS — C3490 Malignant neoplasm of unspecified part of unspecified bronchus or lung: Secondary | ICD-10-CM | POA: Diagnosis not present

## 2018-01-22 NOTE — Progress Notes (Deleted)
Office Visit Note  Patient: Rose Lutz             Date of Birth: Aug 04, 1950           MRN: [id number]             PCP: Jake Samples, PA-C Referring: Jake Samples, [place]* Visit Date: 02/05/2018 Occupation: @GUAROCC @  Subjective:  No chief complaint on file.   History of Present Illness: Rose Lutz is a 68 y.o. female ***   Activities of Daily Living:  Patient reports morning stiffness for *** {minute/hour:19697}.   Patient {ACTIONS;DENIES/REPORTS:21021675::"Denies"} nocturnal pain.  Difficulty dressing/grooming: {ACTIONS;DENIES/REPORTS:21021675::"Denies"} Difficulty climbing stairs: {ACTIONS;DENIES/REPORTS:21021675::"Denies"} Difficulty getting out of chair: {ACTIONS;DENIES/REPORTS:21021675::"Denies"} Difficulty using hands for taps, buttons, cutlery, and/or writing: {ACTIONS;DENIES/REPORTS:21021675::"Denies"}  No Rheumatology ROS completed.   PMFS History:  Patient Active Problem List   Diagnosis Date Noted  . History of total right knee replacement 09/22/2017  . Primary malignant neoplasm of left upper lobe of lung ([place]) 01/22/2017  . Family history of cancer 01/22/2017  . Mass of left lung 12/19/2016  . Rheumatoid arthritis involving multiple sites with positive rheumatoid factor ([place]) 05/06/2016  . High risk medications (not anticoagulants) long-term use 05/06/2016  . Primary osteoarthritis of both hands 05/06/2016  . Primary osteoarthritis of left knee 05/06/2016  . Pain in left ankle and joints of left foot 05/06/2016  . Other fatigue 05/06/2016  . Dizziness and giddiness 02/05/2015  . HTN (hypertension) 02/05/2015    Past Medical History:  Diagnosis Date  . Asbestos exposure    Spot on lung  . Cancer ([place]) 06/2017   LUNG WAS REMOVED  . Chronic pain    On hydrocodone  . Depression   . Dyspnea    on exertion  . Essential hypertension   . GERD (gastroesophageal reflux disease)   . Pulmonary asbestosis [facility])    Patient states mild    . Rheumatoid arthritis (HCC)    and osteoarthritis    Family History  Problem Relation Age of Onset  . Breast cancer Mother   . Colon cancer Maternal Grandmother   . Pancreatic cancer Son    Past Surgical History:  Procedure Laterality Date  . ABDOMINAL HYSTERECTOMY    . BILATERAL OOPHORECTOMY    . CESAREAN SECTION     x3  . KNEE ARTHROPLASTY Right   . LUNG CANCER SURGERY Left 07/08/2017  . TONSILLECTOMY    . VIDEO BRONCHOSCOPY WITH ENDOBRONCHIAL NAVIGATION Left 12/31/2016   Procedure: VIDEO BRONCHOSCOPY WITH ENDOBRONCHIAL NAVIGATION, left upper lung lobe;  Surgeon: Collene Gobble, MD;  Location: [place];  Service: Thoracic;  Laterality: Left;  . YAG LASER APPLICATION Right 24/40/1027   Procedure: YAG LASER APPLICATION;  Surgeon: Rutherford Guys, MD;  [facility];  Service: Ophthalmology;  Laterality: Right;  . YAG LASER APPLICATION Left 25/36/6440   Procedure: YAG LASER APPLICATION;  Surgeon: Rutherford Guys, MD;  [facility];  Service: Ophthalmology;  Laterality: Left;   Social History   Social History Narrative  . Not on file    Objective: Vital Signs: There were no vitals taken for this visit.   Physical Exam   Musculoskeletal Exam: ***  CDAI Exam: CDAI Score: Not documented Patient Global Assessment: Not documented; Provider Global Assessment: Not documented Swollen: Not documented; Tender: Not documented Joint Exam   Not documented   There is currently no information documented on the homunculus. Go to the Rheumatology activity and complete the homunculus joint exam.  Investigation: No  additional findings.  Imaging: No results found.  Recent Labs: Lab Results  Component Value Date   WBC 7.9 11/26/2017   HGB 9.1 (L) 11/26/2017   PLT 255 11/26/2017   NA 144 11/26/2017   K 4.7 11/26/2017   CL 103 11/26/2017   CO2 29 11/26/2017   GLUCOSE 85 11/26/2017   BUN 15 11/26/2017   CREATININE 1.07 (H) 11/26/2017   BILITOT 0.2 11/26/2017   ALKPHOS 100  11/26/2017   AST 20 11/26/2017   ALT 12 11/26/2017   PROT 6.3 11/26/2017   ALBUMIN 3.6 11/26/2017   CALCIUM 9.4 11/26/2017   GFRAA 62 11/26/2017    Speciality Comments: No specialty comments available.  Procedures:  No procedures performed Allergies: Quinine derivatives; Sulfa antibiotics; and Percocet [oxycodone-acetaminophen]   Assessment / Plan:     Visit Diagnoses: Rheumatoid arthritis involving multiple sites with positive rheumatoid factor (HCC) - +RF, +CCP, +ANA, erosive, contractures  High risk medication use - PLQ 200 mg BID M-F, added Arava 20 mg at last visit, d/c MTX due to elevated creatinine/low GFR  Primary malignant neoplasm of left upper lobe of lung (HCC)  Decreased GFR  Finger clubbing  Primary osteoarthritis of left knee  History of total right knee replacement  History of gastroesophageal reflux (GERD)  History of hypertension  History of anemia   Orders: No orders of the defined types were placed in this encounter.  No orders of the defined types were placed in this encounter.   Face-to-face time spent with patient was *** minutes. Greater than 50% of time was spent in counseling and coordination of care.  Follow-Up Instructions: No follow-ups on file.   Ofilia Neas, PA-C  Note - This record has been created using Dragon software.  Chart creation errors have been sought, but may not always  have been located. Such creation errors do not reflect on  the standard of medical care.

## 2018-01-25 DIAGNOSIS — G629 Polyneuropathy, unspecified: Secondary | ICD-10-CM | POA: Diagnosis not present

## 2018-01-25 DIAGNOSIS — K769 Liver disease, unspecified: Secondary | ICD-10-CM | POA: Diagnosis not present

## 2018-01-25 DIAGNOSIS — C3412 Malignant neoplasm of upper lobe, left bronchus or lung: Secondary | ICD-10-CM | POA: Diagnosis not present

## 2018-01-25 DIAGNOSIS — C3400 Malignant neoplasm of unspecified main bronchus: Secondary | ICD-10-CM | POA: Diagnosis not present

## 2018-01-29 ENCOUNTER — Ambulatory Visit: Payer: Medicare Other | Admitting: Rheumatology

## 2018-01-29 IMAGING — US US THYROID
1 series · 12 of 25 positions shown · non-contrast
Comparison: None.

CLINICAL DATA: Hypermetabolic right thyroid nodule on PET-CT. Lung
lesions.

EXAM:
THYROID ULTRASOUND
TECHNIQUE: Ultrasound examination of the thyroid gland and adjacent soft
tissues was performed.

[Series 1: us thyroid · 0.05mm/px · 63 acquisitions, 12 frames shown]
[im 3/63]
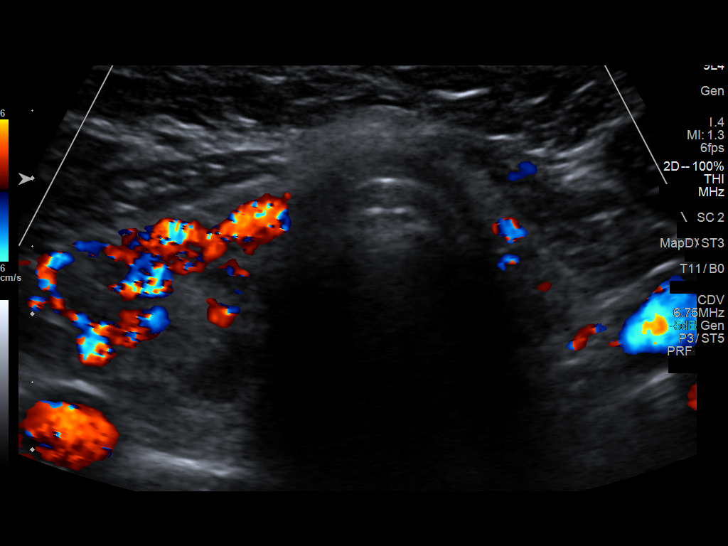
[im 8/63]
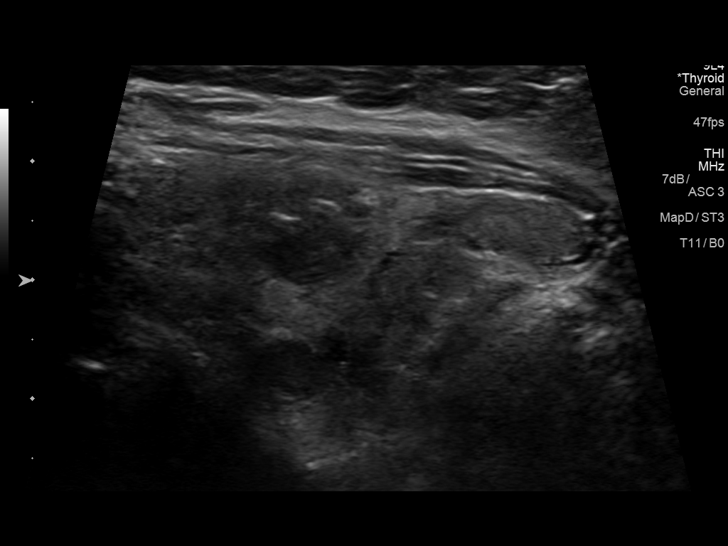
[im 13/63]
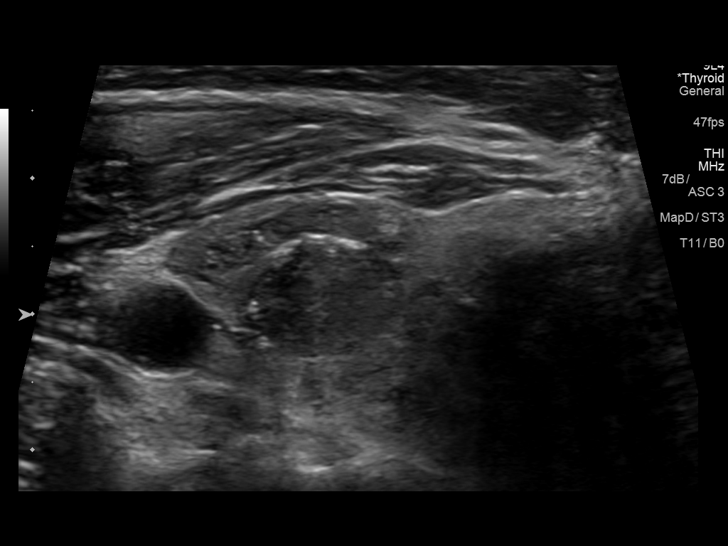
[im 19/63]
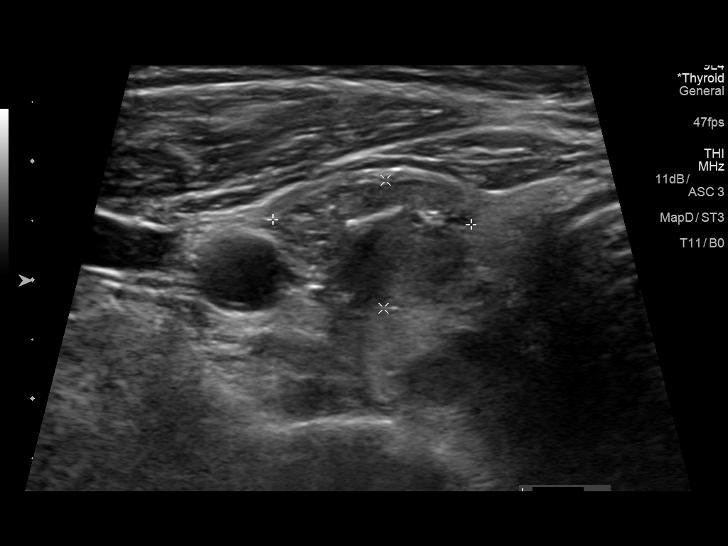
[im 24/63]
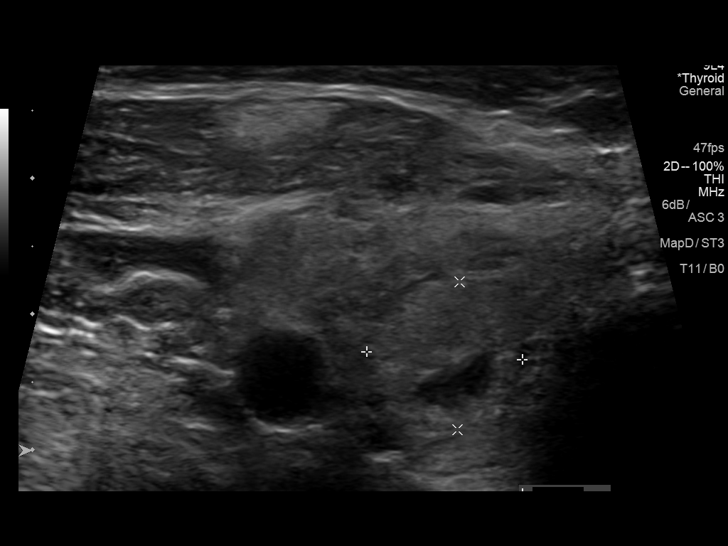
[im 29/63]
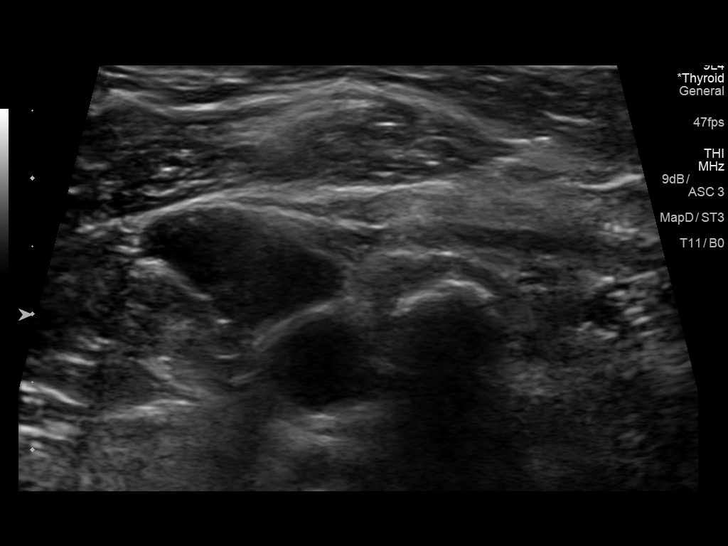
[im 34/63]
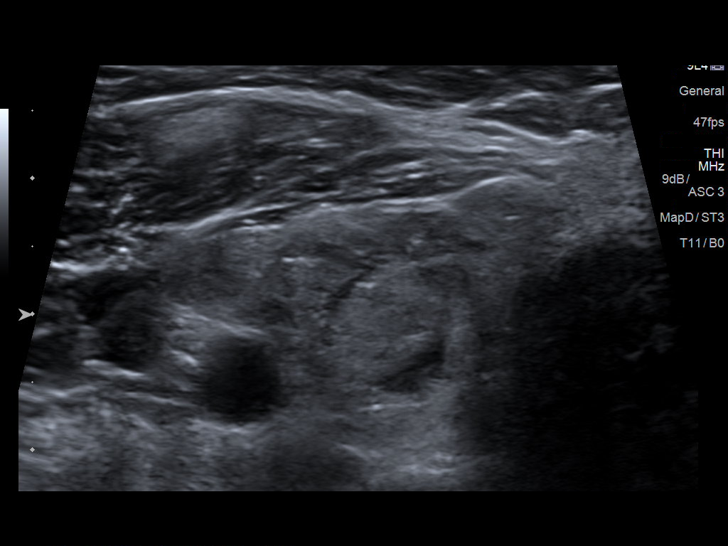
[im 39/63]
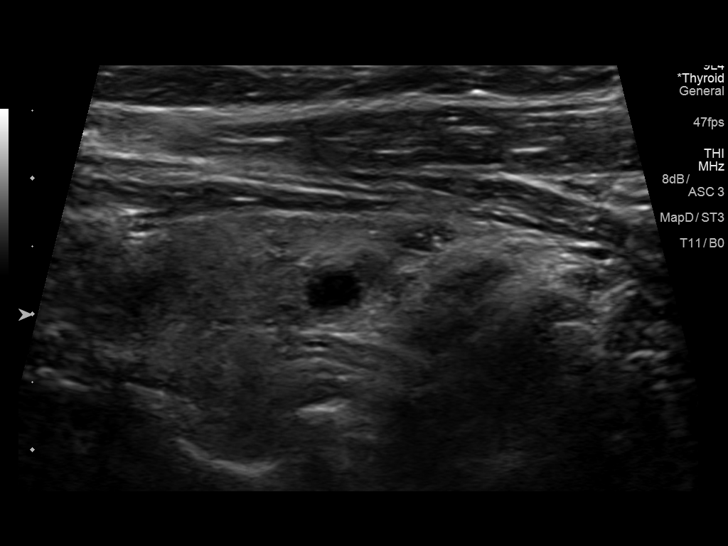
[im 44/63]
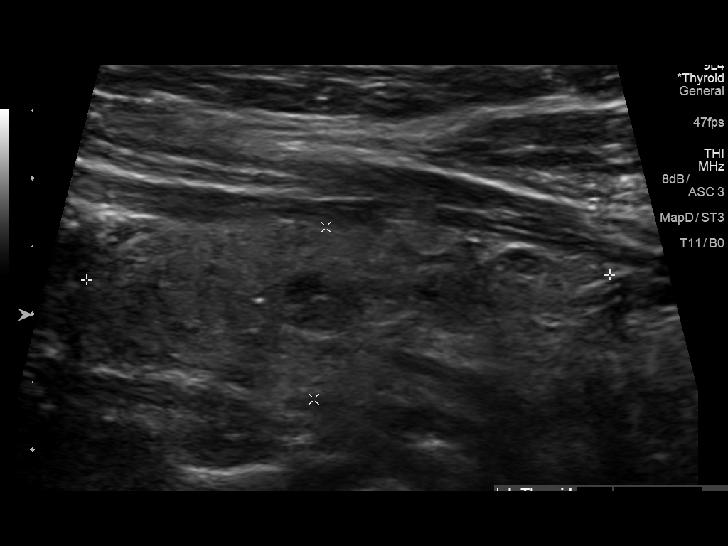
[im 50/63]
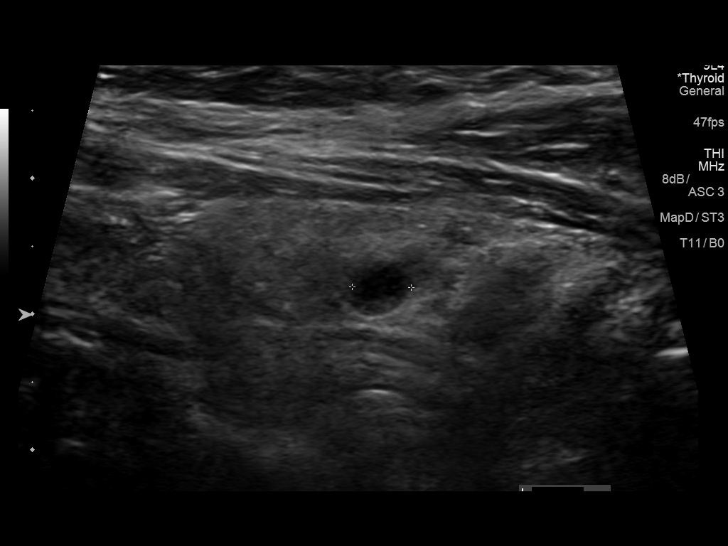
[im 55/63]
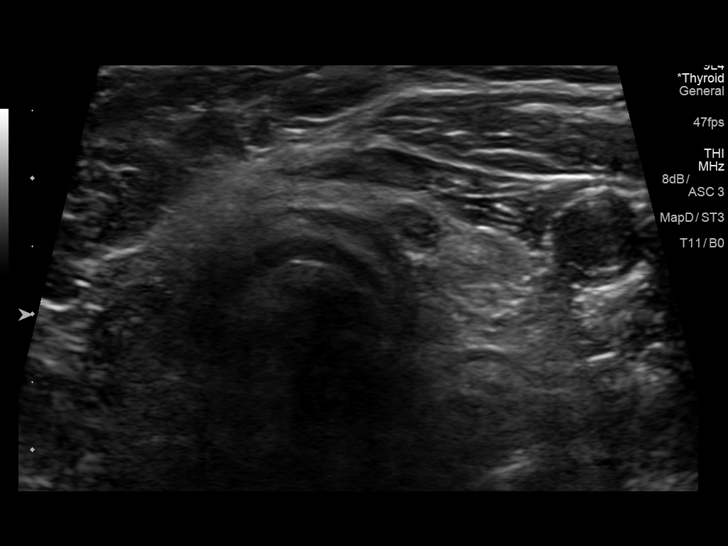
[im 60/63]
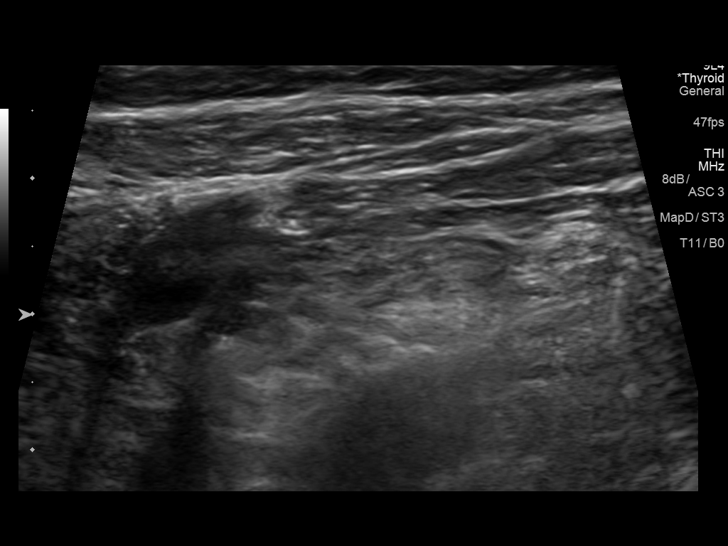

[12 of 25 positions shown; findings below may reference images not displayed]

FINDINGS: Parenchymal Echotexture: Moderately heterogenous

Isthmus: 0.3 cm

Right lobe: 5.3 x 2.1 x 2.4 cm

Left lobe: 3.9 x 1.3 x 1.4 cm

_________________________________________________________

Estimated total number of nodules >/= 1 cm: 3

Number of spongiform nodules >/=  2 cm not described below (TR1): 0

Number of mixed cystic and solid nodules >/= 1.5 cm not described
below (TR2): 0

_________________________________________________________

Nodule # 1:

Location: Right; Mid

Maximum size: 1.9 cm; Other 2 dimensions: 1.1 x 1.7 cm

Composition: solid/almost completely solid (2)

Echogenicity: hypoechoic (2)

Shape: not taller-than-wide (0)

Margins: ill-defined (0)

Echogenic foci: macrocalcifications (1)

ACR TI-RADS total points: 5.

ACR TI-RADS risk category: TR4 (4-6 points).

ACR TI-RADS recommendations:

**Given size (>/= 1.0 cm) and appearance, fine needle aspiration of
this highly suspicious nodule should be considered based on TI-RADS
criteria.

_________________________________________________________

Nodule # 2:

Location: Right; Inferior

Maximum size: 1.5 cm; Other 2 dimensions: 1.1 x 1.2 cm

Composition: solid/almost completely solid (2)

Echogenicity: isoechoic (1)

Shape: not taller-than-wide (0)

Margins: ill-defined (0)

Echogenic foci: none (0)

ACR TI-RADS total points: 3.

ACR TI-RADS risk category: TR3 (3 points).

ACR TI-RADS recommendations:

*Given size (>/= 1.5 - 2.4 cm) and appearance, a follow-up
ultrasound in 1 year should be considered based on TI-RADS criteria.

_________________________________________________________

Nodule # 3:

Location: Right; Inferior

Maximum size: 1.4 cm; Other 2 dimensions: 0.9 x 1.2 cm

Composition: cannot determine (2)

Echogenicity: cannot determine (1)

Shape: not taller-than-wide (0)

Margins: ill-defined (0)

Echogenic foci: macrocalcifications (1)

ACR TI-RADS total points: 4.

ACR TI-RADS risk category: TR4 (4-6 points).

ACR TI-RADS recommendations:

*Given size (>/= 1 - 1.4 cm) and appearance, a follow-up ultrasound
in 1 year should be considered based on TI-RADS criteria.

_________________________________________________________

Hypoechoic structure in the mid left thyroid lobe measuring up to
0.4 cm and may represent a cyst. Hypoechoic nodule in the inferior
left thyroid lobe measures up to 0.6 cm. Left thyroid nodules do not
meet criteria for biopsy.

No significant lymph node enlargement.
IMPRESSION: Thyroid nodules.

Nodule #1 in the right thyroid lobe does meet criteria for biopsy.
This nodule is also in the area of hypermetabolic activity on the
recent PET-CT. Recommend biopsy of this nodule.

Nodule #2 meets criteria for 1 year follow-up based on TI-RADS but
there is hypermetabolic activity in this area on the recent PET-CT.
Therefore, recommend biopsy of this nodule as well.

No significant hypermetabolic activity associated with Nodule #3
based on the PET-CT. Recommend 1 year follow-up of this calcified
nodule.

The above is in keeping with the ACR TI-RADS recommendations - [HOSPITAL] 7356;[DATE].

## 2018-02-02 ENCOUNTER — Telehealth: Payer: Self-pay | Admitting: Rheumatology

## 2018-02-02 MED ORDER — HYDROXYCHLOROQUINE SULFATE 200 MG PO TABS: 400.0000 mg | ORAL_TABLET | Freq: Every evening | ORAL | 0 refills | Status: DC

## 2018-02-02 NOTE — Telephone Encounter (Signed)
ok 

## 2018-02-02 NOTE — Telephone Encounter (Signed)
Last Visit: 09/22/17 Next visit: had to reschedule due to medical issue with spot on liver on CT Scan. Will call back to reschedule.  Labs: 11/26/17 stable PLQ Eye Exam: 10/28/16 WNL   Okay to refill PLQ?

## 2018-02-02 NOTE — Telephone Encounter (Signed)
Patient canceled appt for Friday, but would like a call back concerning this. Patient has had another health issue come up. Please call to discuss. Patient requesting a refill on Plaquenil sent to [facility] in [place], [place]. # [phone]

## 2018-02-05 ENCOUNTER — Ambulatory Visit: Payer: Medicare Other | Admitting: Physician Assistant

## 2018-02-08 ENCOUNTER — Telehealth: Payer: Self-pay | Admitting: Rheumatology

## 2018-02-08 NOTE — Telephone Encounter (Signed)
Patient wanted to know if we sent in a 90 day supply. Patient advised we did and she states she only received a 30 day supply. Patient will contact the pharmacy.

## 2018-02-08 NOTE — Telephone Encounter (Signed)
Patient called requesting a return call regarding her prescription of Plaquenil.

## 2018-02-11 ENCOUNTER — Other Ambulatory Visit: Payer: Self-pay | Admitting: Rheumatology

## 2018-02-11 DIAGNOSIS — Z79899 Other long term (current) drug therapy: Secondary | ICD-10-CM | POA: Diagnosis not present

## 2018-02-11 DIAGNOSIS — I10 Essential (primary) hypertension: Secondary | ICD-10-CM | POA: Diagnosis not present

## 2018-02-11 DIAGNOSIS — Z0001 Encounter for general adult medical examination with abnormal findings: Secondary | ICD-10-CM | POA: Diagnosis not present

## 2018-02-11 DIAGNOSIS — M069 Rheumatoid arthritis, unspecified: Secondary | ICD-10-CM | POA: Diagnosis not present

## 2018-02-11 DIAGNOSIS — R0602 Shortness of breath: Secondary | ICD-10-CM | POA: Diagnosis not present

## 2018-02-11 DIAGNOSIS — Z1389 Encounter for screening for other disorder: Secondary | ICD-10-CM | POA: Diagnosis not present

## 2018-02-11 DIAGNOSIS — G894 Chronic pain syndrome: Secondary | ICD-10-CM | POA: Diagnosis not present

## 2018-02-12 LAB — CMP14+EGFR
ALBUMIN: 3.6 g/dL — AB (ref 3.8–4.8)
ALK PHOS: 107 IU/L (ref 39–117)
ALT: 15 IU/L (ref 0–32)
AST: 20 IU/L (ref 0–40)
Albumin/Globulin Ratio: 1.2 (ref 1.2–2.2)
BUN / CREAT RATIO: 13 (ref 12–28)
BUN: 16 mg/dL (ref 8–27)
Bilirubin Total: 0.2 mg/dL (ref 0.0–1.2)
CHLORIDE: 105 mmol/L (ref 96–106)
CO2: 23 mmol/L (ref 20–29)
Calcium: 9.2 mg/dL (ref 8.7–10.3)
Creatinine, Ser: 1.21 mg/dL — ABNORMAL HIGH (ref 0.57–1.00)
GFR calc Af Amer: 53 mL/min/{1.73_m2} — ABNORMAL LOW (ref >59)
GFR calc non Af Amer: 46 mL/min/{1.73_m2} — ABNORMAL LOW (ref >59)
GLUCOSE: 88 mg/dL (ref 65–99)
Globulin, Total: 3 g/dL (ref 1.5–4.5)
Potassium: 4 mmol/L (ref 3.5–5.2)
Sodium: 143 mmol/L (ref 134–144)
Total Protein: 6.6 g/dL (ref 6.0–8.5)

## 2018-02-12 LAB — CBC WITH DIFFERENTIAL/PLATELET
BASOS ABS: 0.1 10*3/uL (ref 0.0–0.2)
Basos: 1 %
EOS (ABSOLUTE): 0.4 10*3/uL (ref 0.0–0.4)
Eos: 6 %
HEMOGLOBIN: 9.7 g/dL — AB (ref 11.1–15.9)
Hematocrit: 29.4 % — ABNORMAL LOW (ref 34.0–46.6)
Immature Grans (Abs): 0 10*3/uL (ref 0.0–0.1)
Immature Granulocytes: 0 %
LYMPHS ABS: 1.5 10*3/uL (ref 0.7–3.1)
Lymphs: 25 %
MCH: 29.6 pg (ref 26.6–33.0)
MCHC: 33 g/dL (ref 31.5–35.7)
MCV: 90 fL (ref 79–97)
MONOCYTES: 9 %
MONOS ABS: 0.6 10*3/uL (ref 0.1–0.9)
Neutrophils Absolute: 3.6 10*3/uL (ref 1.4–7.0)
Neutrophils: 59 %
Platelets: 226 10*3/uL (ref 150–450)
RBC: 3.28 x10E6/uL — AB (ref 3.77–5.28)
RDW: 13 % (ref 11.7–15.4)
WBC: 6.1 10*3/uL (ref 3.4–10.8)

## 2018-02-12 NOTE — Progress Notes (Signed)
Labs are stable.

## 2018-02-12 NOTE — Progress Notes (Signed)
Office Visit Note  Patient: Rose Lutz             Date of Birth: [birth year]           MRN: [id number]             PCP: Jake Samples, PA-C Referring: Jake Samples, [place]* Visit Date: 02/16/2018 Occupation: @GUAROCC @  Subjective:  Left knee pain   History of Present Illness: Rose Lutz is a 68 y.o. female history of rheumatoid arthritis.  She states recently she has been having discomfort in her left knee joint.  None of the other joints are painful or swollen.  She states she had a recent PET scan and there was a small lesion on her liver.  She will have repeat PET scan in 3 months.  Activities of Daily Living:  Patient reports morning stiffness for 0 minute.   Patient Reports nocturnal pain.  Difficulty dressing/grooming: Denies Difficulty climbing stairs: Reports Difficulty getting out of chair: Reports Difficulty using hands for taps, buttons, cutlery, and/or writing: Reports  Review of Systems  Constitutional: Positive for fatigue. Negative for night sweats, weight gain and weight loss.  HENT: Positive for mouth dryness. Negative for mouth sores, trouble swallowing, trouble swallowing and nose dryness.   Eyes: Positive for dryness. Negative for pain, redness and visual disturbance.  Respiratory: Positive for shortness of breath. Negative for cough and difficulty breathing.   Cardiovascular: Negative for chest pain, palpitations, hypertension, irregular heartbeat and swelling in legs/feet.  Gastrointestinal: Negative for blood in stool, constipation and diarrhea.  Endocrine: Negative for increased urination.  Genitourinary: Negative for vaginal dryness.  Musculoskeletal: Positive for arthralgias and joint pain. Negative for joint swelling, myalgias, muscle weakness, morning stiffness, muscle tenderness and myalgias.  Skin: Negative for color change, rash, hair loss, skin tightness, ulcers and sensitivity to sunlight.  Allergic/Immunologic: Negative for  susceptible to infections.  Neurological: Negative for dizziness, memory loss, night sweats and weakness.  Hematological: Negative for swollen glands.  Psychiatric/Behavioral: Positive for depressed mood and sleep disturbance. The patient is nervous/anxious.     PMFS History:  Patient Active Problem List   Diagnosis Date Noted  . Chondrocalcinosis 02/16/2018  . History of total right knee replacement 09/22/2017  . Primary malignant neoplasm of left upper lobe of lung ([place]) 01/22/2017  . Family history of cancer 01/22/2017  . Mass of left lung 12/19/2016  . Rheumatoid arthritis involving multiple sites with positive rheumatoid factor ([place]) 05/06/2016  . High risk medications (not anticoagulants) long-term use 05/06/2016  . Primary osteoarthritis of both hands 05/06/2016  . Primary osteoarthritis of left knee 05/06/2016  . Pain in left ankle and joints of left foot 05/06/2016  . Other fatigue 05/06/2016  . Dizziness and giddiness 02/05/2015  . HTN (hypertension) 02/05/2015    Past Medical History:  Diagnosis Date  . Asbestos exposure    Spot on lung  . Cancer ([place]) 06/2017   LUNG WAS REMOVED  . Chronic pain    On hydrocodone  . Depression   . Dyspnea    on exertion  . Essential hypertension   . GERD (gastroesophageal reflux disease)   . Pulmonary asbestosis [facility])    Patient states mild  . Rheumatoid arthritis (HCC)    and osteoarthritis    Family History  Problem Relation Age of Onset  . Breast cancer Mother   . Colon cancer Maternal Grandmother   . Pancreatic cancer Son    Past Surgical History:  Procedure Laterality  Date  . ABDOMINAL HYSTERECTOMY    . BILATERAL OOPHORECTOMY    . CESAREAN SECTION     x3  . KNEE ARTHROPLASTY Right   . LUNG CANCER SURGERY Left 07/08/2017  . TONSILLECTOMY    . VIDEO BRONCHOSCOPY WITH ENDOBRONCHIAL NAVIGATION Left 12/31/2016   Procedure: VIDEO BRONCHOSCOPY WITH ENDOBRONCHIAL NAVIGATION, left upper lung lobe;  Surgeon: Collene Gobble, MD;  Location: [place];  Service: Thoracic;  Laterality: Left;  . YAG LASER APPLICATION Right 93/26/7124   Procedure: YAG LASER APPLICATION;  Surgeon: Rutherford Guys, MD;  Location[facility];  Service: Ophthalmology;  Laterality: Right;  . YAG LASER APPLICATION Left 58/09/9831   Procedure: YAG LASER APPLICATION;  Surgeon: Rutherford Guys, MD;  [facility];  Service: Ophthalmology;  Laterality: Left;   Social History   Social History Narrative  . Not on file   Immunization History  Administered Date(s) Administered  . Influenza-Unspecified 12/19/2016     Objective: Vital Signs: BP (!) 168/101 (BP Location: Left Arm, Patient Position: Sitting, Cuff Size: Normal)   Pulse 97   Resp 15   Ht 5\' 4"  (1.626 m)   Wt 173 lb (78.5 kg)   BMI 29.70 kg/m    Physical Exam Vitals signs and nursing note reviewed.  Constitutional:      Appearance: She is well-developed.  HENT:     Head: Normocephalic and atraumatic.  Eyes:     Conjunctiva/sclera: Conjunctivae normal.  Neck:     Musculoskeletal: Normal range of motion.  Cardiovascular:     Rate and Rhythm: Normal rate and regular rhythm.     Heart sounds: Normal heart sounds.  Pulmonary:     Effort: Pulmonary effort is normal.     Breath sounds: Normal breath sounds.  Abdominal:     General: Bowel sounds are normal.     Palpations: Abdomen is soft.  Lymphadenopathy:     Cervical: No cervical adenopathy.  Skin:    General: Skin is warm and dry.     Capillary Refill: Capillary refill takes less than 2 seconds.  Neurological:     Mental Status: She is alert and oriented to person, place, and time.  Psychiatric:        Behavior: Behavior normal.      Musculoskeletal Exam: C-spine good range of motion.  She has limited range of motion of bilateral shoulders with abduction 210 degrees on the right and about 100 degrees on the left.  Elbow joints were in good range of motion.  She has very limited range of motion of her wrist joints.  She  is in complete fist formation with thickening of bilateral MCP joints and subluxation of MCP joints.  She has synovitis over left second MCP joint.  She has fairly good range of motion of her hip joints.  Her right knee joint has been replaced which is doing well.  Left knee joint had warmth and swelling.  She has thickening of MTP joints and subluxation of the ankle joint bilaterally.  CDAI Exam: CDAI Score: 5.4  Patient Global Assessment: 7 (mm); Provider Global Assessment: 7 (mm) Swollen: 2 ; Tender: 2  Joint Exam      Right  Left  MCP 2     Swollen Tender  Knee     Swollen Tender     Investigation: No additional findings.  Imaging: Xr Foot 2 Views Left  Result Date: 02/16/2018 Severe narrowing and invagination of all of the MTPs was noted.  Severe erosive changes  were noted in all MTP joints.  PIP and DIP narrowing was noted.  Narrowing of all metatarsal tarsal joint and severe narrowing and fusion of all tarsal joints was noted.  Tibiotalar narrowing and subtalar collapse was noted.  Small calcaneal spur was noted.  Dorsal spurring was noted.  No interval change was noted from when compared to x-rays of October 10, 2016. Impression: These findings are consistent with severe erosive rheumatoid arthritis.  Xr Foot 2 Views Right  Result Date: 02/16/2018 Severe narrowing of first second fourth and fifth MTP joints was noted.  Erosive changes in all MTP joints was noted.  Ankylosis of first MTP and first PIP joint was noted.  PIP and DIP joints narrowing was noted.  Narrowing of metatarsal tarsal and tarsal tarsal joint space was noted.  Dorsal spurring was noted.  Severe tibiotalar narrowing was noted.  Subtalar joint space collapse was noted.  There was no interval change from x-rays of October 10, 2016. Impression: These findings are consistent with severe erosive rheumatoid arthritis.  Xr Hand 2 View Left  Result Date: 02/16/2018 Severe first second and third MCP joint narrowing  was noted.  Narrowing of all MCP joint with erosive changes were noted.  PIP joint narrowing was noted.  Erosive changes were noted in most PIP joints.  Erosive changes also noted in second DIP joint.  Metacarpal carpal and intercarpal joint space narrowing was noted.  Radiocarpal joint space narrowing was noted.  A healed fracture was noted in the radius. Impression: These findings are consistent with severe erosive rheumatoid arthritis.  No interval change was noted from the x-rays of October 10, 2016.  Xr Hand 2 View Right  Result Date: 02/16/2018 Narrowing of first second and third MCP joint was noted.  Erosive changes were noted in first second and third MCP joints with subluxation of first and second MCP joints.  Juxta-articular osteopenia was noted.  Severe PIP narrowing of second and third digits were noted.  PIP joint narrowing was noted.  CMC narrowing was noted.  Metacarpal carpal joint narrowing was noted.  Severe intercarpal joint space narrowing was noted.  Severe radiocarpal joint space narrowing was noted.  Erosive change was noted in the ulnar styloid.  No change was noted from the x-rays of October 10, 2016. Impression: These findings are consistent with severe erosive rheumatoid arthritis.  Xr Knee 3 View Left  Result Date: 02/16/2018 Moderate medial compartment narrowing with intercondylar osteophytes was noted.  Lateral osteophytes were noted.  Chondrocalcinosis was noted.  Moderate patellofemoral narrowing was noted. Impression: These findings are consistent with moderate osteoarthritis moderate chondromalacia patella and chondrocalcinosis.   Recent Labs: Lab Results  Component Value Date   WBC 6.1 02/11/2018   HGB 9.7 (L) 02/11/2018   PLT 226 02/11/2018   NA 143 02/11/2018   K 4.0 02/11/2018   CL 105 02/11/2018   CO2 23 02/11/2018   GLUCOSE 88 02/11/2018   BUN 16 02/11/2018   CREATININE 1.21 (H) 02/11/2018   BILITOT 0.2 02/11/2018   ALKPHOS 107 02/11/2018   AST 20  02/11/2018   ALT 15 02/11/2018   PROT 6.6 02/11/2018   ALBUMIN 3.6 (L) 02/11/2018   CALCIUM 9.2 02/11/2018   GFRAA 53 (L) 02/11/2018    Speciality Comments: No specialty comments available.  Procedures:  No procedures performed Allergies: Quinine derivatives; Sulfa antibiotics; and Percocet [oxycodone-acetaminophen]   Assessment / Plan:     Visit Diagnoses: Rheumatoid arthritis involving multiple sites with positive rheumatoid factor (HCC) - +  RF, +CCP, +ANA, erosive, contractures: Patient has severe rheumatoid arthritis.  She is having a flare with joint pain and swelling.  The pain is mostly in her left knee joint which had warmth and swelling today.  She is currently on Plaquenil and Arava combination.  I could not inject her left knee joint today due to elevated blood pressure.  I will give her a prednisone taper at low doses starting at 10 mg p.o. daily for 4 days and taper by 2.5 mg every 4 days.  If her blood pressure improves then she can come in for left knee joint injection.  High risk medication use -   Plaquenil 400 mg every evening and Arava 20 mg daily.  No Plaquenil eye exam on file.  Most recent CBC/CMP stable on 02/11/2018 and will monitor every 3 months.  Standing orders are in place. Recommend annual influenza, Pneumovax 23, Prevnar 13, and Shingrix as indicated. (d/c MTX due to elevated creatinine).   Acute pain of left knee -she has been having severe pain and discomfort in her left knee and having difficulty walking.  She was limping.  Plan: XR KNEE 3 VIEW LEFT.  The x-ray was consistent with moderate osteoarthritis, moderate patellofemoral narrowing and chondrocalcinosis.  Her symptoms of left knee joint swelling could also be coming from underlying chondrocalcinosis.  Pain in both hands -she has limited range of motion in her wrist joints MCPs subluxation and synovitis in her left second MCP joint.  Plan: XR Hand 2 View Right, XR Hand 2 View Left severe erosive rheumatoid  arthritis was noted.  No interval change was noted from the x-rays of October 10, 2016.  Pain in both feet -continues to have some discomfort in her feet but no synovitis was noted.  Plan: XR Foot 2 Views Right, XR Foot 2 Views Left.  Severe erosive rheumatoid arthritis was noted.  No interval change was noted from the x-rays of October 10, 2016.  Primary malignant neoplasm of left upper lobe of lung (HCC)-patient reports her most recent PET scan showed some suspicious lesion.  She will have repeat PET scan in 3 months.  Finger clubbing-due to lung disease.  Primary osteoarthritis of left knee  History of total right knee replacement-doing well.  Decreased GFR-which is a stable.  History of hypertension-patient states she was taken off the antihypertensives while she was getting chemotherapy due to low blood pressure.  Today her blood pressure is elevated.  Have advised her to schedule an appointment with her PCP so she can be started on medication to control her blood pressure.  History of anemia  History of gastroesophageal reflux (GERD)   Orders: Orders Placed This Encounter  Procedures  . XR KNEE 3 VIEW LEFT  . XR Hand 2 View Right  . XR Hand 2 View Left  . XR Foot 2 Views Right  . XR Foot 2 Views Left   Meds ordered this encounter  Medications  . predniSONE (DELTASONE) 5 MG tablet    Sig: Take 2 tabs by mouth daily x4 days, 1.5 tabs by mouth daily x4 days, 1 tab by mouth daily x4 days, 1/2 tab by mouth daily x4 days.    Dispense:  20 tablet    Refill:  0    Face-to-face time spent with patient was 30 minutes. Greater than 50% of time was spent in counseling and coordination of care.  Follow-Up Instructions: Return in about 5 months (around 07/18/2018) for Rheumatoid arthritis.   Bo Merino, MD  Note - This record has been created using [employer].  Chart creation errors have been sought, but may not always  have been located. Such creation errors do  not reflect on  the standard of medical care.

## 2018-02-16 ENCOUNTER — Encounter: Payer: Self-pay | Admitting: Physician Assistant

## 2018-02-16 ENCOUNTER — Ambulatory Visit (INDEPENDENT_AMBULATORY_CARE_PROVIDER_SITE_OTHER): Payer: Self-pay

## 2018-02-16 ENCOUNTER — Ambulatory Visit: Payer: Medicare Other | Admitting: Rheumatology

## 2018-02-16 VITALS — BP 168/101 | HR 97 | Resp 15 | Ht 64.0 in | Wt 173.0 lb

## 2018-02-16 DIAGNOSIS — M79641 Pain in right hand: Secondary | ICD-10-CM

## 2018-02-16 DIAGNOSIS — M79672 Pain in left foot: Secondary | ICD-10-CM

## 2018-02-16 DIAGNOSIS — M79671 Pain in right foot: Secondary | ICD-10-CM

## 2018-02-16 DIAGNOSIS — M19041 Primary osteoarthritis, right hand: Secondary | ICD-10-CM

## 2018-02-16 DIAGNOSIS — Z8679 Personal history of other diseases of the circulatory system: Secondary | ICD-10-CM

## 2018-02-16 DIAGNOSIS — C3412 Malignant neoplasm of upper lobe, left bronchus or lung: Secondary | ICD-10-CM | POA: Diagnosis not present

## 2018-02-16 DIAGNOSIS — M1120 Other chondrocalcinosis, unspecified site: Secondary | ICD-10-CM

## 2018-02-16 DIAGNOSIS — Z79899 Other long term (current) drug therapy: Secondary | ICD-10-CM

## 2018-02-16 DIAGNOSIS — R944 Abnormal results of kidney function studies: Secondary | ICD-10-CM

## 2018-02-16 DIAGNOSIS — Z862 Personal history of diseases of the blood and blood-forming organs and certain disorders involving the immune mechanism: Secondary | ICD-10-CM

## 2018-02-16 DIAGNOSIS — M79642 Pain in left hand: Secondary | ICD-10-CM | POA: Diagnosis not present

## 2018-02-16 DIAGNOSIS — M1712 Unilateral primary osteoarthritis, left knee: Secondary | ICD-10-CM

## 2018-02-16 DIAGNOSIS — R683 Clubbing of fingers: Secondary | ICD-10-CM

## 2018-02-16 DIAGNOSIS — M0579 Rheumatoid arthritis with rheumatoid factor of multiple sites without organ or systems involvement: Secondary | ICD-10-CM

## 2018-02-16 DIAGNOSIS — Z8719 Personal history of other diseases of the digestive system: Secondary | ICD-10-CM

## 2018-02-16 DIAGNOSIS — M25562 Pain in left knee: Secondary | ICD-10-CM

## 2018-02-16 DIAGNOSIS — Z96651 Presence of right artificial knee joint: Secondary | ICD-10-CM

## 2018-02-16 DIAGNOSIS — M19042 Primary osteoarthritis, left hand: Secondary | ICD-10-CM

## 2018-02-16 MED ORDER — PREDNISONE 5 MG PO TABS: ORAL_TABLET | ORAL | 0 refills | Status: DC

## 2018-02-16 NOTE — Patient Instructions (Signed)
Standing Labs We placed an order today for your standing lab work.    Please come back and get your standing labs in April and every 3 months  We have open lab Monday through Friday from 8:30-11:30 AM and 1:30-4:00 PM  at the office of Dr. Zani Kyllonen.   You may experience shorter wait times on Monday and Friday afternoons. The office is located at [address], [address], [place], [place] [address] No appointment is necessary.   Labs are drawn by [facility].  You may receive a bill from [facility] for your lab work.  If you wish to have your labs drawn at another location, please call the office 24 hours in advance to send orders.  If you have any questions regarding directions or hours of operation,  please call [phone].   Just as a reminder please drink plenty of water prior to coming for your lab work. Thanks!  

## 2018-02-18 DIAGNOSIS — H6121 Impacted cerumen, right ear: Secondary | ICD-10-CM | POA: Diagnosis not present

## 2018-02-18 DIAGNOSIS — I10 Essential (primary) hypertension: Secondary | ICD-10-CM | POA: Diagnosis not present

## 2018-02-22 DIAGNOSIS — Z9221 Personal history of antineoplastic chemotherapy: Secondary | ICD-10-CM | POA: Diagnosis not present

## 2018-02-22 DIAGNOSIS — W1839XA Other fall on same level, initial encounter: Secondary | ICD-10-CM | POA: Diagnosis not present

## 2018-02-22 DIAGNOSIS — J449 Chronic obstructive pulmonary disease, unspecified: Secondary | ICD-10-CM | POA: Diagnosis not present

## 2018-02-22 DIAGNOSIS — S0990XA Unspecified injury of head, initial encounter: Secondary | ICD-10-CM | POA: Diagnosis not present

## 2018-02-22 DIAGNOSIS — S7292XA Unspecified fracture of left femur, initial encounter for closed fracture: Secondary | ICD-10-CM | POA: Diagnosis not present

## 2018-02-22 DIAGNOSIS — Z87891 Personal history of nicotine dependence: Secondary | ICD-10-CM | POA: Diagnosis not present

## 2018-02-22 DIAGNOSIS — Z96651 Presence of right artificial knee joint: Secondary | ICD-10-CM | POA: Diagnosis not present

## 2018-02-22 DIAGNOSIS — Z79899 Other long term (current) drug therapy: Secondary | ICD-10-CM | POA: Diagnosis not present

## 2018-02-22 DIAGNOSIS — W19XXXA Unspecified fall, initial encounter: Secondary | ICD-10-CM | POA: Diagnosis not present

## 2018-02-22 DIAGNOSIS — R0600 Dyspnea, unspecified: Secondary | ICD-10-CM | POA: Diagnosis not present

## 2018-02-22 DIAGNOSIS — R0902 Hypoxemia: Secondary | ICD-10-CM | POA: Diagnosis not present

## 2018-02-22 DIAGNOSIS — R52 Pain, unspecified: Secondary | ICD-10-CM | POA: Diagnosis not present

## 2018-02-22 DIAGNOSIS — S72142A Displaced intertrochanteric fracture of left femur, initial encounter for closed fracture: Secondary | ICD-10-CM | POA: Diagnosis not present

## 2018-02-22 DIAGNOSIS — S72002A Fracture of unspecified part of neck of left femur, initial encounter for closed fracture: Secondary | ICD-10-CM | POA: Diagnosis not present

## 2018-02-22 DIAGNOSIS — Z96641 Presence of right artificial hip joint: Secondary | ICD-10-CM | POA: Diagnosis not present

## 2018-02-22 DIAGNOSIS — S72142D Displaced intertrochanteric fracture of left femur, subsequent encounter for closed fracture with routine healing: Secondary | ICD-10-CM | POA: Diagnosis not present

## 2018-02-22 DIAGNOSIS — Z96642 Presence of left artificial hip joint: Secondary | ICD-10-CM | POA: Diagnosis not present

## 2018-02-22 DIAGNOSIS — M069 Rheumatoid arthritis, unspecified: Secondary | ICD-10-CM | POA: Diagnosis not present

## 2018-02-22 DIAGNOSIS — J309 Allergic rhinitis, unspecified: Secondary | ICD-10-CM | POA: Diagnosis not present

## 2018-02-22 DIAGNOSIS — R2689 Other abnormalities of gait and mobility: Secondary | ICD-10-CM | POA: Diagnosis not present

## 2018-02-22 DIAGNOSIS — S8992XA Unspecified injury of left lower leg, initial encounter: Secondary | ICD-10-CM | POA: Diagnosis not present

## 2018-02-22 DIAGNOSIS — Z9181 History of falling: Secondary | ICD-10-CM | POA: Diagnosis not present

## 2018-02-22 DIAGNOSIS — K219 Gastro-esophageal reflux disease without esophagitis: Secondary | ICD-10-CM | POA: Diagnosis not present

## 2018-02-22 DIAGNOSIS — S299XXA Unspecified injury of thorax, initial encounter: Secondary | ICD-10-CM | POA: Diagnosis not present

## 2018-02-22 DIAGNOSIS — S72141A Displaced intertrochanteric fracture of right femur, initial encounter for closed fracture: Secondary | ICD-10-CM | POA: Diagnosis not present

## 2018-02-22 DIAGNOSIS — Z902 Acquired absence of lung [part of]: Secondary | ICD-10-CM | POA: Diagnosis not present

## 2018-02-22 DIAGNOSIS — M6281 Muscle weakness (generalized): Secondary | ICD-10-CM | POA: Diagnosis not present

## 2018-02-22 DIAGNOSIS — Z780 Asymptomatic menopausal state: Secondary | ICD-10-CM | POA: Diagnosis not present

## 2018-02-22 DIAGNOSIS — Z85118 Personal history of other malignant neoplasm of bronchus and lung: Secondary | ICD-10-CM | POA: Diagnosis not present

## 2018-02-22 DIAGNOSIS — G629 Polyneuropathy, unspecified: Secondary | ICD-10-CM | POA: Diagnosis not present

## 2018-02-23 DIAGNOSIS — Z85118 Personal history of other malignant neoplasm of bronchus and lung: Secondary | ICD-10-CM | POA: Diagnosis not present

## 2018-02-23 DIAGNOSIS — S72142A Displaced intertrochanteric fracture of left femur, initial encounter for closed fracture: Secondary | ICD-10-CM | POA: Diagnosis not present

## 2018-02-24 ENCOUNTER — Telehealth: Payer: Self-pay | Admitting: Rheumatology

## 2018-02-24 NOTE — Telephone Encounter (Signed)
Patient called to let Dr. Estanislado Pandy know that she is in the hospital with a fx hip and had surgery yesterday.

## 2018-02-26 DIAGNOSIS — R2689 Other abnormalities of gait and mobility: Secondary | ICD-10-CM | POA: Diagnosis not present

## 2018-02-26 DIAGNOSIS — J309 Allergic rhinitis, unspecified: Secondary | ICD-10-CM | POA: Diagnosis not present

## 2018-02-26 DIAGNOSIS — K219 Gastro-esophageal reflux disease without esophagitis: Secondary | ICD-10-CM | POA: Diagnosis not present

## 2018-02-26 DIAGNOSIS — Z09 Encounter for follow-up examination after completed treatment for conditions other than malignant neoplasm: Secondary | ICD-10-CM | POA: Diagnosis not present

## 2018-02-26 DIAGNOSIS — K210 Gastro-esophageal reflux disease with esophagitis: Secondary | ICD-10-CM | POA: Diagnosis not present

## 2018-02-26 DIAGNOSIS — M6281 Muscle weakness (generalized): Secondary | ICD-10-CM | POA: Diagnosis not present

## 2018-02-26 DIAGNOSIS — J449 Chronic obstructive pulmonary disease, unspecified: Secondary | ICD-10-CM | POA: Diagnosis not present

## 2018-02-26 DIAGNOSIS — Z96641 Presence of right artificial hip joint: Secondary | ICD-10-CM | POA: Diagnosis not present

## 2018-02-26 DIAGNOSIS — M25552 Pain in left hip: Secondary | ICD-10-CM | POA: Diagnosis not present

## 2018-02-26 DIAGNOSIS — G629 Polyneuropathy, unspecified: Secondary | ICD-10-CM | POA: Diagnosis not present

## 2018-02-26 DIAGNOSIS — G4700 Insomnia, unspecified: Secondary | ICD-10-CM | POA: Diagnosis not present

## 2018-02-26 DIAGNOSIS — Z9181 History of falling: Secondary | ICD-10-CM | POA: Diagnosis not present

## 2018-02-26 DIAGNOSIS — S72142D Displaced intertrochanteric fracture of left femur, subsequent encounter for closed fracture with routine healing: Secondary | ICD-10-CM | POA: Diagnosis not present

## 2018-02-26 DIAGNOSIS — M069 Rheumatoid arthritis, unspecified: Secondary | ICD-10-CM | POA: Diagnosis not present

## 2018-02-26 DIAGNOSIS — S72002D Fracture of unspecified part of neck of left femur, subsequent encounter for closed fracture with routine healing: Secondary | ICD-10-CM | POA: Diagnosis not present

## 2018-02-28 DIAGNOSIS — G4700 Insomnia, unspecified: Secondary | ICD-10-CM | POA: Diagnosis not present

## 2018-02-28 DIAGNOSIS — M25552 Pain in left hip: Secondary | ICD-10-CM | POA: Diagnosis not present

## 2018-02-28 DIAGNOSIS — J309 Allergic rhinitis, unspecified: Secondary | ICD-10-CM | POA: Diagnosis not present

## 2018-02-28 DIAGNOSIS — K210 Gastro-esophageal reflux disease with esophagitis: Secondary | ICD-10-CM | POA: Diagnosis not present

## 2018-03-10 DIAGNOSIS — M1990 Unspecified osteoarthritis, unspecified site: Secondary | ICD-10-CM | POA: Insufficient documentation

## 2018-03-10 DIAGNOSIS — G620 Drug-induced polyneuropathy: Secondary | ICD-10-CM | POA: Insufficient documentation

## 2018-03-10 DIAGNOSIS — Z09 Encounter for follow-up examination after completed treatment for conditions other than malignant neoplasm: Secondary | ICD-10-CM | POA: Diagnosis not present

## 2018-03-10 DIAGNOSIS — T451X5A Adverse effect of antineoplastic and immunosuppressive drugs, initial encounter: Secondary | ICD-10-CM | POA: Insufficient documentation

## 2018-03-10 DIAGNOSIS — S72009D Fracture of unspecified part of neck of unspecified femur, subsequent encounter for closed fracture with routine healing: Secondary | ICD-10-CM | POA: Insufficient documentation

## 2018-03-10 DIAGNOSIS — S72002D Fracture of unspecified part of neck of left femur, subsequent encounter for closed fracture with routine healing: Secondary | ICD-10-CM | POA: Diagnosis not present

## 2018-03-10 DIAGNOSIS — G8929 Other chronic pain: Secondary | ICD-10-CM | POA: Insufficient documentation

## 2018-03-10 DIAGNOSIS — F32A Depression, unspecified: Secondary | ICD-10-CM | POA: Insufficient documentation

## 2018-03-14 ENCOUNTER — Other Ambulatory Visit: Payer: Self-pay | Admitting: Physician Assistant

## 2018-03-15 NOTE — Telephone Encounter (Signed)
Last Visit: 02/16/18 Next Visit due June 2020. Message sent to front to schedule patient  Labs: 02/11/18 stable  Okay to refill per Dr. Estanislado Pandy

## 2018-03-15 NOTE — Telephone Encounter (Signed)
Please schedule patient for follow up visit. Patient due June 2020. Thanks!

## 2018-03-15 NOTE — Telephone Encounter (Signed)
Left message with patient's husband Thayer Jew to call back and schedule follow-up appointment.

## 2018-03-19 DIAGNOSIS — M069 Rheumatoid arthritis, unspecified: Secondary | ICD-10-CM | POA: Diagnosis not present

## 2018-03-19 DIAGNOSIS — S72001S Fracture of unspecified part of neck of right femur, sequela: Secondary | ICD-10-CM | POA: Diagnosis not present

## 2018-03-19 DIAGNOSIS — S72002S Fracture of unspecified part of neck of left femur, sequela: Secondary | ICD-10-CM | POA: Diagnosis not present

## 2018-04-07 DIAGNOSIS — D649 Anemia, unspecified: Secondary | ICD-10-CM | POA: Diagnosis not present

## 2018-04-07 DIAGNOSIS — S72002D Fracture of unspecified part of neck of left femur, subsequent encounter for closed fracture with routine healing: Secondary | ICD-10-CM | POA: Diagnosis not present

## 2018-04-09 ENCOUNTER — Other Ambulatory Visit (HOSPITAL_COMMUNITY): Payer: Self-pay | Admitting: Family Medicine

## 2018-04-09 ENCOUNTER — Encounter (HOSPITAL_COMMUNITY): Payer: Self-pay

## 2018-04-09 ENCOUNTER — Other Ambulatory Visit: Payer: Self-pay

## 2018-04-09 ENCOUNTER — Ambulatory Visit (HOSPITAL_COMMUNITY)
Admission: RE | Admit: 2018-04-09 | Discharge: 2018-04-09 | Disposition: A | Discharge status: Home / Self Care | Payer: Medicare Other | Source: Ambulatory Visit | Attending: Family Medicine | Admitting: Family Medicine

## 2018-04-09 DIAGNOSIS — R0602 Shortness of breath: Secondary | ICD-10-CM | POA: Diagnosis not present

## 2018-04-09 DIAGNOSIS — I10 Essential (primary) hypertension: Secondary | ICD-10-CM | POA: Diagnosis not present

## 2018-04-09 DIAGNOSIS — R0609 Other forms of dyspnea: Secondary | ICD-10-CM | POA: Diagnosis not present

## 2018-04-09 DIAGNOSIS — R0600 Dyspnea, unspecified: Secondary | ICD-10-CM

## 2018-04-09 DIAGNOSIS — C3490 Malignant neoplasm of unspecified part of unspecified bronchus or lung: Secondary | ICD-10-CM | POA: Diagnosis not present

## 2018-04-16 ENCOUNTER — Other Ambulatory Visit: Payer: Self-pay

## 2018-04-16 ENCOUNTER — Ambulatory Visit (INDEPENDENT_AMBULATORY_CARE_PROVIDER_SITE_OTHER): Payer: Medicare Other | Admitting: Cardiology

## 2018-04-16 ENCOUNTER — Encounter: Payer: Self-pay | Admitting: Cardiology

## 2018-04-16 VITALS — BP 138/86 | HR 101 | Ht 63.0 in | Wt 168.0 lb

## 2018-04-16 DIAGNOSIS — Z85118 Personal history of other malignant neoplasm of bronchus and lung: Secondary | ICD-10-CM | POA: Diagnosis not present

## 2018-04-16 DIAGNOSIS — R0609 Other forms of dyspnea: Secondary | ICD-10-CM

## 2018-04-16 DIAGNOSIS — M069 Rheumatoid arthritis, unspecified: Secondary | ICD-10-CM

## 2018-04-16 DIAGNOSIS — R0600 Dyspnea, unspecified: Secondary | ICD-10-CM

## 2018-04-16 DIAGNOSIS — Z8679 Personal history of other diseases of the circulatory system: Secondary | ICD-10-CM

## 2018-04-16 DIAGNOSIS — Z8781 Personal history of (healed) traumatic fracture: Secondary | ICD-10-CM

## 2018-04-16 NOTE — Patient Instructions (Signed)
Medication Instructions:  Your physician recommends that you continue on your current medications as directed. Please refer to the Current Medication list given to you today.   Labwork: none  Testing/Procedures: Your physician has requested that you have an echocardiogram. Echocardiography is a painless test that uses sound waves to create images of your heart. It provides your doctor with information about the size and shape of your heart and how well your heart's chambers and valves are working. This procedure takes approximately one hour. There are no restrictions for this procedure.    Follow-Up: Your physician recommends that you schedule a follow-up appointment in: pending test results    Any Other Special Instructions Will Be Listed Below (If Applicable).     If you need a refill on your cardiac medications before your next appointment, please call your pharmacy.

## 2018-04-16 NOTE — Progress Notes (Signed)
Cardiology Office Note  Date: 04/16/2018   ID: Rose Lutz, DOB 11/20/1950, MRN [id number]  PCP: Jake Samples, PA-C  Consulting Cardiologist: Rozann Lesches, MD   Chief Complaint  Patient presents with   Shortness of Breath     History of Present Illness: Rose Lutz is a 68 y.o. female referred for cardiology consultation by Ms. Glennon Mac PA-C for the evaluation of dyspnea on exertion.   I was able to review some of her outpatient medical records.  She follows at [facility] in the oncology clinic with a history of left lung cancer status post left upper lobectomy and left lower superior segmentectomy as well as chemotherapy (SCOLG54 PEMEtrexed, CARBOplatin, CISplatin) in 2019. Echocardiogram from June 2019 showed LVEF 60-65% range, no major valvular abnormalities, and estimated RVSP 29 mmHg.  She is also status post recent left hip surgery due to intertrochanteric fracture, follows with Dr. Case in [place].  She states that she has had some degree of shortness of breath since undergoing lung surgery and chemotherapy last year, although this got worse after her hip fracture and hospital stay recently in February.  She is at home now, was not able to complete a full rehabilitation session.  She uses a rolling walker.  She is fatigued and short of breath with moving around but does not report exertional chest pain, palpitations, or syncope.  Furthermore, she has had no fevers or chills, no cough or diarrhea, not around any obvious sick contacts.  She has been following with her PCP, I reviewed her recent lab work and also chest x-ray results.  Her last chest CT in [place] was in January as detailed below.  There were concerns about a region within the liver and there is planned follow-up with PET scan to exclude potential malignancy.  She was seen for cardiology consultation back in January 2017 for the evaluation of orthostatic lightheadedness.  Antihypertensive dosing  was reduced at that time and she underwent an echocardiogram which showed overall normal LVEF and no major valvular abnormalities.  No further cardiac testing was obtained.  Today we had her ambulate in the clinic with pulse oximeter, saturations did not drop below 90%.  Past Medical History:  Diagnosis Date   Chronic pain    Depression    Essential hypertension    GERD (gastroesophageal reflux disease)    History of lung cancer 06/2017   Osteoarthritis    Pulmonary asbestosis ([place])    Rheumatoid arthritis ([place])     Past Surgical History:  Procedure Laterality Date   ABDOMINAL HYSTERECTOMY     BILATERAL OOPHORECTOMY     CESAREAN SECTION     x3   KNEE ARTHROPLASTY Right    LUNG CANCER SURGERY Left 07/08/2017   TONSILLECTOMY     VIDEO BRONCHOSCOPY WITH ENDOBRONCHIAL NAVIGATION Left 12/31/2016   Procedure: VIDEO BRONCHOSCOPY WITH ENDOBRONCHIAL NAVIGATION, left upper lung lobe;  Surgeon: Collene Gobble, MD;  Location: [facility];  Service: Thoracic;  Laterality: Left;   YAG LASER APPLICATION Right 40/98/1191   Procedure: YAG LASER APPLICATION;  Surgeon: Rutherford Guys, MD;  Location: [facility];  Service: Ophthalmology;  Laterality: Right;   YAG LASER APPLICATION Left 47/82/9562   Procedure: YAG LASER APPLICATION;  Surgeon: Rutherford Guys, MD;  Location: [facility];  Service: Ophthalmology;  Laterality: Left;    Current Outpatient Medications  Medication Sig Dispense Refill   amitriptyline (ELAVIL) 100 MG tablet Take 100 mg by mouth at bedtime.     Ascorbic  Acid (VITAMIN C) 250 MG CHEW Chew 1 tablet by mouth daily.     cetirizine (ZYRTEC) 10 MG tablet Take 10 mg by mouth at bedtime.     diphenhydrAMINE (BENADRYL) 25 mg capsule Take 50-100 mg by mouth every 6 (six) hours as needed for itching or allergies.     folic acid (FOLVITE) 1 MG tablet Take 1 tablet (1 mg total) by mouth daily. 90 tablet 4   gabapentin (NEURONTIN) 100 MG capsule TK 1 C PO TID  5    HYDROcodone-acetaminophen (NORCO) 10-325 MG tablet Take 1 tablet by mouth every 6 (six) hours as needed for moderate pain.      hydroxychloroquine (PLAQUENIL) 200 MG tablet Take 2 tablets (400 mg total) by mouth every evening. Monday through Friday 120 tablet 0   leflunomide (ARAVA) 10 MG tablet Take 2 tablets (20 mg total) by mouth daily. 60 tablet 2   Multiple Vitamin (MULTIVITAMIN WITH MINERALS) TABS tablet Take 1 tablet by mouth daily.     omeprazole (PRILOSEC) 20 MG capsule Take 20 mg by mouth daily.     Vitamin D, Ergocalciferol, (DRISDOL) 50000 units CAPS capsule Take 1 capsule (50,000 Units total) by mouth every 7 (seven) days. 12 capsule 0   No current facility-administered medications for this visit.    Allergies:  Quinine derivatives; Sulfa antibiotics; and Percocet [oxycodone-acetaminophen]   Social History: The patient  reports that she quit smoking about 12 years ago. Her smoking use included cigarettes. She started smoking about 50 years ago. She has a 47.50 pack-year smoking history. She has never used smokeless tobacco. She reports that she does not drink alcohol or use drugs.   Family History: The patient's family history includes Breast cancer in her mother; Colon cancer in her maternal grandmother; Pancreatic cancer in her son.   ROS:  Please see the history of present illness. Otherwise, complete review of systems is positive for fatigue, chronic arthritic symptoms.  All other systems are reviewed and negative.   Physical Exam: VS:  BP 138/86    Pulse (!) 101    Ht 5\' 3"  (1.6 m)    Wt 168 lb (76.2 kg)    SpO2 98%    BMI 29.76 kg/m , BMI Body mass index is 29.76 kg/m.  Wt Readings from Last 3 Encounters:  04/16/18 168 lb (76.2 kg)  02/16/18 173 lb (78.5 kg)  10/28/17 169 lb 9.6 oz (76.9 kg)    General: Chronically ill-appearing woman, wearing facemask, in no distress. HEENT: Conjunctiva and lids normal. Neck: Supple, no elevated JVP or carotid bruits, no  thyromegaly. Lungs: Absent breath sounds left base, decreased throughout without wheezing or rhonchi. Cardiac: Regular rate and rhythm, no S3, soft systolic murmur, no pericardial rub. Abdomen: Soft, nontender, bowel sounds present, no guarding or rebound. Extremities: Chronic appearing trace ankle edema and venous stasis, distal pulses 2+. Skin: Warm and dry. Musculoskeletal: No kyphosis. Neuropsychiatric: Alert and oriented x3, affect grossly appropriate.  ECG: I personally reviewed the tracing from 12/31/2016 which showed sinus tachycardia.  Recent Labwork: 02/11/2018: ALT 15; AST 20; BUN 16; Creatinine, Ser 1.21; Hemoglobin 9.7; Platelets 226; Potassium 4.0; Sodium 143  January 2020: BNP 103, cholesterol 202, triglycerides 131, HDL 45, LDL 131, hemoglobin 9.7, potassium 4.0, BUN 16, creatinine 1.21, AST 20, ALT 15  Other Studies Reviewed Today:  Echocardiogram 02/13/2015: Study Conclusions  - Left ventricle: The cavity size was normal. Wall thickness was   normal. Systolic function was normal. The estimated ejection  fraction was in the range of 60% to 65%. Wall motion was normal;   there were no regional wall motion abnormalities. Left   ventricular diastolic function parameters were normal for the   patient&'s age. - Mitral valve: There was trivial regurgitation. - Right atrium: Central venous pressure (est): 3 mm Hg. - Atrial septum: No defect or patent foramen ovale was identified. - Tricuspid valve: There was trivial regurgitation. - Pulmonary arteries: Systolic pressure could not be accurately   estimated. - Pericardium, extracardiac: There was no pericardial effusion.  Impressions:  - Normal LV wall thickness with LVEF 60-65% and normal diastolic   function. Trivial mitral and tricuspid regurgitation.  Chest x-ray 04/09/2018: FINDINGS: Stable volume loss left hemithorax with basilar scarring. Right lung clear. Cardiopericardial silhouette is at upper limits of  normal for size. The visualized bony structures of the thorax are intact.  IMPRESSION: Stable.  No acute cardiopulmonary findings.  Chest/abdominal CT 01/21/2018: FINDINGS: CHEST: Postsurgical changes from left upper lobectomy with superior segmentectomy. Stable focal atelectasis along suture line. Moderate diffuse centrilobular emphysema again noted. Trace left-sided pleural effusion decreased since prior exam. Minimal scarring seen within the anterior aspect of the left lower lobe is again seen. No suspicious nodules appearing throughout the right lung or left lower lobe with redemonstration of few scattered micronodules, benign-appearing. No new nodules appearing. The heart is top normal size without pericardial effusion. Aorta and main pulmonary artery are stable in caliber. Main pulmonary artery is mildly dilated. No evidence of pulmonary embolism. Trachea is clear. Heterogeneous asymmetrically atrophic thyroid with microcalcifications. Moderate right glenohumeral arthrosis with probable old fracture deformity. Multilevel degenerative change seen throughout the thoracic spine with mild midthoracic compression deformities. No new fracture or suspicious lesions seen. ABDOMEN: Interval appearance of 6 mm hypodense lesion seen within segment 5, appearing since 06/22/2017 exam, concerning for metastasis. No additional lesions seen. Hepatic and portal veins are patent. Gallbladder is unremarkable. Fatty replacement of the pancreas identified without duct dilation or inflammatory changes. Spleen is normal size without focal lesion. Splenic vein is patent. No adrenal nodule seen. Marked atrophy of the left kidney is again noted. There is a right-sided cortical scarring and cysts are again seen. No acute inflammatory changes present. Mild hiatal hernia present. Evidence of gastroesophageal reflux. Imaged small and large bowel loops are normal in caliber without evidence of imaged enteritis or colitis. No upper  abdominal lymphadenopathy, ascites, free air or evidence of intraperitoneal carcinomatosis. Aorta is normal in caliber with diffuse calcified noncalcified plaque. Imaged branch vessels patent. SMV is patent. Imaged IVC patent. Imaged lumbar spine intact without focal lesion.   IMPRESSION: 1. Postsurgical changes from left upper lobectomy and left lower superior segmentectomy without evidence of thoracic recurrent disease 2. Interval appearance of 6 mm hypodense lesion seen within right hepatic lobe, possible metastasis as lesion was not visualized on prior contrast enhanced CT or PET scan Images and interpretation personally reviewed by: Orlean Patten, MD  Echocardiogram 07/07/2017:  FINDINGS   1. Left Ventricle: Normal left ventricular cavity size. Normal left   ventricular wall thickness. Normal global left ventricular systolic   function. Normal regional wall motion. EF estimated at 60-65%.   Impaired relaxation filling pattern,may be normal for age. Normal   E/e'.   2. Right Ventricle: Normal right ventricular size. Normal right   ventricular global systolic function.   3. Left Atrium: Normal left atrial size.   4. Right Atrium: Normal right atrial size.   5. Interatrial Septum: No patent foramen  ovale present by color   Doppler.   6. Mitral Valve: Structurally normal mitral valve. No mitral stenosis.   Trace mitral regurgitation.   7. Aortic Valve: Trileaflet aortic valve. No aortic stenosis. No aortic   regurgitation noted.   8. Tricuspid Valve: Normally structured tricuspid valve. No   tricuspid stenosis. Trace tricuspid regurgitation noted. RVSP (PA   systolic pressure from tricuspid regurgitation jet) is estimated at    46mmHg. Right atrial pressure estimate of  23mmHg. Normal   pulmonary artery systolic pressure. IVC diameter is normal with   normal respirophasic motion >50 percent compression with sniff   test.   9. Pulmonic Valve: Not well imaged.  No pulmonic stenosis. Trace   pulmonary regurgitation.   10. Masses and Vegetations: No masses or vegetations noted.   11. Aorta: Aortic root is normal in size. Ascending aorta is normal   in size. Normal aortic arch. No Doppler evidence of coarctation.   12. Pericardium: No pericardial effusion.   Assessment and Plan:  1.  Dyspnea on exertion, most likely multifactorial in light of her comorbidities.  She has a history of lung cancer status post left upper lobectomy and left lower superior segmentectomy as well as chemotherapy in 2019, currently anemic with last hemoglobin 9.7, and also status post left hip fracture with surgery and reduced functional capacity since February.  She feels like her shortness of breath has been worse since February.  She did not substantially desaturate while walking today and is in no obvious distress.  Also no fevers, cough, or hemoptysis.  Plan is to obtain a follow-up echocardiogram to reassess LVEF, also hopefully to evaluate RV function and PASP.  It may be worth considering a CT angiogram of the chest to exclude pulmonary embolus depending on results.  For now no changes were made to her current regimen.  Of note, she was recently prescribed a diuretic by PCP although has not started it as yet.  2.  History of essential hypertension, currently not on standing antihypertensive regimen.  Medical therapy was weaned in the past related to hypotension.  3.  Rheumatoid arthritis.  Patient is on Plaquenil and Arava, followed by rheumatology.  Current medicines were reviewed with the patient today.   Orders Placed This Encounter  Procedures   ECHOCARDIOGRAM COMPLETE    Disposition: Call with test results.  Signed, Satira Sark, MD, [facility] 04/16/2018 12:16 PM    [place] at [facility] [address] [place], [place] [address] Phone: [phone]; Fax: [phone]

## 2018-04-28 ENCOUNTER — Other Ambulatory Visit: Payer: Self-pay | Admitting: *Deleted

## 2018-04-28 MED ORDER — LEFLUNOMIDE 10 MG PO TABS: 20.0000 mg | ORAL_TABLET | Freq: Every day | ORAL | 0 refills | Status: DC

## 2018-04-28 MED ORDER — HYDROXYCHLOROQUINE SULFATE 200 MG PO TABS: 400.0000 mg | ORAL_TABLET | Freq: Every evening | ORAL | 0 refills | Status: DC

## 2018-04-28 NOTE — Telephone Encounter (Signed)
Refill request received via fax  Last Visit: 02/16/18 Next Visit: 07/14/18 Labs: 02/11/18 stable  PLQ Eye Exam: 10/28/16 WNL Okay to refill per Dr. Estanislado Pandy

## 2018-05-11 ENCOUNTER — Ambulatory Visit (HOSPITAL_COMMUNITY)
Admission: RE | Admit: 2018-05-11 | Discharge: 2018-05-11 | Disposition: A | Discharge status: Home / Self Care | Payer: Medicare Other | Source: Ambulatory Visit | Attending: Cardiology | Admitting: Cardiology

## 2018-05-11 ENCOUNTER — Other Ambulatory Visit: Payer: Self-pay

## 2018-05-11 DIAGNOSIS — R0600 Dyspnea, unspecified: Secondary | ICD-10-CM

## 2018-05-11 DIAGNOSIS — R0609 Other forms of dyspnea: Secondary | ICD-10-CM | POA: Diagnosis not present

## 2018-05-11 NOTE — Progress Notes (Signed)
*  PRELIMINARY RESULTS* Echocardiogram 2D Echocardiogram has been performed.  Rose Lutz 05/11/2018, 12:25 PM

## 2018-05-14 ENCOUNTER — Telehealth: Payer: Self-pay | Admitting: Rheumatology

## 2018-05-14 ENCOUNTER — Other Ambulatory Visit: Payer: Self-pay | Admitting: *Deleted

## 2018-05-14 DIAGNOSIS — G894 Chronic pain syndrome: Secondary | ICD-10-CM | POA: Diagnosis not present

## 2018-05-14 DIAGNOSIS — Z79899 Other long term (current) drug therapy: Secondary | ICD-10-CM

## 2018-05-14 NOTE — Telephone Encounter (Signed)
Lab Orders released.  

## 2018-05-14 NOTE — Telephone Encounter (Signed)
Patient called requesting her labwork orders be sent to [facility] in [place].  Patient states she will be going on Monday 05/17/18.

## 2018-05-27 DIAGNOSIS — Z79899 Other long term (current) drug therapy: Secondary | ICD-10-CM | POA: Diagnosis not present

## 2018-05-28 LAB — CMP14+EGFR
ALT: 18 IU/L (ref 0–32)
AST: 26 IU/L (ref 0–40)
Albumin/Globulin Ratio: 1.4 (ref 1.2–2.2)
Albumin: 3.8 g/dL (ref 3.8–4.8)
Alkaline Phosphatase: 119 IU/L — ABNORMAL HIGH (ref 39–117)
BUN/Creatinine Ratio: 16 (ref 12–28)
BUN: 21 mg/dL (ref 8–27)
Bilirubin Total: 0.2 mg/dL (ref 0.0–1.2)
CO2: 25 mmol/L (ref 20–29)
Calcium: 9.5 mg/dL (ref 8.7–10.3)
Chloride: 101 mmol/L (ref 96–106)
Creatinine, Ser: 1.33 mg/dL — ABNORMAL HIGH (ref 0.57–1.00)
GFR calc Af Amer: 47 mL/min/{1.73_m2} — ABNORMAL LOW (ref >59)
GFR calc non Af Amer: 41 mL/min/{1.73_m2} — ABNORMAL LOW (ref >59)
Globulin, Total: 2.7 g/dL (ref 1.5–4.5)
Glucose: 103 mg/dL — ABNORMAL HIGH (ref 65–99)
Potassium: 3.3 mmol/L — ABNORMAL LOW (ref 3.5–5.2)
Sodium: 142 mmol/L (ref 134–144)
Total Protein: 6.5 g/dL (ref 6.0–8.5)

## 2018-05-28 LAB — CBC WITH DIFFERENTIAL/PLATELET
Basophils Absolute: 0.1 10*3/uL (ref 0.0–0.2)
Basos: 1 %
EOS (ABSOLUTE): 0.7 10*3/uL — ABNORMAL HIGH (ref 0.0–0.4)
Eos: 9 %
Hematocrit: 31.7 % — ABNORMAL LOW (ref 34.0–46.6)
Hemoglobin: 10.4 g/dL — ABNORMAL LOW (ref 11.1–15.9)
Immature Grans (Abs): 0 10*3/uL (ref 0.0–0.1)
Immature Granulocytes: 0 %
Lymphocytes Absolute: 2.4 10*3/uL (ref 0.7–3.1)
Lymphs: 33 %
MCH: 30.5 pg (ref 26.6–33.0)
MCHC: 32.8 g/dL (ref 31.5–35.7)
MCV: 93 fL (ref 79–97)
Monocytes Absolute: 0.7 10*3/uL (ref 0.1–0.9)
Monocytes: 9 %
Neutrophils Absolute: 3.5 10*3/uL (ref 1.4–7.0)
Neutrophils: 48 %
Platelets: 225 10*3/uL (ref 150–450)
RBC: 3.41 x10E6/uL — ABNORMAL LOW (ref 3.77–5.28)
RDW: 13.7 % (ref 11.7–15.4)
WBC: 7.3 10*3/uL (ref 3.4–10.8)

## 2018-05-28 NOTE — Progress Notes (Signed)
Pl fax results to her PCP and nephrologist. K is low. Notify pt.

## 2018-05-28 NOTE — Progress Notes (Signed)
Anemia is improving.  We will continue to monitor.  Please forward labs to PCP.

## 2018-05-28 NOTE — Progress Notes (Signed)
Please see Dr. Deveshwar's above note.

## 2018-06-11 DIAGNOSIS — Z1389 Encounter for screening for other disorder: Secondary | ICD-10-CM | POA: Diagnosis not present

## 2018-06-11 DIAGNOSIS — C3491 Malignant neoplasm of unspecified part of right bronchus or lung: Secondary | ICD-10-CM | POA: Diagnosis not present

## 2018-06-11 DIAGNOSIS — I10 Essential (primary) hypertension: Secondary | ICD-10-CM | POA: Diagnosis not present

## 2018-06-11 DIAGNOSIS — M069 Rheumatoid arthritis, unspecified: Secondary | ICD-10-CM | POA: Diagnosis not present

## 2018-06-11 DIAGNOSIS — R160 Hepatomegaly, not elsewhere classified: Secondary | ICD-10-CM | POA: Diagnosis not present

## 2018-06-15 ENCOUNTER — Other Ambulatory Visit: Payer: Self-pay | Admitting: Family Medicine

## 2018-06-15 ENCOUNTER — Other Ambulatory Visit (HOSPITAL_COMMUNITY): Payer: Self-pay | Admitting: Family Medicine

## 2018-06-15 DIAGNOSIS — R160 Hepatomegaly, not elsewhere classified: Secondary | ICD-10-CM

## 2018-06-23 ENCOUNTER — Other Ambulatory Visit (HOSPITAL_COMMUNITY): Payer: Self-pay | Admitting: Family Medicine

## 2018-06-23 ENCOUNTER — Other Ambulatory Visit: Payer: Self-pay | Admitting: Family Medicine

## 2018-06-23 ENCOUNTER — Ambulatory Visit (HOSPITAL_COMMUNITY)
Admission: RE | Admit: 2018-06-23 | Discharge: 2018-06-23 | Disposition: A | Discharge status: Home / Self Care | Payer: Medicare Other | Source: Ambulatory Visit | Attending: Family Medicine | Admitting: Family Medicine

## 2018-06-23 ENCOUNTER — Other Ambulatory Visit: Payer: Self-pay

## 2018-06-23 DIAGNOSIS — N2889 Other specified disorders of kidney and ureter: Secondary | ICD-10-CM | POA: Diagnosis not present

## 2018-06-23 DIAGNOSIS — R160 Hepatomegaly, not elsewhere classified: Secondary | ICD-10-CM | POA: Diagnosis not present

## 2018-06-23 MED ORDER — IOHEXOL 300 MG/ML  SOLN
75.0000 mL | Freq: Once | INTRAMUSCULAR | Status: AC | PRN
  Administered 2018-06-23: 75 mL via INTRAVENOUS

## 2018-06-24 ENCOUNTER — Other Ambulatory Visit: Payer: Self-pay | Admitting: Family Medicine

## 2018-06-24 ENCOUNTER — Other Ambulatory Visit (HOSPITAL_COMMUNITY): Payer: Self-pay | Admitting: Family Medicine

## 2018-06-24 DIAGNOSIS — R160 Hepatomegaly, not elsewhere classified: Secondary | ICD-10-CM

## 2018-06-25 ENCOUNTER — Other Ambulatory Visit: Payer: Self-pay | Admitting: Rheumatology

## 2018-06-25 NOTE — Telephone Encounter (Signed)
Okay to give 30-day supply of Plaquenil.  Please explained to the patient that there is increased risk of ocular toxicity and he been losing vision if the patient is not monitored on regular basis.

## 2018-06-25 NOTE — Telephone Encounter (Addendum)
Last Visit: 02/16/18 Next Visit: 07/14/18 Labs: 05/27/18 Anemia is improving.  PLQ Eye Exam: 10/28/16 WNL  Patient has not updated recently. Advised patient's husband we need patient to schedule an appointment and update as soon as possible.   Okay to refill 30 day supply PLQ?

## 2018-06-29 ENCOUNTER — Other Ambulatory Visit: Payer: Self-pay | Admitting: Rheumatology

## 2018-06-29 NOTE — Telephone Encounter (Signed)
Last Visit: 02/16/2018 Next Visit: 07/14/2018 Labs: 05/27/2018   Okay to refill per Dr. Estanislado Pandy.

## 2018-06-30 NOTE — Progress Notes (Signed)
Office Visit Note  Patient: Rose Lutz             Date of Birth: 1950/07/30           MRN: [id number]             PCP: Jake Samples, PA-C Referring: Jake Samples, [place]* Visit Date: 07/14/2018 Occupation: @GUAROCC @  Subjective:  Left knee joint pain    History of Present Illness: Rose Lutz is a 68 y.o. female with history of seropositive rheumatoid arthritis and osteoarthritis.  Patient is taking Plaquenil 20 mg a mouth daily Plaquenil 200 mg twice daily Monday through Friday.  She denies missing any doses recently.  She denies any recent flares.  She reports that on 02/22/18 she fell in the [place] parking lot, and she fractured her left hip.  She has surgery performed by Dr. Case and was admitted for 19 days for recovery.  She has been cleared by Dr. Case, but she occasionally has left hip pain and left knee joint pain.  She has intermittent swelling in the left knee joint.  She states the right knee replacement is doing well.  She continues to have lower back pain since the fall. She denies any other joint pain or joint swelling.    Activities of Daily Living:  Patient reports morning stiffness for 5 minutes.   Patient Denies nocturnal pain.  Difficulty dressing/grooming: Denies Difficulty climbing stairs: Reports Difficulty getting out of chair: Reports Difficulty using hands for taps, buttons, cutlery, and/or writing: Reports  Review of Systems  Constitutional: Positive for fatigue.  HENT: Positive for mouth dryness. Negative for mouth sores and nose dryness.   Eyes: Positive for dryness. Negative for pain and visual disturbance.  Respiratory: Negative for cough, hemoptysis, shortness of breath and difficulty breathing.   Cardiovascular: Negative for chest pain, palpitations, hypertension and swelling in legs/feet.  Gastrointestinal: Negative for blood in stool, constipation and diarrhea.  Endocrine: Negative for increased urination.  Genitourinary:  Negative for painful urination.  Musculoskeletal: Positive for arthralgias, joint pain, joint swelling and morning stiffness. Negative for myalgias, muscle weakness, muscle tenderness and myalgias.  Skin: Negative for color change, pallor, rash, hair loss, nodules/bumps, skin tightness, ulcers and sensitivity to sunlight.  Allergic/Immunologic: Negative for susceptible to infections.  Neurological: Negative for dizziness, numbness, headaches and weakness.  Hematological: Negative for swollen glands.  Psychiatric/Behavioral: Negative for depressed mood and sleep disturbance. The patient is not nervous/anxious.     PMFS History:  Patient Active Problem List   Diagnosis Date Noted   Chondrocalcinosis 02/16/2018   History of total right knee replacement 09/22/2017   Primary malignant neoplasm of left upper lobe of lung ([place]) 01/22/2017   Family history of cancer 01/22/2017   Mass of left lung 12/19/2016   Rheumatoid arthritis involving multiple sites with positive rheumatoid factor ([place]) 05/06/2016   High risk medications (not anticoagulants) long-term use 05/06/2016   Primary osteoarthritis of both hands 05/06/2016   Primary osteoarthritis of left knee 05/06/2016   Pain in left ankle and joints of left foot 05/06/2016   Other fatigue 05/06/2016   Dizziness and giddiness 02/05/2015   HTN (hypertension) 02/05/2015    Past Medical History:  Diagnosis Date   Chronic pain    Depression    Essential hypertension    GERD (gastroesophageal reflux disease)    History of lung cancer 06/2017   Osteoarthritis    Pulmonary asbestosis ([place])    Rheumatoid arthritis ([place])  Family History  Problem Relation Age of Onset   Breast cancer Mother    Colon cancer Maternal Grandmother    Pancreatic cancer Son    Past Surgical History:  Procedure Laterality Date   ABDOMINAL HYSTERECTOMY     BILATERAL OOPHORECTOMY     CESAREAN SECTION     x3   KNEE ARTHROPLASTY  Right    LUNG CANCER SURGERY Left 07/08/2017   pelvis fracture/surgery     Dr. Cheri Rous   TONSILLECTOMY     VIDEO BRONCHOSCOPY WITH ENDOBRONCHIAL NAVIGATION Left 12/31/2016   Procedure: VIDEO BRONCHOSCOPY WITH ENDOBRONCHIAL NAVIGATION, left upper lung lobe;  Surgeon: Collene Gobble, MD;  Location: [facility];  Service: Thoracic;  Laterality: Left;   YAG LASER APPLICATION Right 86/76/1951   Procedure: YAG LASER APPLICATION;  Surgeon: Rutherford Guys, MD;  Location: [facility];  Service: Ophthalmology;  Laterality: Right;   YAG LASER APPLICATION Left 93/26/7124   Procedure: YAG LASER APPLICATION;  Surgeon: Rutherford Guys, MD;  Location: [facility];  Service: Ophthalmology;  Laterality: Left;   Social History   Social History Narrative   Not on file   Immunization History  Administered Date(s) Administered   Influenza-Unspecified 12/19/2016   Pneumococcal Conjugate-13 10/20/2012   Pneumococcal Polysaccharide-23 07/01/2004, 12/15/2012   Tdap 07/26/2010   Zoster 08/23/2009     Objective: Vital Signs: BP (!) 138/92 (BP Location: Left Arm, Patient Position: Sitting, Cuff Size: Normal)    Pulse (!) 105    Resp 17    Ht 5\' 4"  (1.626 m)    Wt 156 lb (70.8 kg)    BMI 26.78 kg/m    Physical Exam Vitals signs and nursing note reviewed.  Constitutional:      Appearance: She is well-developed.  HENT:     Head: Normocephalic and atraumatic.  Eyes:     Conjunctiva/sclera: Conjunctivae normal.  Neck:     Musculoskeletal: Normal range of motion.  Cardiovascular:     Rate and Rhythm: Normal rate and regular rhythm.     Heart sounds: Normal heart sounds.  Pulmonary:     Effort: Pulmonary effort is normal.     Breath sounds: Normal breath sounds.  Abdominal:     General: Bowel sounds are normal.     Palpations: Abdomen is soft.  Lymphadenopathy:     Cervical: No cervical adenopathy.  Skin:    General: Skin is warm and dry.     Capillary Refill: Capillary refill takes less than 2 seconds.    Neurological:     Mental Status: She is alert and oriented to person, place, and time.  Psychiatric:        Behavior: Behavior normal.      Musculoskeletal Exam: C-spine good range of motion.  Thoracic kyphosis noted.  She has limited range of motion of bilateral shoulder joints.  110 degrees abduction of right shoulder and 100 degrees abduction of left shoulder.  Other joints have good range of motion with no tenderness or inflammation.  She has very limited range of motion of bilateral wrist joints.  She has incomplete fist formation bilaterally.  Synovial thickening of MCP joints and ulnar deviation noted.  Mallet finger of the right second digit.  Left hip replacement has limited range of motion.  Right hip has good range of motion.  Right knee replacement is doing well with good range of motion with no warmth or effusion.  Left knee has warmth but no effusion.  CDAI Exam: CDAI Score: 1.3  Patient Global: 7  mm; Provider Global: 6 mm Swollen: 0 ; Tender: 0  Joint Exam   No joint exam has been documented for this visit   There is currently no information documented on the homunculus. Go to the Rheumatology activity and complete the homunculus joint exam.  Investigation: No additional findings.  Imaging: Ct Abdomen Pelvis W Wo Contrast  Result Date: 07/08/2018 CLINICAL DATA:  Hepatomegaly. Right hepatic lobe mass on CT. Lung cancer. EXAM: CT ABDOMEN AND PELVIS WITHOUT AND WITH CONTRAST TECHNIQUE: Multidetector CT imaging of the abdomen and pelvis was performed following the standard protocol before and following the bolus administration of intravenous contrast. CONTRAST:  79mL OMNIPAQUE IOHEXOL 300 MG/ML  SOLN COMPARISON:  Abdominal CT of 06/23/2018. FINDINGS: Lower chest: Emphysema. Left hemidiaphragm elevation. Small left pleural effusion is similar. Normal heart size. Hepatobiliary: No cirrhosis. Segment 6 arterially peripherally enhancing liver lesion is again identified. Measures on  the order of 3.5 x 2.7 cm versus 3.2 x 2.6 cm on the prior. No new liver lesion. Normal gallbladder, without biliary ductal dilatation. Pancreas: Fatty replacement throughout the pancreas, without duct dilatation or dominant mass. Spleen: Normal in size, without focal abnormality. Adrenals/Urinary Tract: Normal adrenal glands. Marked left renal atrophy. No renal calculi or hydronephrosis. Too small to characterize right renal lesions at maximally 9 mm. Normal urinary bladder. Stomach/Bowel: Apparent gastric wall thickening proximally is at least partially felt to be due to underdistention. Normal colon and terminal ileum. Normal small bowel. Vascular/Lymphatic: Aortic and branch vessel atherosclerosis. Multiple right renal arteries. Portal caval nodes with heterogeneity, possibly related to necrosis. Example of 1.5 cm on image 41/4. Felt to be new compared to 11/21/2016 PET. No pelvic sidewall adenopathy. Reproductive: Hysterectomy.  No adnexal mass. Other: No significant free fluid.  Mild pelvic floor laxity. Musculoskeletal: Left hip arthroplasty. Osteopenia. Lumbosacral spondylosis. IMPRESSION: 1. Similar to minimal enlargement of a segment 6 liver lesion which is new compared to the PET of 11/21/2016 and favored to represent isolated hepatic metastasis. 2. Possible developing porta hepatis adenopathy, also suspicious for metastatic disease. 3. Similar small left pleural effusion. 4.  Aortic Atherosclerosis (ICD10-I70.0). 5. Apparent gastric wall thickening proximally is at least partially felt to be due to underdistention. Cannot exclude gastritis. Electronically Signed   By: Abigail Miyamoto M.D.   On: 07/08/2018 14:11   Ct Abdomen W Contrast  Result Date: 06/23/2018 CLINICAL DATA:  Liver lesion on outside chest CT. Left lung carcinoma. EXAM: CT ABDOMEN WITH CONTRAST TECHNIQUE: Multidetector CT imaging of the abdomen was performed using the triple phase hepatic protocol following bolus administration of  intravenous contrast. CONTRAST:  49mL OMNIPAQUE IOHEXOL 300 MG/ML  SOLN COMPARISON:  None. FINDINGS: Lower chest: Tiny left pleural effusion versus pleural thickening. Hepatobiliary: Low-attenuation mass with peripheral contrast enhancement is seen in segment 5, which measures 3.2 x 2.6 cm. This is suspicious for liver metastasis given history of lung carcinoma, with hepatocellular carcinoma considered less likely. No other liver masses identified. Gallbladder is unremarkable. No evidence of biliary ductal dilatation. Pancreas:  No mass or inflammatory changes. Spleen:  Within normal limits in size and appearance. Adrenals/Urinary Tract: Severe left renal parenchymal scarring and atrophy. A few tiny right renal cysts are noted, however there is no evidence of renal mass or hydronephrosis. Stomach/Bowel: Visualized portion unremarkable. Vascular/Lymphatic: No pathologically enlarged lymph nodes identified. No abdominal aortic aneurysm. Aortic atherosclerosis. Other:  None. Musculoskeletal:  No suspicious bone lesions identified. IMPRESSION: 1. 3cm right hepatic lobe mass, suspicious for liver metastasis given history of lung  carcinoma. Primary hepatic neoplasm such as hepatocellular carcinoma is considered less likely. Consider abdomen MRI without and with contrast or PET-CT scan for further evaluation. 2. No other sites of metastatic disease identified within the abdomen. 3. Severe left renal parenchymal scarring and atrophy. Aortic Atherosclerosis (ICD10-I70.0). Electronically Signed   By: Earle Gell M.D.   On: 06/23/2018 12:45    Recent Labs: Lab Results  Component Value Date   WBC 7.3 05/27/2018   HGB 10.4 (L) 05/27/2018   PLT 225 05/27/2018   NA 142 05/27/2018   K 3.3 (L) 05/27/2018   CL 101 05/27/2018   CO2 25 05/27/2018   GLUCOSE 103 (H) 05/27/2018   BUN 21 05/27/2018   CREATININE 1.33 (H) 05/27/2018   BILITOT 0.2 05/27/2018   ALKPHOS 119 (H) 05/27/2018   AST 26 05/27/2018   ALT 18  05/27/2018   PROT 6.5 05/27/2018   ALBUMIN 3.8 05/27/2018   CALCIUM 9.5 05/27/2018   GFRAA 47 (L) 05/27/2018    Speciality Comments: No specialty comments available.  Procedures:  No procedures performed Allergies: Quinine derivatives, Sulfa antibiotics, and Percocet [oxycodone-acetaminophen]  Order DEXA to screen for osteoporosis?   Assessment / Plan:     Visit Diagnoses: Rheumatoid arthritis involving multiple sites with positive rheumatoid factor (HCC) - +RF, +CCP, +ANA, erosive, contractures: She presents today with left knee joint pain and warmth.  She has been having increased left knee joint pain for the past couple months.  She had a fall on 02/22/2018 and sustained a closed fracture of the left hip requiring operative repair by Dr. Case.  She continues to have intermittent left hip pain and radiating pain down the left lower extremity.  She has not had any recent rheumatoid arthritis flares.  She is clinically doing well on Arava 20 mg by mouth daily and Plaquenil 200 mg twice daily Monday through Friday.  She has not missed any doses recently.  She has no active synovitis at this time.  She has ulnar deviation and synovial thickening of all MCP joints.  She has very limited range of motion of bilateral wrist joints.  She will continue on this current treatment regimen.  She does not need any refills at this time.  She declined a left knee cortisone injection today.  She was advised to notify us if she develops increased joint pain or joint swelling.  She will follow-up in the office in 5 months.  High risk medication use - Arava 10 mg 2 tablets daily and Plaquenil 200 mg 2 tablets daily Monday through Friday only.  No Plaquenil eye exam on file.  Most recent CBC/CMP stable on 05/27/2018.  Due for CBC/CMP in August and will monitor every 3 months.  Standing orders are in place.  She is up-to-date with her pneumonia vaccines.    Chondrocalcinosis   Primary osteoarthritis of both hands - She  has no hand pain or joint swelling at this time.  She has incomplete fist formation.  Joint protection and muscle strengthening were discussed.  Primary osteoarthritis of left knee -She is having increased pain in the left knee joint.  She has good range of motion with some discomfort.  She has left knee warmth and inflammation.  She declined a left knee cortisone injection today.  History of total right knee replacement - Doing well.  She has good ROM with no discomfort.   Primary malignant neoplasm of left upper lobe of lung ([place]) - repeat PET in 3 months due to suspicious  lesion noted.  She has an appointment with her oncologist tomorrow.   Other medical conditions are listed as follows:  Finger clubbing   Decreased GFR   History of hypertension   History of anemia   History of gastroesophageal reflux (GERD)   Orders: No orders of the defined types were placed in this encounter.  No orders of the defined types were placed in this encounter.   Follow-Up Instructions: Return in about 5 months (around 12/14/2018) for Rheumatoid arthritis, Osteoarthritis.   Ofilia Neas, PA-C   I examined and evaluated the patient with Rose Sams PA.  Patient had no synovitis on examination although she had discomfort in her left knee joint on my examination.  I offered cortisone injection which she declined.  She wants to continue on the current regimen.  The plan of care was discussed as noted above.  Bo Merino, MD  Note - This record has been created using Editor, commissioning.  Chart creation errors have been sought, but may not always  have been located. Such creation errors do not reflect on  the standard of medical care.

## 2018-07-08 ENCOUNTER — Ambulatory Visit (HOSPITAL_COMMUNITY)
Admission: RE | Admit: 2018-07-08 | Discharge: 2018-07-08 | Disposition: A | Discharge status: Home / Self Care | Payer: Medicare Other | Source: Ambulatory Visit | Attending: Family Medicine | Admitting: Family Medicine

## 2018-07-08 ENCOUNTER — Other Ambulatory Visit: Payer: Self-pay

## 2018-07-08 DIAGNOSIS — R160 Hepatomegaly, not elsewhere classified: Secondary | ICD-10-CM | POA: Insufficient documentation

## 2018-07-08 DIAGNOSIS — C3490 Malignant neoplasm of unspecified part of unspecified bronchus or lung: Secondary | ICD-10-CM | POA: Diagnosis not present

## 2018-07-08 DIAGNOSIS — K7689 Other specified diseases of liver: Secondary | ICD-10-CM | POA: Diagnosis not present

## 2018-07-08 MED ORDER — IOHEXOL 300 MG/ML  SOLN
80.0000 mL | Freq: Once | INTRAMUSCULAR | Status: AC | PRN
  Administered 2018-07-08: 80 mL via INTRAVENOUS

## 2018-07-14 ENCOUNTER — Other Ambulatory Visit: Payer: Self-pay

## 2018-07-14 ENCOUNTER — Ambulatory Visit: Payer: Medicare Other | Admitting: Rheumatology

## 2018-07-14 ENCOUNTER — Encounter: Payer: Self-pay | Admitting: Rheumatology

## 2018-07-14 VITALS — BP 138/92 | HR 105 | Resp 17 | Ht 64.0 in | Wt 156.0 lb

## 2018-07-14 DIAGNOSIS — R683 Clubbing of fingers: Secondary | ICD-10-CM

## 2018-07-14 DIAGNOSIS — Z79899 Other long term (current) drug therapy: Secondary | ICD-10-CM

## 2018-07-14 DIAGNOSIS — M19042 Primary osteoarthritis, left hand: Secondary | ICD-10-CM

## 2018-07-14 DIAGNOSIS — C3412 Malignant neoplasm of upper lobe, left bronchus or lung: Secondary | ICD-10-CM

## 2018-07-14 DIAGNOSIS — M1120 Other chondrocalcinosis, unspecified site: Secondary | ICD-10-CM | POA: Diagnosis not present

## 2018-07-14 DIAGNOSIS — Z8679 Personal history of other diseases of the circulatory system: Secondary | ICD-10-CM

## 2018-07-14 DIAGNOSIS — R944 Abnormal results of kidney function studies: Secondary | ICD-10-CM

## 2018-07-14 DIAGNOSIS — M0579 Rheumatoid arthritis with rheumatoid factor of multiple sites without organ or systems involvement: Secondary | ICD-10-CM

## 2018-07-14 DIAGNOSIS — M19041 Primary osteoarthritis, right hand: Secondary | ICD-10-CM | POA: Diagnosis not present

## 2018-07-14 DIAGNOSIS — M1712 Unilateral primary osteoarthritis, left knee: Secondary | ICD-10-CM | POA: Diagnosis not present

## 2018-07-14 DIAGNOSIS — Z8719 Personal history of other diseases of the digestive system: Secondary | ICD-10-CM

## 2018-07-14 DIAGNOSIS — Z96651 Presence of right artificial knee joint: Secondary | ICD-10-CM

## 2018-07-14 DIAGNOSIS — Z862 Personal history of diseases of the blood and blood-forming organs and certain disorders involving the immune mechanism: Secondary | ICD-10-CM

## 2018-07-14 NOTE — Patient Instructions (Signed)
Standing Labs We placed an order today for your standing lab work.    Please come back and get your standing labs in August and every 3 months  We have open lab daily Monday through Thursday from 8:30-12:30 PM and 1:30-4:30 PM and Friday from 8:30-12:30 PM and 1:30 -4:00 PM at the office of Dr. Bo Merino.   You may experience shorter wait times on Monday and Friday afternoons. The office is located at [address] [place], [place], [place] [address] No appointment is necessary.   Labs are drawn by [facility].  You may receive a bill from [facility] for your lab work.  If you wish to have your labs drawn at another location, please call the office 24 hours in advance to send orders.  If you have any questions regarding directions or hours of operation,  please call [phone].   Just as a reminder please drink plenty of water prior to coming for your lab work. Thanks!

## 2018-07-15 DIAGNOSIS — C3491 Malignant neoplasm of unspecified part of right bronchus or lung: Secondary | ICD-10-CM | POA: Diagnosis not present

## 2018-07-15 DIAGNOSIS — M069 Rheumatoid arthritis, unspecified: Secondary | ICD-10-CM | POA: Diagnosis not present

## 2018-07-19 DIAGNOSIS — C3400 Malignant neoplasm of unspecified main bronchus: Secondary | ICD-10-CM | POA: Diagnosis not present

## 2018-08-04 ENCOUNTER — Other Ambulatory Visit: Payer: Self-pay | Admitting: *Deleted

## 2018-08-05 ENCOUNTER — Other Ambulatory Visit (HOSPITAL_COMMUNITY): Payer: Self-pay | Admitting: Hematology & Oncology

## 2018-08-05 DIAGNOSIS — C3490 Malignant neoplasm of unspecified part of unspecified bronchus or lung: Secondary | ICD-10-CM

## 2018-08-09 ENCOUNTER — Encounter (HOSPITAL_COMMUNITY)
Admission: RE | Admit: 2018-08-09 | Discharge: 2018-08-09 | Disposition: A | Discharge status: Home / Self Care | Payer: Medicare Other | Source: Ambulatory Visit | Attending: Hematology & Oncology | Admitting: Hematology & Oncology

## 2018-08-09 ENCOUNTER — Other Ambulatory Visit: Payer: Self-pay

## 2018-08-09 DIAGNOSIS — C3490 Malignant neoplasm of unspecified part of unspecified bronchus or lung: Secondary | ICD-10-CM | POA: Diagnosis not present

## 2018-08-09 MED ORDER — FLUDEOXYGLUCOSE F - 18 (FDG) INJECTION
8.5400 | Freq: Once | INTRAVENOUS | Status: AC | PRN
  Administered 2018-08-09: 8.54 via INTRAVENOUS

## 2018-08-12 ENCOUNTER — Other Ambulatory Visit: Payer: Self-pay | Admitting: Rheumatology

## 2018-08-12 DIAGNOSIS — M0579 Rheumatoid arthritis with rheumatoid factor of multiple sites without organ or systems involvement: Secondary | ICD-10-CM

## 2018-08-13 NOTE — Telephone Encounter (Addendum)
Last Visit: 07/14/18 Next Visit: 12/08/18 Labs: 05/27/18 Anemia is improving.  PLQ Eye Exam: 10/28/16 WNL  Patient advised we need PLQ eye exam. Patient states she has not schedule an appointment as of yet.She is waiting to go back to [facility] for radiation treatment for her lung cancer.   Okay to refill PLQ?

## 2018-08-16 DIAGNOSIS — J9809 Other diseases of bronchus, not elsewhere classified: Secondary | ICD-10-CM | POA: Diagnosis not present

## 2018-08-16 DIAGNOSIS — C3490 Malignant neoplasm of unspecified part of unspecified bronchus or lung: Secondary | ICD-10-CM | POA: Diagnosis not present

## 2018-08-16 NOTE — Telephone Encounter (Signed)
We will be able to refill Plaquenil only after the eye examination.

## 2018-08-25 DIAGNOSIS — R911 Solitary pulmonary nodule: Secondary | ICD-10-CM | POA: Diagnosis not present

## 2018-09-08 DIAGNOSIS — H5212 Myopia, left eye: Secondary | ICD-10-CM | POA: Diagnosis not present

## 2018-09-08 DIAGNOSIS — Z961 Presence of intraocular lens: Secondary | ICD-10-CM | POA: Diagnosis not present

## 2018-09-08 DIAGNOSIS — Z79899 Other long term (current) drug therapy: Secondary | ICD-10-CM | POA: Diagnosis not present

## 2018-09-08 DIAGNOSIS — M069 Rheumatoid arthritis, unspecified: Secondary | ICD-10-CM | POA: Diagnosis not present

## 2018-09-08 DIAGNOSIS — H524 Presbyopia: Secondary | ICD-10-CM | POA: Diagnosis not present

## 2018-09-10 DIAGNOSIS — M069 Rheumatoid arthritis, unspecified: Secondary | ICD-10-CM | POA: Diagnosis not present

## 2018-09-10 DIAGNOSIS — C3491 Malignant neoplasm of unspecified part of right bronchus or lung: Secondary | ICD-10-CM | POA: Diagnosis not present

## 2018-09-10 DIAGNOSIS — C787 Secondary malignant neoplasm of liver and intrahepatic bile duct: Secondary | ICD-10-CM | POA: Diagnosis not present

## 2018-09-16 DIAGNOSIS — C3400 Malignant neoplasm of unspecified main bronchus: Secondary | ICD-10-CM | POA: Diagnosis not present

## 2018-09-16 DIAGNOSIS — C7931 Secondary malignant neoplasm of brain: Secondary | ICD-10-CM | POA: Diagnosis not present

## 2018-09-17 ENCOUNTER — Other Ambulatory Visit: Payer: Self-pay | Admitting: Rheumatology

## 2018-09-17 ENCOUNTER — Telehealth: Payer: Self-pay | Admitting: Rheumatology

## 2018-09-17 DIAGNOSIS — M0579 Rheumatoid arthritis with rheumatoid factor of multiple sites without organ or systems involvement: Secondary | ICD-10-CM

## 2018-09-17 MED ORDER — HYDROXYCHLOROQUINE SULFATE 200 MG PO TABS: ORAL_TABLET | ORAL | 0 refills | Status: DC

## 2018-09-17 NOTE — Telephone Encounter (Signed)
Last Visit: 07/14/18 Next Visit: 12/08/18 Labs: 05/27/18 Anemia is improving Eye exam: 09/08/18 WNL   Okay to refill per Dr. Estanislado Pandy

## 2018-09-17 NOTE — Telephone Encounter (Signed)
Patient requesting a refill on Plaquenil sent to [facility]. Patient has 1 pill left. Pharmacy # [phone].

## 2018-09-20 DIAGNOSIS — Z5112 Encounter for antineoplastic immunotherapy: Secondary | ICD-10-CM | POA: Diagnosis not present

## 2018-09-20 DIAGNOSIS — Z5111 Encounter for antineoplastic chemotherapy: Secondary | ICD-10-CM | POA: Diagnosis not present

## 2018-09-20 DIAGNOSIS — C3400 Malignant neoplasm of unspecified main bronchus: Secondary | ICD-10-CM | POA: Diagnosis not present

## 2018-09-22 DIAGNOSIS — Z77090 Contact with and (suspected) exposure to asbestos: Secondary | ICD-10-CM | POA: Diagnosis not present

## 2018-09-22 DIAGNOSIS — C3400 Malignant neoplasm of unspecified main bronchus: Secondary | ICD-10-CM | POA: Diagnosis not present

## 2018-09-22 DIAGNOSIS — R000 Tachycardia, unspecified: Secondary | ICD-10-CM | POA: Diagnosis not present

## 2018-09-22 DIAGNOSIS — Z87891 Personal history of nicotine dependence: Secondary | ICD-10-CM | POA: Diagnosis not present

## 2018-09-22 DIAGNOSIS — Z886 Allergy status to analgesic agent status: Secondary | ICD-10-CM | POA: Diagnosis not present

## 2018-09-22 DIAGNOSIS — Z883 Allergy status to other anti-infective agents status: Secondary | ICD-10-CM | POA: Diagnosis not present

## 2018-09-22 DIAGNOSIS — Z7952 Long term (current) use of systemic steroids: Secondary | ICD-10-CM | POA: Diagnosis not present

## 2018-09-22 DIAGNOSIS — I10 Essential (primary) hypertension: Secondary | ICD-10-CM | POA: Diagnosis not present

## 2018-09-22 DIAGNOSIS — M069 Rheumatoid arthritis, unspecified: Secondary | ICD-10-CM | POA: Diagnosis not present

## 2018-09-22 DIAGNOSIS — D598 Other acquired hemolytic anemias: Secondary | ICD-10-CM | POA: Diagnosis not present

## 2018-09-22 DIAGNOSIS — Z79899 Other long term (current) drug therapy: Secondary | ICD-10-CM | POA: Diagnosis not present

## 2018-09-22 DIAGNOSIS — D630 Anemia in neoplastic disease: Secondary | ICD-10-CM | POA: Diagnosis not present

## 2018-09-22 DIAGNOSIS — Z882 Allergy status to sulfonamides status: Secondary | ICD-10-CM | POA: Diagnosis not present

## 2018-09-22 DIAGNOSIS — E876 Hypokalemia: Secondary | ICD-10-CM | POA: Diagnosis not present

## 2018-09-22 DIAGNOSIS — Z885 Allergy status to narcotic agent status: Secondary | ICD-10-CM | POA: Diagnosis not present

## 2018-09-22 DIAGNOSIS — K219 Gastro-esophageal reflux disease without esophagitis: Secondary | ICD-10-CM | POA: Diagnosis not present

## 2018-09-22 DIAGNOSIS — R197 Diarrhea, unspecified: Secondary | ICD-10-CM | POA: Diagnosis not present

## 2018-09-24 ENCOUNTER — Other Ambulatory Visit: Payer: Self-pay | Admitting: Rheumatology

## 2018-09-24 NOTE — Telephone Encounter (Addendum)
Last Visit: 07/14/18 Next Visit: 12/08/18 Labs: 05/27/18 anemia improving.  Patient advised she is due to update labs. Patient is in [place] for chemotherapy. Patient will have labs faxed from there.  Okay to refill 30 day supply per Dr. Estanislado Pandy

## 2018-09-28 ENCOUNTER — Telehealth: Payer: Self-pay | Admitting: Rheumatology

## 2018-09-28 NOTE — Telephone Encounter (Signed)
Patient called to let Dr. Estanislado Pandy know that the doctor's at [facility] discontinued her [place] while she is undergoing cancer treatments.

## 2018-09-29 DIAGNOSIS — G8929 Other chronic pain: Secondary | ICD-10-CM | POA: Diagnosis not present

## 2018-09-29 DIAGNOSIS — C3400 Malignant neoplasm of unspecified main bronchus: Secondary | ICD-10-CM | POA: Diagnosis not present

## 2018-09-29 DIAGNOSIS — Z883 Allergy status to other anti-infective agents status: Secondary | ICD-10-CM | POA: Diagnosis not present

## 2018-09-29 DIAGNOSIS — M069 Rheumatoid arthritis, unspecified: Secondary | ICD-10-CM | POA: Diagnosis not present

## 2018-09-29 DIAGNOSIS — I10 Essential (primary) hypertension: Secondary | ICD-10-CM | POA: Diagnosis not present

## 2018-09-29 DIAGNOSIS — Z886 Allergy status to analgesic agent status: Secondary | ICD-10-CM | POA: Diagnosis not present

## 2018-09-29 DIAGNOSIS — Z882 Allergy status to sulfonamides status: Secondary | ICD-10-CM | POA: Diagnosis not present

## 2018-09-29 DIAGNOSIS — M059 Rheumatoid arthritis with rheumatoid factor, unspecified: Secondary | ICD-10-CM | POA: Diagnosis not present

## 2018-09-29 DIAGNOSIS — M79602 Pain in left arm: Secondary | ICD-10-CM | POA: Diagnosis not present

## 2018-09-29 DIAGNOSIS — Z87891 Personal history of nicotine dependence: Secondary | ICD-10-CM | POA: Diagnosis not present

## 2018-09-29 DIAGNOSIS — K219 Gastro-esophageal reflux disease without esophagitis: Secondary | ICD-10-CM | POA: Diagnosis not present

## 2018-10-11 DIAGNOSIS — D598 Other acquired hemolytic anemias: Secondary | ICD-10-CM | POA: Diagnosis not present

## 2018-10-11 DIAGNOSIS — C3400 Malignant neoplasm of unspecified main bronchus: Secondary | ICD-10-CM | POA: Diagnosis not present

## 2018-10-11 DIAGNOSIS — N183 Chronic kidney disease, stage 3 (moderate): Secondary | ICD-10-CM | POA: Diagnosis not present

## 2018-10-11 DIAGNOSIS — Z5112 Encounter for antineoplastic immunotherapy: Secondary | ICD-10-CM | POA: Diagnosis not present

## 2018-10-11 DIAGNOSIS — Z5111 Encounter for antineoplastic chemotherapy: Secondary | ICD-10-CM | POA: Diagnosis not present

## 2018-10-13 DIAGNOSIS — C3412 Malignant neoplasm of upper lobe, left bronchus or lung: Secondary | ICD-10-CM | POA: Diagnosis not present

## 2018-11-01 DIAGNOSIS — Z902 Acquired absence of lung [part of]: Secondary | ICD-10-CM | POA: Diagnosis not present

## 2018-11-01 DIAGNOSIS — T451X5A Adverse effect of antineoplastic and immunosuppressive drugs, initial encounter: Secondary | ICD-10-CM | POA: Diagnosis not present

## 2018-11-01 DIAGNOSIS — K219 Gastro-esophageal reflux disease without esophagitis: Secondary | ICD-10-CM | POA: Diagnosis not present

## 2018-11-01 DIAGNOSIS — D61810 Antineoplastic chemotherapy induced pancytopenia: Secondary | ICD-10-CM | POA: Insufficient documentation

## 2018-11-01 DIAGNOSIS — Z79899 Other long term (current) drug therapy: Secondary | ICD-10-CM | POA: Diagnosis not present

## 2018-11-01 DIAGNOSIS — Z87891 Personal history of nicotine dependence: Secondary | ICD-10-CM | POA: Diagnosis not present

## 2018-11-01 DIAGNOSIS — M069 Rheumatoid arthritis, unspecified: Secondary | ICD-10-CM | POA: Diagnosis not present

## 2018-11-01 DIAGNOSIS — Z882 Allergy status to sulfonamides status: Secondary | ICD-10-CM | POA: Diagnosis not present

## 2018-11-01 DIAGNOSIS — Z885 Allergy status to narcotic agent status: Secondary | ICD-10-CM | POA: Diagnosis not present

## 2018-11-01 DIAGNOSIS — I10 Essential (primary) hypertension: Secondary | ICD-10-CM | POA: Diagnosis not present

## 2018-11-01 DIAGNOSIS — C3400 Malignant neoplasm of unspecified main bronchus: Secondary | ICD-10-CM | POA: Diagnosis not present

## 2018-11-01 DIAGNOSIS — E876 Hypokalemia: Secondary | ICD-10-CM | POA: Diagnosis not present

## 2018-11-01 DIAGNOSIS — M1990 Unspecified osteoarthritis, unspecified site: Secondary | ICD-10-CM | POA: Diagnosis not present

## 2018-11-04 DIAGNOSIS — D61810 Antineoplastic chemotherapy induced pancytopenia: Secondary | ICD-10-CM | POA: Diagnosis not present

## 2018-11-04 DIAGNOSIS — T451X5A Adverse effect of antineoplastic and immunosuppressive drugs, initial encounter: Secondary | ICD-10-CM | POA: Diagnosis not present

## 2018-11-04 DIAGNOSIS — C3400 Malignant neoplasm of unspecified main bronchus: Secondary | ICD-10-CM | POA: Diagnosis not present

## 2018-11-05 DIAGNOSIS — Z96651 Presence of right artificial knee joint: Secondary | ICD-10-CM | POA: Diagnosis not present

## 2018-11-05 DIAGNOSIS — J61 Pneumoconiosis due to asbestos and other mineral fibers: Secondary | ICD-10-CM | POA: Diagnosis not present

## 2018-11-05 DIAGNOSIS — D61810 Antineoplastic chemotherapy induced pancytopenia: Secondary | ICD-10-CM | POA: Diagnosis not present

## 2018-11-05 DIAGNOSIS — R0789 Other chest pain: Secondary | ICD-10-CM | POA: Diagnosis not present

## 2018-11-05 DIAGNOSIS — I129 Hypertensive chronic kidney disease with stage 1 through stage 4 chronic kidney disease, or unspecified chronic kidney disease: Secondary | ICD-10-CM | POA: Diagnosis not present

## 2018-11-05 DIAGNOSIS — M069 Rheumatoid arthritis, unspecified: Secondary | ICD-10-CM | POA: Diagnosis not present

## 2018-11-05 DIAGNOSIS — C7931 Secondary malignant neoplasm of brain: Secondary | ICD-10-CM | POA: Diagnosis not present

## 2018-11-05 DIAGNOSIS — Z7952 Long term (current) use of systemic steroids: Secondary | ICD-10-CM | POA: Diagnosis not present

## 2018-11-05 DIAGNOSIS — T451X5D Adverse effect of antineoplastic and immunosuppressive drugs, subsequent encounter: Secondary | ICD-10-CM | POA: Diagnosis not present

## 2018-11-05 DIAGNOSIS — G8929 Other chronic pain: Secondary | ICD-10-CM | POA: Diagnosis not present

## 2018-11-05 DIAGNOSIS — Z87891 Personal history of nicotine dependence: Secondary | ICD-10-CM | POA: Diagnosis not present

## 2018-11-05 DIAGNOSIS — R683 Clubbing of fingers: Secondary | ICD-10-CM | POA: Diagnosis not present

## 2018-11-05 DIAGNOSIS — Z902 Acquired absence of lung [part of]: Secondary | ICD-10-CM | POA: Diagnosis not present

## 2018-11-05 DIAGNOSIS — K219 Gastro-esophageal reflux disease without esophagitis: Secondary | ICD-10-CM | POA: Diagnosis not present

## 2018-11-05 DIAGNOSIS — C3402 Malignant neoplasm of left main bronchus: Secondary | ICD-10-CM | POA: Diagnosis not present

## 2018-11-05 DIAGNOSIS — C787 Secondary malignant neoplasm of liver and intrahepatic bile duct: Secondary | ICD-10-CM | POA: Diagnosis not present

## 2018-11-05 DIAGNOSIS — M1990 Unspecified osteoarthritis, unspecified site: Secondary | ICD-10-CM | POA: Diagnosis not present

## 2018-11-05 DIAGNOSIS — N183 Chronic kidney disease, stage 3 unspecified: Secondary | ICD-10-CM | POA: Diagnosis not present

## 2018-11-05 DIAGNOSIS — G620 Drug-induced polyneuropathy: Secondary | ICD-10-CM | POA: Diagnosis not present

## 2018-11-08 DIAGNOSIS — Z885 Allergy status to narcotic agent status: Secondary | ICD-10-CM | POA: Diagnosis not present

## 2018-11-08 DIAGNOSIS — R109 Unspecified abdominal pain: Secondary | ICD-10-CM | POA: Diagnosis not present

## 2018-11-08 DIAGNOSIS — M069 Rheumatoid arthritis, unspecified: Secondary | ICD-10-CM | POA: Diagnosis not present

## 2018-11-08 DIAGNOSIS — Z888 Allergy status to other drugs, medicaments and biological substances status: Secondary | ICD-10-CM | POA: Diagnosis not present

## 2018-11-08 DIAGNOSIS — I10 Essential (primary) hypertension: Secondary | ICD-10-CM | POA: Diagnosis not present

## 2018-11-08 DIAGNOSIS — Z882 Allergy status to sulfonamides status: Secondary | ICD-10-CM | POA: Diagnosis not present

## 2018-11-08 DIAGNOSIS — C799 Secondary malignant neoplasm of unspecified site: Secondary | ICD-10-CM | POA: Diagnosis not present

## 2018-11-08 DIAGNOSIS — C3400 Malignant neoplasm of unspecified main bronchus: Secondary | ICD-10-CM | POA: Diagnosis not present

## 2018-11-08 DIAGNOSIS — R000 Tachycardia, unspecified: Secondary | ICD-10-CM | POA: Diagnosis not present

## 2018-11-08 DIAGNOSIS — D6481 Anemia due to antineoplastic chemotherapy: Secondary | ICD-10-CM | POA: Diagnosis not present

## 2018-11-08 DIAGNOSIS — Z886 Allergy status to analgesic agent status: Secondary | ICD-10-CM | POA: Diagnosis not present

## 2018-11-08 DIAGNOSIS — Z87891 Personal history of nicotine dependence: Secondary | ICD-10-CM | POA: Diagnosis not present

## 2018-11-08 DIAGNOSIS — Z5111 Encounter for antineoplastic chemotherapy: Secondary | ICD-10-CM | POA: Diagnosis not present

## 2018-11-08 DIAGNOSIS — C3412 Malignant neoplasm of upper lobe, left bronchus or lung: Secondary | ICD-10-CM | POA: Diagnosis not present

## 2018-11-08 DIAGNOSIS — E876 Hypokalemia: Secondary | ICD-10-CM | POA: Diagnosis not present

## 2018-11-11 DIAGNOSIS — J61 Pneumoconiosis due to asbestos and other mineral fibers: Secondary | ICD-10-CM | POA: Diagnosis not present

## 2018-11-11 DIAGNOSIS — M069 Rheumatoid arthritis, unspecified: Secondary | ICD-10-CM | POA: Diagnosis not present

## 2018-11-11 DIAGNOSIS — G620 Drug-induced polyneuropathy: Secondary | ICD-10-CM | POA: Diagnosis not present

## 2018-11-11 DIAGNOSIS — I129 Hypertensive chronic kidney disease with stage 1 through stage 4 chronic kidney disease, or unspecified chronic kidney disease: Secondary | ICD-10-CM | POA: Diagnosis not present

## 2018-11-11 DIAGNOSIS — D61810 Antineoplastic chemotherapy induced pancytopenia: Secondary | ICD-10-CM | POA: Diagnosis not present

## 2018-11-11 DIAGNOSIS — Z7952 Long term (current) use of systemic steroids: Secondary | ICD-10-CM | POA: Diagnosis not present

## 2018-11-11 DIAGNOSIS — R683 Clubbing of fingers: Secondary | ICD-10-CM | POA: Diagnosis not present

## 2018-11-11 DIAGNOSIS — Z902 Acquired absence of lung [part of]: Secondary | ICD-10-CM | POA: Diagnosis not present

## 2018-11-11 DIAGNOSIS — K219 Gastro-esophageal reflux disease without esophagitis: Secondary | ICD-10-CM | POA: Diagnosis not present

## 2018-11-11 DIAGNOSIS — C3402 Malignant neoplasm of left main bronchus: Secondary | ICD-10-CM | POA: Diagnosis not present

## 2018-11-11 DIAGNOSIS — T451X5D Adverse effect of antineoplastic and immunosuppressive drugs, subsequent encounter: Secondary | ICD-10-CM | POA: Diagnosis not present

## 2018-11-11 DIAGNOSIS — C787 Secondary malignant neoplasm of liver and intrahepatic bile duct: Secondary | ICD-10-CM | POA: Diagnosis not present

## 2018-11-11 DIAGNOSIS — R0789 Other chest pain: Secondary | ICD-10-CM | POA: Diagnosis not present

## 2018-11-11 DIAGNOSIS — C7931 Secondary malignant neoplasm of brain: Secondary | ICD-10-CM | POA: Diagnosis not present

## 2018-11-11 DIAGNOSIS — Z87891 Personal history of nicotine dependence: Secondary | ICD-10-CM | POA: Diagnosis not present

## 2018-11-11 DIAGNOSIS — Z96651 Presence of right artificial knee joint: Secondary | ICD-10-CM | POA: Diagnosis not present

## 2018-11-11 DIAGNOSIS — G8929 Other chronic pain: Secondary | ICD-10-CM | POA: Diagnosis not present

## 2018-11-11 DIAGNOSIS — N183 Chronic kidney disease, stage 3 unspecified: Secondary | ICD-10-CM | POA: Diagnosis not present

## 2018-11-11 DIAGNOSIS — M1990 Unspecified osteoarthritis, unspecified site: Secondary | ICD-10-CM | POA: Diagnosis not present

## 2018-11-18 DIAGNOSIS — Z883 Allergy status to other anti-infective agents status: Secondary | ICD-10-CM | POA: Diagnosis not present

## 2018-11-18 DIAGNOSIS — Z87891 Personal history of nicotine dependence: Secondary | ICD-10-CM | POA: Diagnosis not present

## 2018-11-18 DIAGNOSIS — K219 Gastro-esophageal reflux disease without esophagitis: Secondary | ICD-10-CM | POA: Diagnosis not present

## 2018-11-18 DIAGNOSIS — I10 Essential (primary) hypertension: Secondary | ICD-10-CM | POA: Diagnosis not present

## 2018-11-18 DIAGNOSIS — M1990 Unspecified osteoarthritis, unspecified site: Secondary | ICD-10-CM | POA: Diagnosis not present

## 2018-11-18 DIAGNOSIS — Z886 Allergy status to analgesic agent status: Secondary | ICD-10-CM | POA: Diagnosis not present

## 2018-11-18 DIAGNOSIS — Z882 Allergy status to sulfonamides status: Secondary | ICD-10-CM | POA: Diagnosis not present

## 2018-11-18 DIAGNOSIS — C7931 Secondary malignant neoplasm of brain: Secondary | ICD-10-CM | POA: Diagnosis not present

## 2018-11-18 DIAGNOSIS — Z79899 Other long term (current) drug therapy: Secondary | ICD-10-CM | POA: Diagnosis not present

## 2018-11-18 DIAGNOSIS — Z9889 Other specified postprocedural states: Secondary | ICD-10-CM | POA: Diagnosis not present

## 2018-11-18 DIAGNOSIS — C3490 Malignant neoplasm of unspecified part of unspecified bronchus or lung: Secondary | ICD-10-CM | POA: Diagnosis not present

## 2018-11-18 DIAGNOSIS — Z9089 Acquired absence of other organs: Secondary | ICD-10-CM | POA: Diagnosis not present

## 2018-11-18 DIAGNOSIS — Z96651 Presence of right artificial knee joint: Secondary | ICD-10-CM | POA: Diagnosis not present

## 2018-11-29 DIAGNOSIS — C3412 Malignant neoplasm of upper lobe, left bronchus or lung: Secondary | ICD-10-CM | POA: Diagnosis not present

## 2018-11-29 DIAGNOSIS — Z882 Allergy status to sulfonamides status: Secondary | ICD-10-CM | POA: Diagnosis not present

## 2018-11-29 DIAGNOSIS — R062 Wheezing: Secondary | ICD-10-CM | POA: Diagnosis not present

## 2018-11-29 DIAGNOSIS — E876 Hypokalemia: Secondary | ICD-10-CM | POA: Diagnosis not present

## 2018-11-29 DIAGNOSIS — I10 Essential (primary) hypertension: Secondary | ICD-10-CM | POA: Diagnosis not present

## 2018-11-29 DIAGNOSIS — R0602 Shortness of breath: Secondary | ICD-10-CM | POA: Diagnosis not present

## 2018-11-29 DIAGNOSIS — Z888 Allergy status to other drugs, medicaments and biological substances status: Secondary | ICD-10-CM | POA: Diagnosis not present

## 2018-11-29 DIAGNOSIS — Z885 Allergy status to narcotic agent status: Secondary | ICD-10-CM | POA: Diagnosis not present

## 2018-11-29 DIAGNOSIS — Z803 Family history of malignant neoplasm of breast: Secondary | ICD-10-CM | POA: Diagnosis not present

## 2018-11-29 DIAGNOSIS — M069 Rheumatoid arthritis, unspecified: Secondary | ICD-10-CM | POA: Diagnosis not present

## 2018-11-29 DIAGNOSIS — C3400 Malignant neoplasm of unspecified main bronchus: Secondary | ICD-10-CM | POA: Diagnosis not present

## 2018-11-29 DIAGNOSIS — Z79891 Long term (current) use of opiate analgesic: Secondary | ICD-10-CM | POA: Diagnosis not present

## 2018-11-29 DIAGNOSIS — Z79899 Other long term (current) drug therapy: Secondary | ICD-10-CM | POA: Diagnosis not present

## 2018-11-29 DIAGNOSIS — Z7951 Long term (current) use of inhaled steroids: Secondary | ICD-10-CM | POA: Diagnosis not present

## 2018-11-29 DIAGNOSIS — Z87891 Personal history of nicotine dependence: Secondary | ICD-10-CM | POA: Diagnosis not present

## 2018-12-06 DIAGNOSIS — C3400 Malignant neoplasm of unspecified main bronchus: Secondary | ICD-10-CM | POA: Diagnosis not present

## 2018-12-08 ENCOUNTER — Ambulatory Visit: Payer: Medicare Other | Admitting: Rheumatology

## 2018-12-09 DIAGNOSIS — M069 Rheumatoid arthritis, unspecified: Secondary | ICD-10-CM | POA: Diagnosis not present

## 2018-12-09 DIAGNOSIS — C3400 Malignant neoplasm of unspecified main bronchus: Secondary | ICD-10-CM | POA: Diagnosis not present

## 2018-12-13 DIAGNOSIS — C7931 Secondary malignant neoplasm of brain: Secondary | ICD-10-CM | POA: Diagnosis not present

## 2018-12-14 ENCOUNTER — Other Ambulatory Visit: Payer: Self-pay

## 2018-12-14 NOTE — Patient Outreach (Signed)
[facility]) Care Management  12/14/2018  MAVIS GRAVELLE 09-20-1950 [id number]   Medication Adherence call to Mrs. Ronette Privott spoke with family member he explain patient is out of town,patient is past due on Losartan 25 mg.under [place].   [facility] Direct Dial [phone]  Fax [phone] [email or web address]

## 2018-12-24 DIAGNOSIS — R946 Abnormal results of thyroid function studies: Secondary | ICD-10-CM | POA: Diagnosis not present

## 2018-12-24 DIAGNOSIS — C3400 Malignant neoplasm of unspecified main bronchus: Secondary | ICD-10-CM | POA: Diagnosis not present

## 2018-12-27 DIAGNOSIS — Z888 Allergy status to other drugs, medicaments and biological substances status: Secondary | ICD-10-CM | POA: Diagnosis not present

## 2018-12-27 DIAGNOSIS — Z882 Allergy status to sulfonamides status: Secondary | ICD-10-CM | POA: Diagnosis not present

## 2018-12-27 DIAGNOSIS — C3400 Malignant neoplasm of unspecified main bronchus: Secondary | ICD-10-CM | POA: Diagnosis not present

## 2018-12-27 DIAGNOSIS — M069 Rheumatoid arthritis, unspecified: Secondary | ICD-10-CM | POA: Diagnosis not present

## 2018-12-27 DIAGNOSIS — Z885 Allergy status to narcotic agent status: Secondary | ICD-10-CM | POA: Diagnosis not present

## 2018-12-27 DIAGNOSIS — Z79899 Other long term (current) drug therapy: Secondary | ICD-10-CM | POA: Diagnosis not present

## 2018-12-27 DIAGNOSIS — I10 Essential (primary) hypertension: Secondary | ICD-10-CM | POA: Diagnosis not present

## 2018-12-27 DIAGNOSIS — D696 Thrombocytopenia, unspecified: Secondary | ICD-10-CM | POA: Diagnosis not present

## 2018-12-27 DIAGNOSIS — K219 Gastro-esophageal reflux disease without esophagitis: Secondary | ICD-10-CM | POA: Diagnosis not present

## 2018-12-27 DIAGNOSIS — G8929 Other chronic pain: Secondary | ICD-10-CM | POA: Diagnosis not present

## 2018-12-27 DIAGNOSIS — Z902 Acquired absence of lung [part of]: Secondary | ICD-10-CM | POA: Diagnosis not present

## 2018-12-27 DIAGNOSIS — Z87891 Personal history of nicotine dependence: Secondary | ICD-10-CM | POA: Diagnosis not present

## 2018-12-27 DIAGNOSIS — R7401 Elevation of levels of liver transaminase levels: Secondary | ICD-10-CM | POA: Diagnosis not present

## 2018-12-27 DIAGNOSIS — Z7951 Long term (current) use of inhaled steroids: Secondary | ICD-10-CM | POA: Diagnosis not present

## 2018-12-29 DIAGNOSIS — D61810 Antineoplastic chemotherapy induced pancytopenia: Secondary | ICD-10-CM | POA: Diagnosis not present

## 2018-12-29 DIAGNOSIS — T451X5A Adverse effect of antineoplastic and immunosuppressive drugs, initial encounter: Secondary | ICD-10-CM | POA: Diagnosis not present

## 2018-12-29 DIAGNOSIS — D598 Other acquired hemolytic anemias: Secondary | ICD-10-CM | POA: Diagnosis not present

## 2019-01-03 DIAGNOSIS — C3400 Malignant neoplasm of unspecified main bronchus: Secondary | ICD-10-CM | POA: Diagnosis not present

## 2019-01-03 DIAGNOSIS — Z5111 Encounter for antineoplastic chemotherapy: Secondary | ICD-10-CM | POA: Diagnosis not present

## 2019-01-21 HISTORY — PX: FEMUR FRACTURE SURGERY: SHX633

## 2019-02-11 DIAGNOSIS — C3400 Malignant neoplasm of unspecified main bronchus: Secondary | ICD-10-CM | POA: Diagnosis not present

## 2019-02-14 DIAGNOSIS — C787 Secondary malignant neoplasm of liver and intrahepatic bile duct: Secondary | ICD-10-CM | POA: Diagnosis not present

## 2019-02-14 DIAGNOSIS — I10 Essential (primary) hypertension: Secondary | ICD-10-CM | POA: Diagnosis not present

## 2019-02-14 DIAGNOSIS — E876 Hypokalemia: Secondary | ICD-10-CM | POA: Diagnosis not present

## 2019-02-14 DIAGNOSIS — R000 Tachycardia, unspecified: Secondary | ICD-10-CM | POA: Diagnosis not present

## 2019-02-14 DIAGNOSIS — Z882 Allergy status to sulfonamides status: Secondary | ICD-10-CM | POA: Diagnosis not present

## 2019-02-14 DIAGNOSIS — D649 Anemia, unspecified: Secondary | ICD-10-CM | POA: Diagnosis not present

## 2019-02-14 DIAGNOSIS — C3400 Malignant neoplasm of unspecified main bronchus: Secondary | ICD-10-CM | POA: Diagnosis not present

## 2019-02-14 DIAGNOSIS — Z87891 Personal history of nicotine dependence: Secondary | ICD-10-CM | POA: Diagnosis not present

## 2019-02-14 DIAGNOSIS — Z885 Allergy status to narcotic agent status: Secondary | ICD-10-CM | POA: Diagnosis not present

## 2019-02-14 DIAGNOSIS — Z902 Acquired absence of lung [part of]: Secondary | ICD-10-CM | POA: Diagnosis not present

## 2019-02-14 DIAGNOSIS — Z888 Allergy status to other drugs, medicaments and biological substances status: Secondary | ICD-10-CM | POA: Diagnosis not present

## 2019-02-14 DIAGNOSIS — M069 Rheumatoid arthritis, unspecified: Secondary | ICD-10-CM | POA: Diagnosis not present

## 2019-02-18 DIAGNOSIS — C3400 Malignant neoplasm of unspecified main bronchus: Secondary | ICD-10-CM | POA: Diagnosis not present

## 2019-02-25 DIAGNOSIS — Z888 Allergy status to other drugs, medicaments and biological substances status: Secondary | ICD-10-CM | POA: Diagnosis not present

## 2019-02-25 DIAGNOSIS — Z886 Allergy status to analgesic agent status: Secondary | ICD-10-CM | POA: Diagnosis not present

## 2019-02-25 DIAGNOSIS — M069 Rheumatoid arthritis, unspecified: Secondary | ICD-10-CM | POA: Diagnosis not present

## 2019-02-25 DIAGNOSIS — C3400 Malignant neoplasm of unspecified main bronchus: Secondary | ICD-10-CM | POA: Diagnosis not present

## 2019-02-25 DIAGNOSIS — Z87891 Personal history of nicotine dependence: Secondary | ICD-10-CM | POA: Diagnosis not present

## 2019-02-25 DIAGNOSIS — C3412 Malignant neoplasm of upper lobe, left bronchus or lung: Secondary | ICD-10-CM | POA: Diagnosis not present

## 2019-02-25 DIAGNOSIS — G8929 Other chronic pain: Secondary | ICD-10-CM | POA: Diagnosis not present

## 2019-02-25 DIAGNOSIS — I10 Essential (primary) hypertension: Secondary | ICD-10-CM | POA: Diagnosis not present

## 2019-02-25 DIAGNOSIS — Z882 Allergy status to sulfonamides status: Secondary | ICD-10-CM | POA: Diagnosis not present

## 2019-03-19 DIAGNOSIS — Z77090 Contact with and (suspected) exposure to asbestos: Secondary | ICD-10-CM | POA: Diagnosis not present

## 2019-03-19 DIAGNOSIS — K219 Gastro-esophageal reflux disease without esophagitis: Secondary | ICD-10-CM | POA: Diagnosis not present

## 2019-03-19 DIAGNOSIS — C779 Secondary and unspecified malignant neoplasm of lymph node, unspecified: Secondary | ICD-10-CM | POA: Diagnosis not present

## 2019-03-19 DIAGNOSIS — I951 Orthostatic hypotension: Secondary | ICD-10-CM | POA: Diagnosis not present

## 2019-03-19 DIAGNOSIS — M069 Rheumatoid arthritis, unspecified: Secondary | ICD-10-CM | POA: Diagnosis not present

## 2019-03-19 DIAGNOSIS — Z96651 Presence of right artificial knee joint: Secondary | ICD-10-CM | POA: Diagnosis not present

## 2019-03-19 DIAGNOSIS — I129 Hypertensive chronic kidney disease with stage 1 through stage 4 chronic kidney disease, or unspecified chronic kidney disease: Secondary | ICD-10-CM | POA: Diagnosis not present

## 2019-03-19 DIAGNOSIS — M2550 Pain in unspecified joint: Secondary | ICD-10-CM | POA: Diagnosis not present

## 2019-03-19 DIAGNOSIS — C782 Secondary malignant neoplasm of pleura: Secondary | ICD-10-CM | POA: Diagnosis not present

## 2019-03-19 DIAGNOSIS — M810 Age-related osteoporosis without current pathological fracture: Secondary | ICD-10-CM | POA: Diagnosis not present

## 2019-03-19 DIAGNOSIS — Z885 Allergy status to narcotic agent status: Secondary | ICD-10-CM | POA: Diagnosis not present

## 2019-03-19 DIAGNOSIS — S3282XA Multiple fractures of pelvis without disruption of pelvic ring, initial encounter for closed fracture: Secondary | ICD-10-CM | POA: Diagnosis not present

## 2019-03-19 DIAGNOSIS — I517 Cardiomegaly: Secondary | ICD-10-CM | POA: Diagnosis not present

## 2019-03-19 DIAGNOSIS — D649 Anemia, unspecified: Secondary | ICD-10-CM | POA: Diagnosis not present

## 2019-03-19 DIAGNOSIS — Z85118 Personal history of other malignant neoplasm of bronchus and lung: Secondary | ICD-10-CM | POA: Diagnosis not present

## 2019-03-19 DIAGNOSIS — C7931 Secondary malignant neoplasm of brain: Secondary | ICD-10-CM | POA: Diagnosis not present

## 2019-03-19 DIAGNOSIS — S32592A Other specified fracture of left pubis, initial encounter for closed fracture: Secondary | ICD-10-CM | POA: Diagnosis not present

## 2019-03-19 DIAGNOSIS — Z20822 Contact with and (suspected) exposure to covid-19: Secondary | ICD-10-CM | POA: Diagnosis not present

## 2019-03-19 DIAGNOSIS — S32512A Fracture of superior rim of left pubis, initial encounter for closed fracture: Secondary | ICD-10-CM | POA: Diagnosis not present

## 2019-03-19 DIAGNOSIS — I10 Essential (primary) hypertension: Secondary | ICD-10-CM | POA: Diagnosis not present

## 2019-03-19 DIAGNOSIS — Z902 Acquired absence of lung [part of]: Secondary | ICD-10-CM | POA: Diagnosis not present

## 2019-03-19 DIAGNOSIS — W19XXXA Unspecified fall, initial encounter: Secondary | ICD-10-CM | POA: Diagnosis not present

## 2019-03-19 DIAGNOSIS — J90 Pleural effusion, not elsewhere classified: Secondary | ICD-10-CM | POA: Diagnosis not present

## 2019-03-19 DIAGNOSIS — M1712 Unilateral primary osteoarthritis, left knee: Secondary | ICD-10-CM | POA: Diagnosis not present

## 2019-03-19 DIAGNOSIS — Z87891 Personal history of nicotine dependence: Secondary | ICD-10-CM | POA: Diagnosis not present

## 2019-03-19 DIAGNOSIS — S79822A Other specified injuries of left thigh, initial encounter: Secondary | ICD-10-CM | POA: Diagnosis not present

## 2019-03-19 DIAGNOSIS — S32502A Unspecified fracture of left pubis, initial encounter for closed fracture: Secondary | ICD-10-CM | POA: Diagnosis not present

## 2019-03-19 DIAGNOSIS — M542 Cervicalgia: Secondary | ICD-10-CM | POA: Diagnosis not present

## 2019-03-19 DIAGNOSIS — N179 Acute kidney failure, unspecified: Secondary | ICD-10-CM | POA: Diagnosis not present

## 2019-03-19 DIAGNOSIS — Z9221 Personal history of antineoplastic chemotherapy: Secondary | ICD-10-CM | POA: Diagnosis not present

## 2019-03-19 DIAGNOSIS — C3400 Malignant neoplasm of unspecified main bronchus: Secondary | ICD-10-CM | POA: Diagnosis not present

## 2019-03-19 DIAGNOSIS — Z7401 Bed confinement status: Secondary | ICD-10-CM | POA: Diagnosis not present

## 2019-03-19 DIAGNOSIS — J9811 Atelectasis: Secondary | ICD-10-CM | POA: Diagnosis not present

## 2019-03-19 DIAGNOSIS — J910 Malignant pleural effusion: Secondary | ICD-10-CM | POA: Diagnosis not present

## 2019-03-19 DIAGNOSIS — N183 Chronic kidney disease, stage 3 unspecified: Secondary | ICD-10-CM | POA: Diagnosis not present

## 2019-03-19 DIAGNOSIS — C787 Secondary malignant neoplasm of liver and intrahepatic bile duct: Secondary | ICD-10-CM | POA: Diagnosis not present

## 2019-03-19 DIAGNOSIS — W010XXA Fall on same level from slipping, tripping and stumbling without subsequent striking against object, initial encounter: Secondary | ICD-10-CM | POA: Diagnosis not present

## 2019-03-20 DIAGNOSIS — S32512A Fracture of superior rim of left pubis, initial encounter for closed fracture: Secondary | ICD-10-CM | POA: Insufficient documentation

## 2019-03-30 DIAGNOSIS — S32512D Fracture of superior rim of left pubis, subsequent encounter for fracture with routine healing: Secondary | ICD-10-CM | POA: Diagnosis not present

## 2019-03-31 DIAGNOSIS — S32512D Fracture of superior rim of left pubis, subsequent encounter for fracture with routine healing: Secondary | ICD-10-CM | POA: Diagnosis not present

## 2019-04-04 DIAGNOSIS — S32512D Fracture of superior rim of left pubis, subsequent encounter for fracture with routine healing: Secondary | ICD-10-CM | POA: Diagnosis not present

## 2019-04-05 DIAGNOSIS — S32512D Fracture of superior rim of left pubis, subsequent encounter for fracture with routine healing: Secondary | ICD-10-CM | POA: Diagnosis not present

## 2019-04-06 DIAGNOSIS — S32512D Fracture of superior rim of left pubis, subsequent encounter for fracture with routine healing: Secondary | ICD-10-CM | POA: Diagnosis not present

## 2019-04-12 DIAGNOSIS — S32512D Fracture of superior rim of left pubis, subsequent encounter for fracture with routine healing: Secondary | ICD-10-CM | POA: Diagnosis not present

## 2019-04-13 DIAGNOSIS — S32512D Fracture of superior rim of left pubis, subsequent encounter for fracture with routine healing: Secondary | ICD-10-CM | POA: Diagnosis not present

## 2019-04-14 DIAGNOSIS — S32512D Fracture of superior rim of left pubis, subsequent encounter for fracture with routine healing: Secondary | ICD-10-CM | POA: Diagnosis not present

## 2019-04-19 DIAGNOSIS — S32512D Fracture of superior rim of left pubis, subsequent encounter for fracture with routine healing: Secondary | ICD-10-CM | POA: Diagnosis not present

## 2019-04-20 DIAGNOSIS — S32512D Fracture of superior rim of left pubis, subsequent encounter for fracture with routine healing: Secondary | ICD-10-CM | POA: Diagnosis not present

## 2019-04-25 DIAGNOSIS — C3412 Malignant neoplasm of upper lobe, left bronchus or lung: Secondary | ICD-10-CM | POA: Diagnosis not present

## 2019-04-25 DIAGNOSIS — C3400 Malignant neoplasm of unspecified main bronchus: Secondary | ICD-10-CM | POA: Diagnosis not present

## 2019-04-25 DIAGNOSIS — S32512D Fracture of superior rim of left pubis, subsequent encounter for fracture with routine healing: Secondary | ICD-10-CM | POA: Diagnosis not present

## 2019-04-27 DIAGNOSIS — S32512D Fracture of superior rim of left pubis, subsequent encounter for fracture with routine healing: Secondary | ICD-10-CM | POA: Diagnosis not present

## 2019-05-02 DIAGNOSIS — S32512D Fracture of superior rim of left pubis, subsequent encounter for fracture with routine healing: Secondary | ICD-10-CM | POA: Diagnosis not present

## 2019-05-04 DIAGNOSIS — S32512D Fracture of superior rim of left pubis, subsequent encounter for fracture with routine healing: Secondary | ICD-10-CM | POA: Diagnosis not present

## 2019-05-10 DIAGNOSIS — S32512D Fracture of superior rim of left pubis, subsequent encounter for fracture with routine healing: Secondary | ICD-10-CM | POA: Diagnosis not present

## 2019-05-12 DIAGNOSIS — C3400 Malignant neoplasm of unspecified main bronchus: Secondary | ICD-10-CM | POA: Diagnosis not present

## 2019-05-12 DIAGNOSIS — M069 Rheumatoid arthritis, unspecified: Secondary | ICD-10-CM | POA: Diagnosis not present

## 2019-05-13 DIAGNOSIS — S32512D Fracture of superior rim of left pubis, subsequent encounter for fracture with routine healing: Secondary | ICD-10-CM | POA: Diagnosis not present

## 2019-05-17 DIAGNOSIS — S32512D Fracture of superior rim of left pubis, subsequent encounter for fracture with routine healing: Secondary | ICD-10-CM | POA: Diagnosis not present

## 2019-05-18 DIAGNOSIS — S32512D Fracture of superior rim of left pubis, subsequent encounter for fracture with routine healing: Secondary | ICD-10-CM | POA: Diagnosis not present

## 2019-05-27 DIAGNOSIS — M069 Rheumatoid arthritis, unspecified: Secondary | ICD-10-CM | POA: Diagnosis not present

## 2019-06-03 DIAGNOSIS — C3400 Malignant neoplasm of unspecified main bronchus: Secondary | ICD-10-CM | POA: Diagnosis not present

## 2019-06-03 DIAGNOSIS — C3492 Malignant neoplasm of unspecified part of left bronchus or lung: Secondary | ICD-10-CM | POA: Diagnosis not present

## 2019-06-03 DIAGNOSIS — R932 Abnormal findings on diagnostic imaging of liver and biliary tract: Secondary | ICD-10-CM | POA: Diagnosis not present

## 2019-06-03 DIAGNOSIS — R5383 Other fatigue: Secondary | ICD-10-CM | POA: Diagnosis not present

## 2019-06-06 DIAGNOSIS — C7951 Secondary malignant neoplasm of bone: Secondary | ICD-10-CM | POA: Diagnosis not present

## 2019-06-06 DIAGNOSIS — C3412 Malignant neoplasm of upper lobe, left bronchus or lung: Secondary | ICD-10-CM | POA: Diagnosis not present

## 2019-06-06 DIAGNOSIS — I10 Essential (primary) hypertension: Secondary | ICD-10-CM | POA: Diagnosis not present

## 2019-06-06 DIAGNOSIS — M069 Rheumatoid arthritis, unspecified: Secondary | ICD-10-CM | POA: Diagnosis not present

## 2019-06-06 DIAGNOSIS — C3400 Malignant neoplasm of unspecified main bronchus: Secondary | ICD-10-CM | POA: Diagnosis not present

## 2019-06-06 DIAGNOSIS — Z882 Allergy status to sulfonamides status: Secondary | ICD-10-CM | POA: Diagnosis not present

## 2019-06-06 DIAGNOSIS — Z888 Allergy status to other drugs, medicaments and biological substances status: Secondary | ICD-10-CM | POA: Diagnosis not present

## 2019-06-06 DIAGNOSIS — Z886 Allergy status to analgesic agent status: Secondary | ICD-10-CM | POA: Diagnosis not present

## 2019-06-06 DIAGNOSIS — Z87891 Personal history of nicotine dependence: Secondary | ICD-10-CM | POA: Diagnosis not present

## 2019-06-07 DIAGNOSIS — C7931 Secondary malignant neoplasm of brain: Secondary | ICD-10-CM | POA: Diagnosis not present

## 2019-06-08 DIAGNOSIS — C7931 Secondary malignant neoplasm of brain: Secondary | ICD-10-CM | POA: Diagnosis not present

## 2019-06-10 DIAGNOSIS — M069 Rheumatoid arthritis, unspecified: Secondary | ICD-10-CM | POA: Diagnosis not present

## 2019-06-15 DIAGNOSIS — C7931 Secondary malignant neoplasm of brain: Secondary | ICD-10-CM | POA: Diagnosis not present

## 2019-06-22 DIAGNOSIS — C7931 Secondary malignant neoplasm of brain: Secondary | ICD-10-CM | POA: Diagnosis not present

## 2019-06-22 DIAGNOSIS — Z01812 Encounter for preprocedural laboratory examination: Secondary | ICD-10-CM | POA: Diagnosis not present

## 2019-06-23 DIAGNOSIS — C3400 Malignant neoplasm of unspecified main bronchus: Secondary | ICD-10-CM | POA: Diagnosis not present

## 2019-06-27 DIAGNOSIS — C3400 Malignant neoplasm of unspecified main bronchus: Secondary | ICD-10-CM | POA: Diagnosis not present

## 2019-06-27 DIAGNOSIS — Z87891 Personal history of nicotine dependence: Secondary | ICD-10-CM | POA: Diagnosis not present

## 2019-06-27 DIAGNOSIS — R0602 Shortness of breath: Secondary | ICD-10-CM | POA: Diagnosis not present

## 2019-06-27 DIAGNOSIS — Z885 Allergy status to narcotic agent status: Secondary | ICD-10-CM | POA: Diagnosis not present

## 2019-06-27 DIAGNOSIS — Z882 Allergy status to sulfonamides status: Secondary | ICD-10-CM | POA: Diagnosis not present

## 2019-06-27 DIAGNOSIS — R5383 Other fatigue: Secondary | ICD-10-CM | POA: Diagnosis not present

## 2019-06-27 DIAGNOSIS — M069 Rheumatoid arthritis, unspecified: Secondary | ICD-10-CM | POA: Diagnosis not present

## 2019-06-27 DIAGNOSIS — I10 Essential (primary) hypertension: Secondary | ICD-10-CM | POA: Diagnosis not present

## 2019-06-27 DIAGNOSIS — C7951 Secondary malignant neoplasm of bone: Secondary | ICD-10-CM | POA: Diagnosis not present

## 2019-06-27 DIAGNOSIS — Z889 Allergy status to unspecified drugs, medicaments and biological substances status: Secondary | ICD-10-CM | POA: Diagnosis not present

## 2019-06-27 DIAGNOSIS — Z79899 Other long term (current) drug therapy: Secondary | ICD-10-CM | POA: Diagnosis not present

## 2019-06-27 DIAGNOSIS — M1990 Unspecified osteoarthritis, unspecified site: Secondary | ICD-10-CM | POA: Diagnosis not present

## 2019-06-27 DIAGNOSIS — C3412 Malignant neoplasm of upper lobe, left bronchus or lung: Secondary | ICD-10-CM | POA: Diagnosis not present

## 2019-06-27 DIAGNOSIS — Z800 Family history of malignant neoplasm of digestive organs: Secondary | ICD-10-CM | POA: Diagnosis not present

## 2019-06-27 DIAGNOSIS — G8929 Other chronic pain: Secondary | ICD-10-CM | POA: Diagnosis not present

## 2019-06-27 DIAGNOSIS — Z5111 Encounter for antineoplastic chemotherapy: Secondary | ICD-10-CM | POA: Diagnosis not present

## 2019-06-27 DIAGNOSIS — Z902 Acquired absence of lung [part of]: Secondary | ICD-10-CM | POA: Diagnosis not present

## 2019-06-27 DIAGNOSIS — Z803 Family history of malignant neoplasm of breast: Secondary | ICD-10-CM | POA: Diagnosis not present

## 2019-06-27 DIAGNOSIS — C7931 Secondary malignant neoplasm of brain: Secondary | ICD-10-CM | POA: Diagnosis not present

## 2019-06-27 DIAGNOSIS — R531 Weakness: Secondary | ICD-10-CM | POA: Diagnosis not present

## 2019-06-30 DIAGNOSIS — Z85118 Personal history of other malignant neoplasm of bronchus and lung: Secondary | ICD-10-CM | POA: Diagnosis not present

## 2019-06-30 DIAGNOSIS — C7931 Secondary malignant neoplasm of brain: Secondary | ICD-10-CM | POA: Diagnosis not present

## 2019-07-01 DIAGNOSIS — C7931 Secondary malignant neoplasm of brain: Secondary | ICD-10-CM | POA: Diagnosis not present

## 2019-07-04 DIAGNOSIS — C7931 Secondary malignant neoplasm of brain: Secondary | ICD-10-CM | POA: Diagnosis not present

## 2019-07-05 DIAGNOSIS — Z85118 Personal history of other malignant neoplasm of bronchus and lung: Secondary | ICD-10-CM | POA: Diagnosis not present

## 2019-07-05 DIAGNOSIS — C7931 Secondary malignant neoplasm of brain: Secondary | ICD-10-CM | POA: Diagnosis not present

## 2019-07-08 DIAGNOSIS — M069 Rheumatoid arthritis, unspecified: Secondary | ICD-10-CM | POA: Diagnosis not present

## 2019-07-15 DIAGNOSIS — C3400 Malignant neoplasm of unspecified main bronchus: Secondary | ICD-10-CM | POA: Diagnosis not present

## 2019-07-18 DIAGNOSIS — Z882 Allergy status to sulfonamides status: Secondary | ICD-10-CM | POA: Diagnosis not present

## 2019-07-18 DIAGNOSIS — C3400 Malignant neoplasm of unspecified main bronchus: Secondary | ICD-10-CM | POA: Diagnosis not present

## 2019-07-18 DIAGNOSIS — Z888 Allergy status to other drugs, medicaments and biological substances status: Secondary | ICD-10-CM | POA: Diagnosis not present

## 2019-07-18 DIAGNOSIS — J61 Pneumoconiosis due to asbestos and other mineral fibers: Secondary | ICD-10-CM | POA: Diagnosis not present

## 2019-07-18 DIAGNOSIS — I10 Essential (primary) hypertension: Secondary | ICD-10-CM | POA: Diagnosis not present

## 2019-07-18 DIAGNOSIS — C7951 Secondary malignant neoplasm of bone: Secondary | ICD-10-CM | POA: Diagnosis not present

## 2019-07-18 DIAGNOSIS — Z885 Allergy status to narcotic agent status: Secondary | ICD-10-CM | POA: Diagnosis not present

## 2019-07-18 DIAGNOSIS — D649 Anemia, unspecified: Secondary | ICD-10-CM | POA: Diagnosis not present

## 2019-07-18 DIAGNOSIS — M069 Rheumatoid arthritis, unspecified: Secondary | ICD-10-CM | POA: Diagnosis not present

## 2019-07-18 DIAGNOSIS — Z87891 Personal history of nicotine dependence: Secondary | ICD-10-CM | POA: Diagnosis not present

## 2019-07-18 DIAGNOSIS — E876 Hypokalemia: Secondary | ICD-10-CM | POA: Diagnosis not present

## 2019-07-18 DIAGNOSIS — Z5111 Encounter for antineoplastic chemotherapy: Secondary | ICD-10-CM | POA: Diagnosis not present

## 2019-08-12 DIAGNOSIS — G894 Chronic pain syndrome: Secondary | ICD-10-CM | POA: Diagnosis not present

## 2019-08-12 DIAGNOSIS — M069 Rheumatoid arthritis, unspecified: Secondary | ICD-10-CM | POA: Diagnosis not present

## 2019-08-12 DIAGNOSIS — Z0000 Encounter for general adult medical examination without abnormal findings: Secondary | ICD-10-CM | POA: Diagnosis not present

## 2019-08-12 DIAGNOSIS — I10 Essential (primary) hypertension: Secondary | ICD-10-CM | POA: Diagnosis not present

## 2019-08-12 DIAGNOSIS — J449 Chronic obstructive pulmonary disease, unspecified: Secondary | ICD-10-CM | POA: Diagnosis not present

## 2019-08-16 ENCOUNTER — Telehealth: Payer: Self-pay | Admitting: Internal Medicine

## 2019-08-16 NOTE — Telephone Encounter (Signed)
Spoke with patient regarding Palliative services and answered all her questions and she was in agreement with scheduling a visit.  I have scheduled an In-person Consult for 08/25/19 @ 3:30 PM.

## 2019-08-18 ENCOUNTER — Other Ambulatory Visit (HOSPITAL_COMMUNITY): Payer: Self-pay

## 2019-08-18 ENCOUNTER — Encounter (HOSPITAL_COMMUNITY): Payer: Self-pay

## 2019-08-18 ENCOUNTER — Other Ambulatory Visit: Payer: Self-pay

## 2019-08-18 NOTE — Progress Notes (Signed)
I made an introductory phone call to this patient prior to her initial visit with [name]. I briefly explained what the patient can expect during her visit tomorrow. I introduced myself and explained my role in the patient care. Patient expresses concern that she is currently wheelchair bound. She states that she currently has reliable support in place for transportation. I provided my contact information and encouraged the patient to call with any questions. I plan to meet with the patient tomorrow during her initial visit.

## 2019-08-19 ENCOUNTER — Inpatient Hospital Stay (HOSPITAL_COMMUNITY): Payer: Medicare Other | Attending: Hematology | Admitting: Hematology

## 2019-08-19 ENCOUNTER — Inpatient Hospital Stay (HOSPITAL_COMMUNITY): Payer: Medicare Other

## 2019-08-19 DIAGNOSIS — C7931 Secondary malignant neoplasm of brain: Secondary | ICD-10-CM | POA: Diagnosis not present

## 2019-08-19 DIAGNOSIS — C3432 Malignant neoplasm of lower lobe, left bronchus or lung: Secondary | ICD-10-CM

## 2019-08-19 DIAGNOSIS — C3412 Malignant neoplasm of upper lobe, left bronchus or lung: Secondary | ICD-10-CM | POA: Diagnosis not present

## 2019-08-19 DIAGNOSIS — C3490 Malignant neoplasm of unspecified part of unspecified bronchus or lung: Secondary | ICD-10-CM

## 2019-08-19 DIAGNOSIS — Z808 Family history of malignant neoplasm of other organs or systems: Secondary | ICD-10-CM

## 2019-08-19 DIAGNOSIS — C787 Secondary malignant neoplasm of liver and intrahepatic bile duct: Secondary | ICD-10-CM

## 2019-08-19 DIAGNOSIS — Z87891 Personal history of nicotine dependence: Secondary | ICD-10-CM | POA: Diagnosis not present

## 2019-08-19 DIAGNOSIS — Z803 Family history of malignant neoplasm of breast: Secondary | ICD-10-CM | POA: Diagnosis not present

## 2019-08-19 DIAGNOSIS — Z800 Family history of malignant neoplasm of digestive organs: Secondary | ICD-10-CM

## 2019-08-19 DIAGNOSIS — M069 Rheumatoid arthritis, unspecified: Secondary | ICD-10-CM

## 2019-08-19 LAB — COMPREHENSIVE METABOLIC PANEL
ALT: 27 U/L (ref 0–44)
AST: 47 U/L — ABNORMAL HIGH (ref 15–41)
Albumin: 2.9 g/dL — ABNORMAL LOW (ref 3.5–5.0)
Alkaline Phosphatase: 75 U/L (ref 38–126)
Anion gap: 10 (ref 5–15)
BUN: 19 mg/dL (ref 8–23)
CO2: 25 mmol/L (ref 22–32)
Calcium: 9.1 mg/dL (ref 8.9–10.3)
Chloride: 104 mmol/L (ref 98–111)
Creatinine, Ser: 1.18 mg/dL — ABNORMAL HIGH (ref 0.44–1.00)
GFR calc Af Amer: 54 mL/min — ABNORMAL LOW (ref >60)
GFR calc non Af Amer: 47 mL/min — ABNORMAL LOW (ref >60)
Glucose, Bld: 95 mg/dL (ref 70–99)
Potassium: 4.6 mmol/L (ref 3.5–5.1)
Sodium: 139 mmol/L (ref 135–145)
Total Bilirubin: 0.5 mg/dL (ref 0.3–1.2)
Total Protein: 7 g/dL (ref 6.5–8.1)

## 2019-08-19 LAB — CBC WITH DIFFERENTIAL/PLATELET
Abs Immature Granulocytes: 0.06 10*3/uL (ref 0.00–0.07)
Basophils Absolute: 0.1 10*3/uL (ref 0.0–0.1)
Basophils Relative: 1 %
Eosinophils Absolute: 0.1 10*3/uL (ref 0.0–0.5)
Eosinophils Relative: 1 %
HCT: 32.5 % — ABNORMAL LOW (ref 36.0–46.0)
Hemoglobin: 9.7 g/dL — ABNORMAL LOW (ref 12.0–15.0)
Immature Granulocytes: 1 %
Lymphocytes Relative: 33 %
Lymphs Abs: 3.2 10*3/uL (ref 0.7–4.0)
MCH: 31.8 pg (ref 26.0–34.0)
MCHC: 29.8 g/dL — ABNORMAL LOW (ref 30.0–36.0)
MCV: 106.6 fL — ABNORMAL HIGH (ref 80.0–100.0)
Monocytes Absolute: 1.1 10*3/uL — ABNORMAL HIGH (ref 0.1–1.0)
Monocytes Relative: 12 %
Neutro Abs: 5 10*3/uL (ref 1.7–7.7)
Neutrophils Relative %: 52 %
Platelets: 279 10*3/uL (ref 150–400)
RBC: 3.05 MIL/uL — ABNORMAL LOW (ref 3.87–5.11)
RDW: 17.9 % — ABNORMAL HIGH (ref 11.5–15.5)
WBC: 9.5 10*3/uL (ref 4.0–10.5)
nRBC: 0 % (ref 0.0–0.2)

## 2019-08-19 NOTE — Progress Notes (Signed)
[facility] [address]  [address]   CLINIC:  Medical Oncology/Hematology  CONSULT NOTE  Patient Care Team: Scherrie Bateman as PCP - General (Family Medicine) Dishmon, Garwin Brothers, RN as Oncology Nurse Navigator (Oncology) Donetta Potts, RN as Oncology Nurse Navigator (Oncology)  CHIEF COMPLAINTS/PURPOSE OF CONSULTATION:  Evaluation of metastatic bilateral non-small cell lung cancer to liver and brain   HISTORY OF PRESENTING ILLNESS:  [name] 69 y.o. female is here because of evaluation of metastatic bilateral non-small cell lung cancer to liver and brain, at the request of Dr. Redmond School from [facility]. She was diagnosed with stage IIA lung adenocarcinoma in late 2018 and underwent 4 cycles of cisplatin and carboplatin followed by LUL lobectomy in 06/2017 at [facility] in [place]. She was doing well and followed surveillance imaging was was found to have disease recurrence in 06/2018 with new sites of metastatic disease. Her last chemo treatment was supposed to be on 08/08/2019.  Today she reports that she is tolerating the pemetrexed well, with the rare episode of nausea and decreased appetite lasting about 2-3 days. Her arthritis is under better control and is planning on continuing taking remicaid. She ambulates with a walker since she gets dizzy while she stands and tries to do dishes and laundry, but will get SOB if she prolongs her activity; she needs assistance with her showers and her husband prepares most of her meals. She is able to go to the bathroom mostly on her own. Her appetite is okay at the moment. She has some blurry vision, but denies double vision or headaches.  Her maternal GM had colon cancer; mother had breast cancer; eldest son deceased from pancreatic cancer. She used to work in [employer] and has exposure to asbestos.  MEDICAL HISTORY:  Past Medical History:  Diagnosis  Date  . Chronic pain   . Depression   . Essential hypertension   . GERD (gastroesophageal reflux disease)   . History of lung cancer 06/2017  . Osteoarthritis   . Pulmonary asbestosis ([place])   . Rheumatoid arthritis ([place])     SURGICAL HISTORY: Past Surgical History:  Procedure Laterality Date  . ABDOMINAL HYSTERECTOMY    . BILATERAL OOPHORECTOMY    . CESAREAN SECTION     x3  . FEMUR FRACTURE SURGERY    . KNEE ARTHROPLASTY Right   . LUNG CANCER SURGERY Left 07/08/2017  . pelvis fracture/surgery     Dr. Cheri Rous  . TONSILLECTOMY    . VIDEO BRONCHOSCOPY WITH ENDOBRONCHIAL NAVIGATION Left 12/31/2016   Procedure: VIDEO BRONCHOSCOPY WITH ENDOBRONCHIAL NAVIGATION, left upper lung lobe;  Surgeon: Collene Gobble, MD;  Location: [facility];  Service: Thoracic;  Laterality: Left;  . YAG LASER APPLICATION Right 65/78/4696   Procedure: YAG LASER APPLICATION;  Surgeon: Rutherford Guys, MD;  Location: [facility];  Service: Ophthalmology;  Laterality: Right;  . YAG LASER APPLICATION Left 29/52/8413   Procedure: YAG LASER APPLICATION;  Surgeon: Rutherford Guys, MD;  Location: [facility];  Service: Ophthalmology;  Laterality: Left;    SOCIAL HISTORY: Social History   Socioeconomic History  . Marital status: Married    Spouse name: Not on file  . Number of children: 3  . Years of education: Not on file  . Highest education level: Not on file  Occupational History  . Occupation: DISABLED  Tobacco Use  . Smoking status: Former Smoker    Packs/day: 1.25  Years: 38.00    Pack years: 47.50    Types: Cigarettes    Start date: 01/21/1968    Quit date: 01/20/2006    Years since quitting: 13.5  . Smokeless tobacco: Never Used  Vaping Use  . Vaping Use: Never used  Substance and Sexual Activity  . Alcohol use: No    Alcohol/week: 0.0 standard drinks  . Drug use: No  . Sexual activity: Not Currently  Other Topics Concern  . Not on file  Social History Narrative  . Not on file   Social Determinants of  Health   Financial Resource Strain: Medium Risk  . Difficulty of Paying Living Expenses: Somewhat hard  Food Insecurity: No Food Insecurity  . Worried About Charity fundraiser in the Last Year: Never true  . Ran Out of Food in the Last Year: Never true  Transportation Needs: No Transportation Needs  . Lack of Transportation (Medical): No  . Lack of Transportation (Non-Medical): No  Physical Activity: Inactive  . Days of Exercise per Week: 0 days  . Minutes of Exercise per Session: 0 min  Stress: Stress Concern Present  . Feeling of Stress : To some extent  Social Connections: Moderately Isolated  . Frequency of Communication with Friends and Family: Twice a week  . Frequency of Social Gatherings with Friends and Family: Never  . Attends Religious Services: 1 to 4 times per year  . Active Member of Clubs or Organizations: No  . Attends Archivist Meetings: Never  . Marital Status: Married  Human resources officer Violence: Not At Risk  . Fear of Current or Ex-Partner: No  . Emotionally Abused: No  . Physically Abused: No  . Sexually Abused: No    FAMILY HISTORY: Family History  Problem Relation Age of Onset  . Breast cancer Mother   . Colon cancer Maternal Grandmother   . Pancreatic cancer Son   . Healthy Father   . Arthritis Sister   . Arthritis Paternal Grandmother     ALLERGIES:  is allergic to quinine derivatives, sulfa antibiotics, and percocet [oxycodone-acetaminophen].  MEDICATIONS:  Current Outpatient Medications  Medication Sig Dispense Refill  . albuterol (VENTOLIN HFA) 108 (90 Base) MCG/ACT inhaler Inhale into the lungs. (Patient not taking: Reported on 08/18/2019)    . alendronate (FOSAMAX) 35 MG tablet Take by mouth.    Marland Kitchen amitriptyline (ELAVIL) 100 MG tablet Take 100 mg by mouth at bedtime.    . Ascorbic Acid (VITAMIN C) 250 MG CHEW Chew 1 tablet by mouth daily.    . cetirizine (ZYRTEC) 10 MG tablet Take 10 mg by mouth at bedtime.    . diclofenac  Sodium (VOLTAREN) 1 % GEL Apply topically.    . diphenhydrAMINE (BENADRYL) 25 mg capsule Take 50-100 mg by mouth every 6 (six) hours as needed for itching or allergies. (Patient not taking: Reported on 08/18/2019)    . folic acid (FOLVITE) 1 MG tablet Take 1 tablet (1 mg total) by mouth daily. 90 tablet 4  . gabapentin (NEURONTIN) 100 MG capsule TK 1 C PO TID  5  . hydrALAZINE (APRESOLINE) 25 MG tablet Take 25 mg by mouth 3 (three) times daily.    Marland Kitchen HYDROcodone-acetaminophen (NORCO) 10-325 MG tablet Take 1 tablet by mouth every 6 (six) hours as needed for moderate pain.  (Patient not taking: Reported on 08/18/2019)    . hydroxychloroquine (PLAQUENIL) 200 MG tablet TAKE 2 TABLETS BY MOUTH  EVERY EVENING. MONDAY  THROUGH FRIDAY 120 tablet 0  .  leflunomide (ARAVA) 10 MG tablet TAKE 2 TABLETS BY MOUTH  DAILY 60 tablet 0  . losartan-hydrochlorothiazide (HYZAAR) 50-12.5 MG tablet Take 1 tablet by mouth daily.    . Multiple Vitamin (MULTIVITAMIN WITH MINERALS) TABS tablet Take 1 tablet by mouth daily.    Marland Kitchen omeprazole (PRILOSEC) 20 MG capsule Take 20 mg by mouth daily.    . ondansetron (ZOFRAN-ODT) 4 MG disintegrating tablet Take by mouth.    . potassium chloride (MICRO-K) 10 MEQ CR capsule Take by mouth 2 (two) times daily.    . prochlorperazine (COMPAZINE) 10 MG tablet Take 10 mg by mouth every 6 (six) hours as needed.    . senna (SENOKOT) 8.6 MG tablet Take 2 tablets by mouth at bedtime.    . Vitamin D, Ergocalciferol, (DRISDOL) 50000 units CAPS capsule Take 1 capsule (50,000 Units total) by mouth every 7 (seven) days. 12 capsule 0   No current facility-administered medications for this visit.    REVIEW OF SYSTEMS:   Review of Systems  Constitutional: Positive for appetite change (moderately decreased) and fatigue (depleted).  Eyes: Negative for eye problems.  Respiratory: Positive for cough and shortness of breath (w/ exertion).   Genitourinary: Positive for difficulty urinating (incontinence).     Neurological: Positive for dizziness and headaches.  Psychiatric/Behavioral: Positive for depression. The patient is nervous/anxious.   All other systems reviewed and are negative.    PHYSICAL EXAMINATION: ECOG PERFORMANCE STATUS: 2 - Symptomatic, <50% confined to bed  Vitals:   08/19/19 1138  BP: (!) 116/59  Pulse: (!) 107  Resp: 20  Temp: (!) 97.1 F (36.2 C)  SpO2: 94%   Filed Weights   08/19/19 1138  Weight: 197 lb 8 oz (89.6 kg)   Physical Exam Vitals reviewed.  Constitutional:      Appearance: Normal appearance. She is obese.  Cardiovascular:     Rate and Rhythm: Normal rate and regular rhythm.     Pulses: Normal pulses.     Heart sounds: Normal heart sounds.  Pulmonary:     Effort: Pulmonary effort is normal.     Breath sounds: Normal breath sounds.  Abdominal:     Palpations: Abdomen is soft. There is no mass.     Tenderness: There is no abdominal tenderness.  Musculoskeletal:     Right lower leg: Edema (1+) present.     Left lower leg: Edema (1+) present.  Neurological:     General: No focal deficit present.     Mental Status: She is alert and oriented to person, place, and time.  Psychiatric:        Mood and Affect: Mood normal.        Behavior: Behavior normal.      LABORATORY DATA:  I have reviewed the data as listed CBC Latest Ref Rng & Units 05/27/2018 02/11/2018 11/26/2017  WBC 3.4 - 10.8 x10E3/uL 7.3 6.1 7.9  Hemoglobin 11.1 - 15.9 g/dL 10.4(L) 9.7(L) 9.1(L)  Hematocrit 34.0 - 46.6 % 31.7(L) 29.4(L) 28.5(L)  Platelets 150 - 450 x10E3/uL 225 226 255   CMP Latest Ref Rng & Units 05/27/2018 02/11/2018 11/26/2017  Glucose 65 - 99 mg/dL 103(H) 88 85  BUN 8 - 27 mg/dL 21 16 15   Creatinine 0.57 - 1.00 mg/dL 1.33(H) 1.21(H) 1.07(H)  Sodium 134 - 144 mmol/L 142 143 144  Potassium 3.5 - 5.2 mmol/L 3.3(L) 4.0 4.7  Chloride 96 - 106 mmol/L 101 105 103  CO2 20 - 29 mmol/L 25 23 29   Calcium 8.7 -  10.3 mg/dL 9.5 9.2 9.4  Total Protein 6.0 - 8.5 g/dL 6.5  6.6 6.3  Total Bilirubin 0.0 - 1.2 mg/dL 0.2 0.2 0.2  Alkaline Phos 39 - 117 IU/L 119(H) 107 100  AST 0 - 40 IU/L 26 20 20   ALT 0 - 32 IU/L 18 15 12     RADIOGRAPHIC STUDIES: I have personally reviewed the radiological images as listed and agreed with the findings in the report.  ASSESSMENT:  1.  Metastatic non-small cell lung cancer: -History of stage IIIa (T3 N1 M0) non-small cell lung cancer presented with 2 masses in the left upper lobe as well as questionable left hilar adenopathy diagnosed in December 2018. -4 cycles of platinum and pemetrexed from 04/02/2017 through 06/08/2017 -Left upper lobectomy with en bloc LLL superior segmentectomy on 07/08/2017, pathology showing invasive adenocarcinoma, moderately to poorly differentiated with focal treatment effect and necrosis, 4.3 cm (mass #1) and invasive squamous cell carcinoma, moderately to poorly differentiated, 3.1 cm (mass #2), margins negative, YPT3Y PN 1. -PET scan on 08/04/2018 showed hypermetabolic right apical and retroaortic postradiation changes, intensely hypermetabolic subpleural spiculated nodules/correlating mass in the superior segment of the left lower lobe suspicious for locoregional disease recurrence.  Small left pleural effusion demonstrated mild FDG activity.  Right hepatic lobe segment 5 hypodensity with intense hypermetabolic activity.  Hypermetabolic and markedly enlarged porta hepatis lymph nodes intimately related to the caudate lobe. -MRI of the brain on 09/16/2018 shows 2 ring-enhancing lesions in the parasagittal right frontal lobe and long the left parieto-occipital sulcus measuring 1.7 and 1.3 cm respectively. -4 cycles of carbo/Pem/Pembro from 09/20/2018 through 12/06/2018 -Pembrolizumab maintenance from 01/03/2019 through 04/25/2019, with flare of rheumatoid arthritis. -Started on Remicade on 05/27/2019. -She was switched to pemetrexed maintenance on 06/27/2019. -She moved to [facility].  She was due for her  pemetrexed on 08/08/2019. -She walks with the help of her walker and does very minimal household work.  She needs assistance taking shower and clothing herself. -She has tolerated pemetrexed except some tiredness and decreased appetite lasting 2 to 3 days after each treatment.  2.  Brain metastasis: -Underwent SRS to the brain meta stasis in the left temporal lobe, right frontal and left frontal lobes. -Last MRI of the brain was on 06/15/2019.  3.  Family history: -Maternal grandmother had colon cancer. -Maternal aunt had throat cancer. -Mother had breast cancer. -Oldest son died of pancreatic cancer.   PLAN:  1.  Metastatic non-small cell lung cancer: -She would like to continue therapy for her lung cancer locally. -I have recommended restaging CT CAP in comparison with CT scans from 05/31/2019 done at [facility]. -I have also talked to her about the need for port placement.  She is agreeable. -I plan to see her back after the CT scans and will initiate pemetrexed at the same time. -We will consider testing for foundation 1.  2.  Brain metastasis: -Denies any double vision.  No headaches. -Plan to repeat brain MRI in mid September.  3.  Family history: -Because of extensive family history, I have recommended genetic testing.  4.  Rheumatoid arthritis: -She was started on infliximab on 05/27/2019. -She recently finished prednisone taper. -She will follow up with Dr. Estanislado Pandy.    All questions were answered. The patient knows to call the clinic with any problems, questions or concerns.   Derek Jack, MD, 08/19/19 12:16 PM  [facility] [phone]   I, Milinda Antis, am acting as a scribe for Dr. Sanda Linger.  I, Derek Jack MD, have reviewed the above documentation for accuracy and completeness, and I agree with the above.

## 2019-08-19 NOTE — Patient Instructions (Addendum)
[facility] at [facility] Discharge Instructions   You were seen and examined by [name] today. He discussed your past medical history, your family history and your functional status (what you are able to do for yourself).  [name] recommends genetic testing. You will meet with our Dietitian.  [name] will repeat imaging and follow-up with you after scans. At your next visit, we will plan to proceed with treatment that day.  Thank you for choosing [facility] at [facility] to provide your oncology and hematology care.  To afford each patient quality time with our provider, please arrive at least 15 minutes before your scheduled appointment time.   If you have a lab appointment with the [facility] please come in thru the Main Entrance and check in at the main information desk.  You need to re-schedule your appointment should you arrive 10 or more minutes late.  We strive to give you quality time with our providers, and arriving late affects you and other patients whose appointments are after yours.  Also, if you no show three or more times for appointments you may be dismissed from the clinic at the providers discretion.     Again, thank you for choosing [facility].  Our hope is that these requests will decrease the amount of time that you wait before being seen by our physicians.       _____________________________________________________________  Should you have questions after your visit to [facility], please contact our office at [phone] and follow the prompts.  Our office hours are 8:00 a.m. and 4:30 p.m. Monday - Friday.  Please note that voicemails left after 4:00 p.m. may not be returned until the following business day.  We are closed weekends and major holidays.  You do have access to a nurse 24-7, just call the main number to the clinic [phone] and do not press any  options, hold on the line and a nurse will answer the phone.    For prescription refill requests, have your pharmacy contact our office and allow 72 hours.    Due to Covid, you will need to wear a mask upon entering the hospital. If you do not have a mask, a mask will be given to you at the Main Entrance upon arrival. For doctor visits, patients may have 1 support person age 69 or older with them. For treatment visits, patients can not have anyone with them due to social distancing guidelines and our immunocompromised population.

## 2019-08-22 ENCOUNTER — Inpatient Hospital Stay: Payer: Medicare Other | Attending: Genetic Counselor | Admitting: Genetic Counselor

## 2019-08-22 ENCOUNTER — Other Ambulatory Visit: Payer: Self-pay | Admitting: Genetic Counselor

## 2019-08-22 ENCOUNTER — Encounter: Payer: Self-pay | Admitting: Genetic Counselor

## 2019-08-22 ENCOUNTER — Inpatient Hospital Stay: Payer: Medicare Other

## 2019-08-22 ENCOUNTER — Other Ambulatory Visit: Payer: Self-pay

## 2019-08-22 DIAGNOSIS — C3412 Malignant neoplasm of upper lobe, left bronchus or lung: Secondary | ICD-10-CM

## 2019-08-22 DIAGNOSIS — Z800 Family history of malignant neoplasm of digestive organs: Secondary | ICD-10-CM | POA: Diagnosis not present

## 2019-08-22 DIAGNOSIS — Z803 Family history of malignant neoplasm of breast: Secondary | ICD-10-CM | POA: Diagnosis not present

## 2019-08-22 LAB — GENETIC SCREENING ORDER

## 2019-08-22 NOTE — Progress Notes (Signed)
REFERRING PROVIDER: Derek Jack, MD [address] [place],  [place] [address]  PRIMARY PROVIDER:  Jake Samples, PA-C  PRIMARY REASON FOR VISIT:  1. Primary malignant neoplasm of left upper lobe of lung ([place])   2. Family history of pancreatic cancer   3. Family history of breast cancer   4. Family history of colon cancer      HISTORY OF PRESENT ILLNESS:   Rose Lutz, a 69 y.o. female, was seen for a [place] cancer genetics consultation at the request of [name] due to a personal and family history of cancer.  Rose Lutz presents to clinic today to discuss the possibility of a hereditary predisposition to cancer, genetic testing, and to further clarify her future cancer risks, as well as potential cancer risks for family members.   In 2018, at the age of 69, Rose Lutz was diagnosed with cancer of the left upper lobe of the lung. The treatment plan chemotherapy.    CANCER HISTORY:  Oncology History   No history exists.     RISK FACTORS:  Menarche was at age 69.  First live birth at age 69.  OCP use for approximately <5 years.  Ovaries intact: no.  Hysterectomy: yes.  Menopausal status: postmenopausal.  HRT use: 0 years. Colonoscopy: yes; normal. Mammogram within the last year: yes. Number of breast biopsies: 0. Up to date with pelvic exams: no. Any excessive radiation exposure in the past: no  Past Medical History:  Diagnosis Date   Chronic pain    Depression    Essential hypertension    Family history of breast cancer    Family history of colon cancer    Family history of pancreatic cancer    GERD (gastroesophageal reflux disease)    History of lung cancer 06/2017   Osteoarthritis    Pulmonary asbestosis ([place])    Rheumatoid arthritis ([place])     Past Surgical History:  Procedure Laterality Date   ABDOMINAL HYSTERECTOMY     BILATERAL OOPHORECTOMY     CESAREAN SECTION     x3   FEMUR FRACTURE SURGERY     KNEE ARTHROPLASTY  Right    LUNG CANCER SURGERY Left 07/08/2017   pelvis fracture/surgery     Dr. Cheri Rous   TONSILLECTOMY     VIDEO BRONCHOSCOPY WITH ENDOBRONCHIAL NAVIGATION Left 12/31/2016   Procedure: VIDEO BRONCHOSCOPY WITH ENDOBRONCHIAL NAVIGATION, left upper lung lobe;  Surgeon: Collene Gobble, MD;  Location: [facility];  Service: Thoracic;  Laterality: Left;   YAG LASER APPLICATION Right 94/50/3888   Procedure: YAG LASER APPLICATION;  Surgeon: Rutherford Guys, MD;  Location: [facility];  Service: Ophthalmology;  Laterality: Right;   YAG LASER APPLICATION Left 28/00/3491   Procedure: YAG LASER APPLICATION;  Surgeon: Rutherford Guys, MD;  Location: [facility];  Service: Ophthalmology;  Laterality: Left;    Social History   Socioeconomic History   Marital status: Married    Spouse name: Not on file   Number of children: 3   Years of education: Not on file   Highest education level: Not on file  Occupational History   Occupation: DISABLED  Tobacco Use   Smoking status: Former Smoker    Packs/day: 1.25    Years: 38.00    Pack years: 47.50    Types: Cigarettes    Start date: 01/21/1968    Quit date: 01/20/2006    Years since quitting: 13.5   Smokeless tobacco: Never Used  Vaping Use   Vaping Use: Never used  Substance and Sexual Activity   Alcohol use: No    Alcohol/week: 0.0 standard drinks   Drug use: No   Sexual activity: Not Currently  Other Topics Concern   Not on file  Social History Narrative   Not on file   Social Determinants of Health   Financial Resource Strain: Medium Risk   Difficulty of Paying Living Expenses: Somewhat hard  Food Insecurity: No Food Insecurity   Worried About Running Out of Food in the Last Year: Never true   Ran Out of Food in the Last Year: Never true  Transportation Needs: No Transportation Needs   Lack of Transportation (Medical): No   Lack of Transportation (Non-Medical): No  Physical Activity: Inactive   Days of Exercise per Week: 0 days    Minutes of Exercise per Session: 0 min  Stress: Stress Concern Present   Feeling of Stress : To some extent  Social Connections: Moderately Isolated   Frequency of Communication with Friends and Family: Twice a week   Frequency of Social Gatherings with Friends and Family: Never   Attends Religious Services: 1 to 4 times per year   Active Member of [employer] or Organizations: No   Attends Music therapist: Never   Marital Status: Married     FAMILY HISTORY:  We obtained a detailed, 4-generation family history.  Significant diagnoses are listed below: Family History  Problem Relation Age of Onset   Breast cancer Mother 65       d. 60   Colon cancer Maternal Grandmother 58       d. 91   Pancreatic cancer Son 60   Healthy Father    Arthritis Sister    Arthritis Paternal Grandmother    Other Son 89       MVA   Esophageal cancer Maternal Aunt     The patient has three children.  One son died in a car accident and a second son died of pancreatic cancer which he was diagnosed with at age 42.  Her daughter is cancer free.  She has a full brother and sister and a paternal half sister.  All are cancer free, but the half sister died of a blood clot.  Both parents are deceased.  The patient's mother was diagnosed with breast cancer at 67.  She had two full brothers a full sister and a maternal half brother.  Her sister had throat cancer.  The patient's maternal grandmother had colon cancer at 61 and her grandfather died natural causes.  The patient's father also died of natural causes at 69.  He had a brother and two sisters who were cancer free.  Her paternal grandparents are deceased.  Rose Lutz is unaware of previous family history of genetic testing for hereditary cancer risks. Patient's ancestors are of [place], [place] and [place] descent.. There is no reported Ashkenazi Jewish ancestry. There is no known consanguinity.  GENETIC COUNSELING ASSESSMENT: Rose Lutz is  a 69 y.o. female with a personal and family history of cancer which is somewhat suggestive of a hereditary cancer syndrome and predisposition to cancer given the early ages of onset and combination of cancers. We, therefore, discussed and recommended the following at today's visit.   DISCUSSION: We discussed that 5 - 10% of breast cancer and up to 15% of pancreatic cancer is hereditary, with most cases associated with BRCA mutations.  There are other genes that can be associated with hereditary breast and pancreatic cancer syndromes.  These include ATM and  PALB2.  We discussed that testing is beneficial for several reasons including knowing how to follow individuals and understand if other family members could be at risk for cancer and allow them to undergo genetic testing.   We reviewed the characteristics, features and inheritance patterns of hereditary cancer syndromes. We also discussed genetic testing, including the appropriate family members to test, the process of testing, insurance coverage and turn-around-time for results. We discussed the implications of a negative, positive, carrier and/or variant of uncertain significant result. We recommended Rose Lutz pursue genetic testing for the 56 gene common hereditary and pancreatic cancer gene panel.   Based on Rose Lutz's personal and family history of cancer, she meets medical criteria for genetic testing. Despite that she meets criteria, she may still have an out of pocket cost. We discussed that if her out of pocket cost for testing is over $100, the laboratory will call and confirm whether she wants to proceed with testing.  If the out of pocket cost of testing is less than $100 she will be billed by the genetic testing laboratory.   PLAN: After considering the risks, benefits, and limitations, Rose Lutz provided informed consent to pursue genetic testing and the blood sample was sent to [facility] for analysis of the custom panel. Results  should be available within approximately 2-3 weeks' time, at which point they will be disclosed by telephone to Rose Lutz, as will any additional recommendations warranted by these results. Rose Lutz will receive a summary of her genetic counseling visit and a copy of her results once available. This information will also be available in Epic.   Lastly, we encouraged Rose Lutz to remain in contact with cancer genetics annually so that we can continuously update the family history and inform her of any changes in cancer genetics and testing that may be of benefit for this family.   Rose Lutz questions were answered to her satisfaction today. Our contact information was provided should additional questions or concerns arise. Thank you for the referral and allowing [place] to share in the care of your patient.   Yenny Kosa P. Florene Glen, [place], [facility] Licensed, Insurance risk surveyor [email or web address] phone: [phone]  The patient was seen for a total of 45 minutes in face-to-face genetic counseling.  This patient was discussed with Drs. Magrinat, Lindi Adie and/or Burr Medico who agrees with the above.    _______________________________________________________________________ For Office Staff:  Number of people involved in session: 2 Was an Intern/ student involved with case: no

## 2019-08-24 ENCOUNTER — Telehealth: Payer: Self-pay | Admitting: Rheumatology

## 2019-08-24 NOTE — Telephone Encounter (Signed)
Patient will need an office visit and labs prior to any medication refills. Otherwise she may get her medication refills from [facility] rheumatology.

## 2019-08-24 NOTE — Telephone Encounter (Signed)
Patient request a refill on generic Plaquenil sent to [facility] on [address] In [place].

## 2019-08-25 ENCOUNTER — Other Ambulatory Visit: Payer: Medicare Other | Admitting: Internal Medicine

## 2019-08-25 ENCOUNTER — Encounter: Payer: Self-pay | Admitting: Internal Medicine

## 2019-08-25 ENCOUNTER — Other Ambulatory Visit: Payer: Self-pay

## 2019-08-25 DIAGNOSIS — Z7189 Other specified counseling: Secondary | ICD-10-CM | POA: Diagnosis not present

## 2019-08-25 DIAGNOSIS — Z515 Encounter for palliative care: Secondary | ICD-10-CM

## 2019-08-25 NOTE — Progress Notes (Signed)
Aug 5th, 2021 [facility] Palliative Care Consult Note Telephone: [phone]  Fax: [phone]  PATIENT NAME: Rose Lutz DOB: [birth year] MRN: [id number]  [address]  [place] [id number] [phone]  PRIMARY CARE PROVIDER:   Dr. Darliss Lutz [facility].    Rose Samples, PA-C   Dr Rose Lutz (Oncology) Cassell Clement 08/19/2019  Dr. Estanislado Lutz.(Rheumatology)  REFERRING PROVIDER:  Jake Samples, PA-C [address] [place],  [place] [address]  RESPONSIBLE PARTYWynee, Lutz (Spouse)  [phone] [facility] Phone)  ASSESSMENT / RECOMMENDATIONS:  1. Advance Care Planning: A. Directives: Discussed with patient in the presence of her spouse. Patient desires no resuscitative efforts in the event of a cardiopulmonary arrest. Her spouse is supportive of her decisions. I completed 2 DNR forms, uploaded copy into [facility] EMR, and left originals in the home. Recommended that patient keep one copy with her when she leaves the home/goes to her doctor's appointments and place the other copy on the fridge where EMS would look for it should they be summoned to the home.  We discussed components of the MOST form. Patient would Limited Scope of Medical Intervention (no vent/avoid ICU/CPAP or BIPAP acceptable) Yes to IVFs and Antibiotics. Not sure about tube feedings. Patient wished to think about these options more, before putting pen to paper. I left a copy of the form with patient education material for her further review.  We discussed in brief a Living Will, and HCPOA. She's wishes some time to consider this further. B. Goals of Care: Continue therapy for her lung cancer locally (transferred care from [facility] in [place]). She has a chest CT and brain MRI scheduled for staging purposes. She is also scheduled for placement of a port-a-cath and subsequent chemo.  She recently has had Genetic testing completed.  Patient anticipates she will need further SRS for brain metastasis. Patient's goal is to be able to drive, ambulate up and about to do her household chores, and have more energy and endurance to get out of the house. Going to [place] and visiting [place] and [place] are on her bucket list.    2. Cognitive / Functional status, Symptom Management: Patient is alert and oriented x 3. She is alert and pleasantly conversant.  Current weight is 197lb. BMI 33.9 kg/m2. States good appetite; mild decreased appetite / nausea after treatments.  She gets very dyspneic with transferring or ambulating a few steps. She is maintaining her oxygen saturations mid to high 90's at rest and with exertion, though her HR increased from 80's at rest, to 120's with minimal exertion.   Pain mostly in her knees form arthritis, exacerbated with immunotherapy Rx. At one point that therapy needed to be interrupted due to that pain. She completed a prednisone course (over 3-4 months) and that, with addition of medication for her rheumatoid arthritis, has improved her arthritic discomfort.   She ambulates with use of rolling walker; distance limited by dyspnea. She usually need assist to transfer. Not too motivated to exercise, but she appears very deconditioned. Spouse had to drag her into the house on a sheet, then still couldn't assist her to stand. They finally had to call 911 for transfer assist.    -Left message with office of PCP for OT/PT referral  -I reviewed some low impact chair and standing exercises with her and her spouse. I'm going to mail a copy of these to her home.  -Discussed use of gait belt for  spouse to use to assist patient with transferring and having pt sitting on a higher cushion to help raise her sitting height, to ease transfer. They are considering purchase of an Optician, dispensing. There are assistive lift cushions available; I'll mail the patient some information on this.  3. Family / Community  Supports:  Eldest son deceased from pancreatic cancer. Another son deceased in a car wreck when teenager. Patient used to work in [employer] and has exposure to asbestos. Married 26 yrs (second marriage); spouse very supportive. They do not have children together. Most of patient's family form first marriage live in [place].   4. Follow up Palliative Care Visit: Patient will call for f/u when she want to complete MOST form.   I spent 60 minutes providing this consultation from 3:30pm-4:40pm. More than 50% of the time in this consultation was spent coordinating communication.   HISTORY OF PRESENT ILLNESS:  Rose Lutz is a 69 y.o.  female with bilateral non-small cell lung cancer metastatic to the liver and brain (iniitial dx late 2018. Chemo/LUL lobectomy @ [facility] in [place]. Recurrence of disease 06/2018 with new sites metastatic dx. SRS brain mets L temporal lobe, R and L frontal lobes.  H/O chronic pain, depression, HTN, GERD, osteoarthritis, pulmonary asbestosis, rheumatoid arthritis.  Palliative Care was asked to help address goals of care.   CODE STATUS: DNR  PPS: weak 60%  HOSPICE ELIGIBILITY/DIAGNOSIS: TBD PAST MEDICAL HISTORY:  Past Medical History:  Diagnosis Date  . Chronic pain   . Depression   . Essential hypertension   . Family history of breast cancer   . Family history of colon cancer   . Family history of pancreatic cancer   . GERD (gastroesophageal reflux disease)   . History of lung cancer 06/2017  . Osteoarthritis   . Pulmonary asbestosis ([place])   . Rheumatoid arthritis ([place])     SOCIAL HX:  Social History   Tobacco Use  . Smoking status: Former Smoker    Packs/day: 1.25    Years: 38.00    Pack years: 47.50    Types: Cigarettes    Start date: 01/21/1968    Quit date: 01/20/2006    Years since quitting: 13.6  . Smokeless tobacco: Never Used  Substance Use Topics  . Alcohol use: No    Alcohol/week: 0.0 standard drinks     ALLERGIES:  Allergies  Allergen Reactions  . Quinine Derivatives Nausea And Vomiting  . Sulfa Antibiotics Nausea And Vomiting  . Percocet [Oxycodone-Acetaminophen] Other (See Comments)    Headahces     PERTINENT MEDICATIONS:  Outpatient Encounter Medications as of 08/25/2019  Medication Sig  . albuterol (VENTOLIN HFA) 108 (90 Base) MCG/ACT inhaler Inhale into the lungs. (Patient not taking: Reported on 08/18/2019)  . alendronate (FOSAMAX) 35 MG tablet Take by mouth.  Marland Kitchen amitriptyline (ELAVIL) 100 MG tablet Take 100 mg by mouth at bedtime.  . Ascorbic Acid (VITAMIN C) 250 MG CHEW Chew 1 tablet by mouth daily.  . cetirizine (ZYRTEC) 10 MG tablet Take 10 mg by mouth at bedtime.  . diclofenac Sodium (VOLTAREN) 1 % GEL Apply topically.  . diphenhydrAMINE (BENADRYL) 25 mg capsule Take 50-100 mg by mouth every 6 (six) hours as needed for itching or allergies. (Patient not taking: Reported on 08/18/2019)  . folic acid (FOLVITE) 1 MG tablet Take 1 tablet (1 mg total) by mouth daily.  Marland Kitchen gabapentin (NEURONTIN) 100 MG capsule TK 1 C PO TID  . hydrALAZINE (APRESOLINE) 25  MG tablet Take 25 mg by mouth 3 (three) times daily.  Marland Kitchen HYDROcodone-acetaminophen (NORCO) 10-325 MG tablet Take 1 tablet by mouth every 6 (six) hours as needed for moderate pain.  (Patient not taking: Reported on 08/18/2019)  . hydroxychloroquine (PLAQUENIL) 200 MG tablet TAKE 2 TABLETS BY MOUTH  EVERY EVENING. [place]  . leflunomide (ARAVA) 10 MG tablet TAKE 2 TABLETS BY MOUTH  DAILY  . losartan-hydrochlorothiazide (HYZAAR) 50-12.5 MG tablet Take 1 tablet by mouth daily.  . Multiple Vitamin (MULTIVITAMIN WITH MINERALS) TABS tablet Take 1 tablet by mouth daily.  Marland Kitchen omeprazole (PRILOSEC) 20 MG capsule Take 20 mg by mouth daily.  . ondansetron (ZOFRAN-ODT) 4 MG disintegrating tablet Take by mouth.  . potassium chloride (MICRO-K) 10 MEQ CR capsule Take by mouth 2 (two) times daily.  . prochlorperazine (COMPAZINE) 10 MG  tablet Take 10 mg by mouth every 6 (six) hours as needed.  . senna (SENOKOT) 8.6 MG tablet Take 2 tablets by mouth at bedtime.  . Vitamin D, Ergocalciferol, (DRISDOL) 50000 units CAPS capsule Take 1 capsule (50,000 Units total) by mouth every 7 (seven) days.   No facility-administered encounter medications on file as of 08/25/2019.    GENERAL PHYSICAL EXAM:   Well nourished older female alert and pleasantly conversant. Spouse in attendance. Marked dyspnea with transfers and ambulating short distances.  Lung fields clear, but bronchial breath sounds L.  Julianne Handler, NP

## 2019-08-25 NOTE — Telephone Encounter (Signed)
Attempted to contact the patient and left message for patient to call the office.  

## 2019-08-25 NOTE — Telephone Encounter (Signed)
Patient advised she will need an office visit and labs prior to any medication refills. Otherwise she may get her medication refills from [facility] rheumatology.

## 2019-08-31 ENCOUNTER — Other Ambulatory Visit (HOSPITAL_COMMUNITY): Payer: Self-pay | Admitting: Hematology

## 2019-08-31 DIAGNOSIS — Z7189 Other specified counseling: Secondary | ICD-10-CM | POA: Insufficient documentation

## 2019-08-31 NOTE — Progress Notes (Signed)
START ON PATHWAY REGIMEN - Non-Small Cell Lung     A cycle is every 21 days:     Pembrolizumab      Pemetrexed   **Always confirm dose/schedule in your pharmacy ordering system**  Patient Characteristics: Stage IV Metastatic, Nonsquamous, Maintenance - Chemotherapy/Immunotherapy, PS = 0, 1, Initial Pembrolizumab + Pemetrexed + Carboplatin Therapeutic Status: Stage IV Metastatic Histology: Nonsquamous Cell ROS1 Rearrangement Status: Awaiting Test Results Other Mutations/Biomarkers: No Other Actionable Mutations Chemotherapy/Immunotherapy LOT: Maintenance Chemotherapy/Immunotherapy Molecular Targeted Therapy: Not Appropriate KRAS G12C Mutation Status: Awaiting Test Results MET Exon 14 Mutation Status: Awaiting Test Results RET Gene Fusion Status: Awaiting Test Results EGFR Mutation Status: Awaiting Test Results NTRK Gene Fusion Status: Awaiting Test Results PD-L1 Expression Status: Awaiting Test Results ALK Rearrangement Status: Awaiting Test Results BRAF V600E Mutation Status: Awaiting Test Results ECOG Performance Status: 1 Intent of Therapy: Non-Curative / Palliative Intent, Discussed with Patient

## 2019-09-01 ENCOUNTER — Encounter: Payer: Self-pay | Admitting: General Surgery

## 2019-09-01 ENCOUNTER — Ambulatory Visit: Payer: Medicare Other | Admitting: General Surgery

## 2019-09-01 ENCOUNTER — Other Ambulatory Visit: Payer: Self-pay

## 2019-09-01 VITALS — BP 109/72 | HR 74 | Temp 97.9°F | Resp 14 | Wt 197.0 lb

## 2019-09-01 DIAGNOSIS — C3492 Malignant neoplasm of unspecified part of left bronchus or lung: Secondary | ICD-10-CM | POA: Diagnosis not present

## 2019-09-01 NOTE — Patient Instructions (Signed)

## 2019-09-01 NOTE — Progress Notes (Signed)
Rose Lutz; [id number]; 1950/12/07   HPI Patient is a 69 year old white female who was referred to my care by [name] for Port-A-Cath insertion.  She has left lung cancer and is about to undergo chemotherapy.  She needs a Port-A-Cath for central venous access. Past Medical History:  Diagnosis Date  . Chronic pain   . Depression   . Essential hypertension   . Family history of breast cancer   . Family history of colon cancer   . Family history of pancreatic cancer   . GERD (gastroesophageal reflux disease)   . History of lung cancer 06/2017  . Osteoarthritis   . Pulmonary asbestosis ([facility])   . Rheumatoid arthritis [facility])     Past Surgical History:  Procedure Laterality Date  . ABDOMINAL HYSTERECTOMY    . BILATERAL OOPHORECTOMY    . CESAREAN SECTION     x3  . FEMUR FRACTURE SURGERY    . KNEE ARTHROPLASTY Right   . LUNG CANCER SURGERY Left 07/08/2017  . pelvis fracture/surgery     Dr. Cheri Rous  . TONSILLECTOMY    . VIDEO BRONCHOSCOPY WITH ENDOBRONCHIAL NAVIGATION Left 12/31/2016   Procedure: VIDEO BRONCHOSCOPY WITH ENDOBRONCHIAL NAVIGATION, left upper lung lobe;  Surgeon: Collene Gobble, MD;  Location: [place];  Service: Thoracic;  Laterality: Left;  . YAG LASER APPLICATION Right 62/22/9798   Procedure: YAG LASER APPLICATION;  Surgeon: Rutherford Guys, MD;  Location: [facility];  Service: Ophthalmology;  Laterality: Right;  . YAG LASER APPLICATION Left 92/11/9415   Procedure: YAG LASER APPLICATION;  Surgeon: Rutherford Guys, MD;  Location: [facility];  Service: Ophthalmology;  Laterality: Left;    Family History  Problem Relation Age of Onset  . Breast cancer Mother 59       d. 46  . Colon cancer Maternal Grandmother 18       d. 24  . Pancreatic cancer Son 26  . Healthy Father   . Arthritis Sister   . Arthritis Paternal Grandmother   . Other Son 70       MVA  . Esophageal cancer Maternal Aunt     Current Outpatient Medications on File Prior to Visit  Medication Sig  Dispense Refill  . alendronate (FOSAMAX) 35 MG tablet Take 35 mg by mouth every 7 (seven) days.     Marland Kitchen amitriptyline (ELAVIL) 100 MG tablet Take 100 mg by mouth at bedtime.    . Ascorbic Acid (VITAMIN C) 250 MG CHEW Chew 250 mg by mouth daily.     . cetirizine (ZYRTEC) 10 MG tablet Take 10 mg by mouth daily as needed for allergies.     Marland Kitchen diclofenac Sodium (VOLTAREN) 1 % GEL Apply topically. (Patient not taking: Reported on 09/01/2019)    . gabapentin (NEURONTIN) 100 MG capsule Take 100 mg by mouth 3 (three) times daily.   5  . hydrALAZINE (APRESOLINE) 25 MG tablet Take 25 mg by mouth daily.     . hydroxychloroquine (PLAQUENIL) 200 MG tablet TAKE 2 TABLETS BY MOUTH  EVERY EVENING. [place] (Patient taking differently: Take 400 mg by mouth every Monday, Tuesday, Wednesday, Thursday, and Friday. ) 120 tablet 0  . leflunomide (ARAVA) 10 MG tablet TAKE 2 TABLETS BY MOUTH  DAILY (Patient taking differently: Take 20 mg by mouth daily. ) 60 tablet 0  . losartan-hydrochlorothiazide (HYZAAR) 50-12.5 MG tablet Take 1 tablet by mouth daily.    . Multiple Vitamin (MULTIVITAMIN WITH MINERALS) TABS tablet Take 1 tablet by mouth daily.    Marland Kitchen  omeprazole (PRILOSEC) 20 MG capsule Take 20 mg by mouth daily.    . ondansetron (ZOFRAN-ODT) 4 MG disintegrating tablet Take 4 mg by mouth every 8 (eight) hours as needed for nausea or vomiting.     . potassium chloride (MICRO-K) 10 MEQ CR capsule Take 10 mEq by mouth 2 (two) times daily.     . prochlorperazine (COMPAZINE) 10 MG tablet Take 10 mg by mouth every 6 (six) hours as needed for nausea or vomiting.     . senna (SENOKOT) 8.6 MG tablet Take 1 tablet by mouth at bedtime.     . Vitamin D, Ergocalciferol, (DRISDOL) 50000 units CAPS capsule Take 1 capsule (50,000 Units total) by mouth every 7 (seven) days. 12 capsule 0  . albuterol (VENTOLIN HFA) 108 (90 Base) MCG/ACT inhaler Inhale 1-2 puffs into the lungs every 6 (six) hours as needed for wheezing or shortness  of breath.     . diphenhydrAMINE (BENADRYL) 25 mg capsule Take 25 mg by mouth every 6 (six) hours as needed for itching or allergies.     . folic acid (FOLVITE) 1 MG tablet Take 1 tablet (1 mg total) by mouth daily. 90 tablet 4  . HYDROcodone-acetaminophen (NORCO) 10-325 MG tablet Take 1 tablet by mouth every 6 (six) hours as needed for moderate pain.      No current facility-administered medications on file prior to visit.    Allergies  Allergen Reactions  . Quinine Derivatives Nausea And Vomiting  . Sulfa Antibiotics Nausea And Vomiting  . Percocet [Oxycodone-Acetaminophen] Other (See Comments)    Headahces    Social History   Substance and Sexual Activity  Alcohol Use No  . Alcohol/week: 0.0 standard drinks    Social History   Tobacco Use  Smoking Status Former Smoker  . Packs/day: 1.25  . Years: 38.00  . Pack years: 47.50  . Types: Cigarettes  . Start date: 01/21/1968  . Quit date: 01/20/2006  . Years since quitting: 13.6  Smokeless Tobacco Never Used    Review of Systems  Constitutional: Positive for malaise/fatigue.  HENT: Negative.   Eyes: Negative.   Respiratory: Positive for shortness of breath.   Cardiovascular: Negative.   Gastrointestinal: Negative.   Genitourinary: Negative.   Musculoskeletal: Negative.   Skin: Negative.   Neurological: Positive for weakness.  Endo/Heme/Allergies: Negative.   Psychiatric/Behavioral: Negative.     Objective   Vitals:   09/01/19 1043  BP: 109/72  Pulse: 74  Resp: 14  Temp: 97.9 F (36.6 C)  SpO2: 96%    Physical Exam Vitals reviewed.  Constitutional:      Appearance: Normal appearance. She is not ill-appearing.     Comments: Sits in a wheelchair due to frequent weakness  HENT:     Head: Normocephalic and atraumatic.  Cardiovascular:     Rate and Rhythm: Normal rate and regular rhythm.     Heart sounds: Normal heart sounds. No murmur heard.  No friction rub. No gallop.   Pulmonary:     Effort: No  respiratory distress.     Breath sounds: Normal breath sounds. No stridor. No wheezing, rhonchi or rales.  Skin:    General: Skin is warm and dry.  Neurological:     Mental Status: She is alert and oriented to person, place, and time.    Dr. Tomie China notes reviewed Assessment  Non-small cell lung carcinoma, left Plan   Patient is scheduled for Port-A-Cath insertion on 09/05/2019.  The risks and benefits of the procedure including  bleeding, infection, and pneumothorax were fully explained to the patient, who gave informed consent.

## 2019-09-01 NOTE — H&P (Signed)
Rose Lutz; [id number]; 1950-02-18   HPI Patient is a 69 year old white female who was referred to my care by [name] for Port-A-Cath insertion.  She has left lung cancer and is about to undergo chemotherapy.  She needs a Port-A-Cath for central venous access. Past Medical History:  Diagnosis Date  . Chronic pain   . Depression   . Essential hypertension   . Family history of breast cancer   . Family history of colon cancer   . Family history of pancreatic cancer   . GERD (gastroesophageal reflux disease)   . History of lung cancer 06/2017  . Osteoarthritis   . Pulmonary asbestosis ([place])   . Rheumatoid arthritis [facility])     Past Surgical History:  Procedure Laterality Date  . ABDOMINAL HYSTERECTOMY    . BILATERAL OOPHORECTOMY    . CESAREAN SECTION     x3  . FEMUR FRACTURE SURGERY    . KNEE ARTHROPLASTY Right   . LUNG CANCER SURGERY Left 07/08/2017  . pelvis fracture/surgery     Dr. Cheri Rous  . TONSILLECTOMY    . VIDEO BRONCHOSCOPY WITH ENDOBRONCHIAL NAVIGATION Left 12/31/2016   Procedure: VIDEO BRONCHOSCOPY WITH ENDOBRONCHIAL NAVIGATION, left upper lung lobe;  Surgeon: Collene Gobble, MD;  Location: [place];  Service: Thoracic;  Laterality: Left;  . YAG LASER APPLICATION Right 85/88/5027   Procedure: YAG LASER APPLICATION;  Surgeon: Rutherford Guys, MD;  Location: [facility];  Service: Ophthalmology;  Laterality: Right;  . YAG LASER APPLICATION Left 74/12/8784   Procedure: YAG LASER APPLICATION;  Surgeon: Rutherford Guys, MD;  [facility];  Service: Ophthalmology;  Laterality: Left;    Family History  Problem Relation Age of Onset  . Breast cancer Mother 26       d. 39  . Colon cancer Maternal Grandmother 39       d. 84  . Pancreatic cancer Son 18  . Healthy Father   . Arthritis Sister   . Arthritis Paternal Grandmother   . Other Son 70       MVA  . Esophageal cancer Maternal Aunt     Current Outpatient Medications on File Prior to Visit  Medication Sig  Dispense Refill  . alendronate (FOSAMAX) 35 MG tablet Take 35 mg by mouth every 7 (seven) days.     Marland Kitchen amitriptyline (ELAVIL) 100 MG tablet Take 100 mg by mouth at bedtime.    . Ascorbic Acid (VITAMIN C) 250 MG CHEW Chew 250 mg by mouth daily.     . cetirizine (ZYRTEC) 10 MG tablet Take 10 mg by mouth daily as needed for allergies.     Marland Kitchen diclofenac Sodium (VOLTAREN) 1 % GEL Apply topically. (Patient not taking: Reported on 09/01/2019)    . gabapentin (NEURONTIN) 100 MG capsule Take 100 mg by mouth 3 (three) times daily.   5  . hydrALAZINE (APRESOLINE) 25 MG tablet Take 25 mg by mouth daily.     . hydroxychloroquine (PLAQUENIL) 200 MG tablet TAKE 2 TABLETS BY MOUTH  EVERY EVENING. [place] (Patient taking differently: Take 400 mg by mouth every Monday, Tuesday, Wednesday, Thursday, and Friday. ) 120 tablet 0  . leflunomide (ARAVA) 10 MG tablet TAKE 2 TABLETS BY MOUTH  DAILY (Patient taking differently: Take 20 mg by mouth daily. ) 60 tablet 0  . losartan-hydrochlorothiazide (HYZAAR) 50-12.5 MG tablet Take 1 tablet by mouth daily.    . Multiple Vitamin (MULTIVITAMIN WITH MINERALS) TABS tablet Take 1 tablet by mouth daily.    Marland Kitchen  omeprazole (PRILOSEC) 20 MG capsule Take 20 mg by mouth daily.    . ondansetron (ZOFRAN-ODT) 4 MG disintegrating tablet Take 4 mg by mouth every 8 (eight) hours as needed for nausea or vomiting.     . potassium chloride (MICRO-K) 10 MEQ CR capsule Take 10 mEq by mouth 2 (two) times daily.     . prochlorperazine (COMPAZINE) 10 MG tablet Take 10 mg by mouth every 6 (six) hours as needed for nausea or vomiting.     . senna (SENOKOT) 8.6 MG tablet Take 1 tablet by mouth at bedtime.     . Vitamin D, Ergocalciferol, (DRISDOL) 50000 units CAPS capsule Take 1 capsule (50,000 Units total) by mouth every 7 (seven) days. 12 capsule 0  . albuterol (VENTOLIN HFA) 108 (90 Base) MCG/ACT inhaler Inhale 1-2 puffs into the lungs every 6 (six) hours as needed for wheezing or shortness  of breath.     . diphenhydrAMINE (BENADRYL) 25 mg capsule Take 25 mg by mouth every 6 (six) hours as needed for itching or allergies.     . folic acid (FOLVITE) 1 MG tablet Take 1 tablet (1 mg total) by mouth daily. 90 tablet 4  . HYDROcodone-acetaminophen (NORCO) 10-325 MG tablet Take 1 tablet by mouth every 6 (six) hours as needed for moderate pain.      No current facility-administered medications on file prior to visit.    Allergies  Allergen Reactions  . Quinine Derivatives Nausea And Vomiting  . Sulfa Antibiotics Nausea And Vomiting  . Percocet [Oxycodone-Acetaminophen] Other (See Comments)    Headahces    Social History   Substance and Sexual Activity  Alcohol Use No  . Alcohol/week: 0.0 standard drinks    Social History   Tobacco Use  Smoking Status Former Smoker  . Packs/day: 1.25  . Years: 38.00  . Pack years: 47.50  . Types: Cigarettes  . Start date: 01/21/1968  . Quit date: 01/20/2006  . Years since quitting: 13.6  Smokeless Tobacco Never Used    Review of Systems  Constitutional: Positive for malaise/fatigue.  HENT: Negative.   Eyes: Negative.   Respiratory: Positive for shortness of breath.   Cardiovascular: Negative.   Gastrointestinal: Negative.   Genitourinary: Negative.   Musculoskeletal: Negative.   Skin: Negative.   Neurological: Positive for weakness.  Endo/Heme/Allergies: Negative.   Psychiatric/Behavioral: Negative.     Objective   Vitals:   09/01/19 1043  BP: 109/72  Pulse: 74  Resp: 14  Temp: 97.9 F (36.6 C)  SpO2: 96%    Physical Exam Vitals reviewed.  Constitutional:      Appearance: Normal appearance. She is not ill-appearing.     Comments: Sits in a wheelchair due to frequent weakness  HENT:     Head: Normocephalic and atraumatic.  Cardiovascular:     Rate and Rhythm: Normal rate and regular rhythm.     Heart sounds: Normal heart sounds. No murmur heard.  No friction rub. No gallop.   Pulmonary:     Effort: No  respiratory distress.     Breath sounds: Normal breath sounds. No stridor. No wheezing, rhonchi or rales.  Skin:    General: Skin is warm and dry.  Neurological:     Mental Status: She is alert and oriented to person, place, and time.    Dr. Tomie China notes reviewed Assessment  Non-small cell lung carcinoma, left Plan   Patient is scheduled for Port-A-Cath insertion on 09/05/2019.  The risks and benefits of the procedure including  bleeding, infection, and pneumothorax were fully explained to the patient, who gave informed consent.

## 2019-09-02 ENCOUNTER — Other Ambulatory Visit (HOSPITAL_COMMUNITY)
Admission: RE | Admit: 2019-09-02 | Discharge: 2019-09-02 | Disposition: A | Discharge status: Home / Self Care | Payer: Medicare Other | Source: Ambulatory Visit | Attending: General Surgery | Admitting: General Surgery

## 2019-09-02 ENCOUNTER — Ambulatory Visit (HOSPITAL_COMMUNITY)
Admission: RE | Admit: 2019-09-02 | Discharge: 2019-09-02 | Disposition: A | Discharge status: Home / Self Care | Payer: Medicare Other | Source: Ambulatory Visit | Attending: Hematology | Admitting: Hematology

## 2019-09-02 ENCOUNTER — Encounter (HOSPITAL_COMMUNITY)
Admission: RE | Admit: 2019-09-02 | Discharge: 2019-09-02 | Disposition: A | Discharge status: Home / Self Care | Payer: Medicare Other | Source: Ambulatory Visit | Attending: General Surgery | Admitting: General Surgery

## 2019-09-02 ENCOUNTER — Other Ambulatory Visit: Payer: Self-pay

## 2019-09-02 DIAGNOSIS — J432 Centrilobular emphysema: Secondary | ICD-10-CM | POA: Diagnosis not present

## 2019-09-02 DIAGNOSIS — K7689 Other specified diseases of liver: Secondary | ICD-10-CM | POA: Diagnosis not present

## 2019-09-02 DIAGNOSIS — Z20822 Contact with and (suspected) exposure to covid-19: Secondary | ICD-10-CM | POA: Diagnosis not present

## 2019-09-02 DIAGNOSIS — C3490 Malignant neoplasm of unspecified part of unspecified bronchus or lung: Secondary | ICD-10-CM | POA: Diagnosis not present

## 2019-09-02 MED ORDER — IOHEXOL 300 MG/ML  SOLN
100.0000 mL | Freq: Once | INTRAMUSCULAR | Status: AC | PRN
  Administered 2019-09-02: 100 mL via INTRAVENOUS

## 2019-09-02 NOTE — Patient Instructions (Signed)
Rose Lutz  09/02/2019     @PREFPERIOPPHARMACY @   Your procedure is scheduled on  09/05/2019.  Report to [facility] at  0730  A.M.  Call this number if you have problems the morning of surgery:  [phone]   Remember:  Do not eat or drink after midnight.                        Take these medicines the morning of surgery with A SIP OF WATER  Gabapentin, hydrocodone(if needed), plaquenil, arava, prilosec, zofran or compazine(if needed). Use your inhaler before you come and bring your rescue inhaler with you.    Do not wear jewelry, make-up or nail polish.  Do not wear lotions, powders, or perfumes, or deodorant. Please brush your teeth.  Do not shave 48 hours prior to surgery.  Men may shave face and neck.  Do not bring valuables to the hospital.  [facility] is not responsible for any belongings or valuables.  Contacts, dentures or bridgework may not be worn into surgery.  Leave your suitcase in the car.  After surgery it may be brought to your room.  For patients admitted to the hospital, discharge time will be determined by your treatment team.  Patients discharged the day of surgery will not be allowed to drive home.   Name and phone number of your driver:   family Special instructions:  DO NOT smoke the morning of your procedure.  Please read over the following fact sheets that you were given. Anesthesia Post-op Instructions and Care and Recovery After Surgery       Implanted Port Insertion, Care After This sheet gives you information about how to care for yourself after your procedure. Your health care provider may also give you more specific instructions. If you have problems or questions, contact your health care provider. What can I expect after the procedure? After the procedure, it is common to have:  Discomfort at the port insertion site.  Bruising on the skin over the port. This should improve over 3-4 days. Follow these instructions  at home: [facility] care  After your port is placed, you will get a manufacturer's information card. The card has information about your port. Keep this card with you at all times.  Take care of the port as told by your health care provider. Ask your health care provider if you or a family member can get training for taking care of the port at home. A home health care nurse may also take care of the port.  Make sure to remember what type of port you have. Incision care      Follow instructions from your health care provider about how to take care of your port insertion site. Make sure you: ? Wash your hands with soap and water before and after you change your bandage (dressing). If soap and water are not available, use hand sanitizer. ? Change your dressing as told by your health care provider. ? Leave stitches (sutures), skin glue, or adhesive strips in place. These skin closures may need to stay in place for 2 weeks or longer. If adhesive strip edges start to loosen and curl up, you may trim the loose edges. Do not remove adhesive strips completely unless your health care provider tells you to do that.  Check your port insertion site every day for signs of infection. Check for: ? Redness, swelling,  or pain. ? Fluid or blood. ? Warmth. ? Pus or a bad smell. Activity  Return to your normal activities as told by your health care provider. Ask your health care provider what activities are safe for you.  Do not lift anything that is heavier than 10 lb (4.5 kg), or the limit that you are told, until your health care provider says that it is safe. General instructions  Take over-the-counter and prescription medicines only as told by your health care provider.  Do not take baths, swim, or use a hot tub until your health care provider approves. Ask your health care provider if you may take showers. You may only be allowed to take sponge baths.  Do not drive for 24 hours if you were given a  sedative during your procedure.  Wear a medical alert bracelet in case of an emergency. This will tell any health care providers that you have a port.  Keep all follow-up visits as told by your health care provider. This is important. Contact a health care provider if:  You cannot flush your port with saline as directed, or you cannot draw blood from the port.  You have a fever or chills.  You have redness, swelling, or pain around your port insertion site.  You have fluid or blood coming from your port insertion site.  Your port insertion site feels warm to the touch.  You have pus or a bad smell coming from the port insertion site. Get help right away if:  You have chest pain or shortness of breath.  You have bleeding from your port that you cannot control. Summary  Take care of the port as told by your health care provider. Keep the manufacturer's information card with you at all times.  Change your dressing as told by your health care provider.  Contact a health care provider if you have a fever or chills or if you have redness, swelling, or pain around your port insertion site.  Keep all follow-up visits as told by your health care provider. This information is not intended to replace advice given to you by your health care provider. Make sure you discuss any questions you have with your health care provider. Document Revised: 08/04/2017 Document Reviewed: 08/04/2017 Elsevier Patient Education  [place] An implanted port is a device that is placed under the skin. It is usually placed in the chest. The device can be used to give IV medicine, to take blood, or for dialysis. You may have an implanted port if:  You need IV medicine that would be irritating to the small veins in your hands or arms.  You need IV medicines, such as antibiotics, for a long period of time.  You need IV nutrition for a long period of time.  You need  dialysis. Having a port means that your health care provider will not need to use the veins in your arms for these procedures. You may have fewer limitations when using a port than you would if you used other types of long-term IVs, and you will likely be able to return to normal activities after your incision heals. An implanted port has two main parts:  Reservoir. The reservoir is the part where a needle is inserted to give medicines or draw blood. The reservoir is round. After it is placed, it appears as a small, raised area under your skin.  Catheter. The catheter is a thin, flexible tube that connects  the reservoir to a vein. Medicine that is inserted into the reservoir goes into the catheter and then into the vein. How is my port accessed? To access your port:  A numbing cream may be placed on the skin over the port site.  Your health care provider will put on a mask and sterile gloves.  The skin over your port will be cleaned carefully with a germ-killing soap and allowed to dry.  Your health care provider will gently pinch the port and insert a needle into it.  Your health care provider will check for a blood return to make sure the port is in the vein and is not clogged.  If your port needs to remain accessed to get medicine continuously (constant infusion), your health care provider will place a clear bandage (dressing) over the needle site. The dressing and needle will need to be changed every week, or as told by your health care provider. What is flushing? Flushing helps keep the port from getting clogged. Follow instructions from your health care provider about how and when to flush the port. Ports are usually flushed with saline solution or a medicine called heparin. The need for flushing will depend on how the port is used:  If the port is only used from time to time to give medicines or draw blood, the port may need to be flushed: ? Before and after medicines have been  given. ? Before and after blood has been drawn. ? As part of routine maintenance. Flushing may be recommended every 4-6 weeks.  If a constant infusion is running, the port may not need to be flushed.  Throw away any syringes in a disposal container that is meant for sharp items (sharps container). You can buy a sharps container from a pharmacy, or you can make one by using an empty hard plastic bottle with a cover. How long will my port stay implanted? The port can stay in for as long as your health care provider thinks it is needed. When it is time for the port to come out, a surgery will be done to remove it. The surgery will be similar to the procedure that was done to put the port in. Follow these instructions at home:   Flush your port as told by your health care provider.  If you need an infusion over several days, follow instructions from your health care provider about how to take care of your port site. Make sure you: ? Wash your hands with soap and water before you change your dressing. If soap and water are not available, use alcohol-based hand sanitizer. ? Change your dressing as told by your health care provider. ? Place any used dressings or infusion bags into a plastic bag. Throw that bag in the trash. ? Keep the dressing that covers the needle clean and dry. Do not get it wet. ? Do not use scissors or sharp objects near the tube. ? Keep the tube clamped, unless it is being used.  Check your port site every day for signs of infection. Check for: ? Redness, swelling, or pain. ? Fluid or blood. ? Pus or a bad smell.  Protect the skin around the port site. ? Avoid wearing bra straps that rub or irritate the site. ? Protect the skin around your port from seat belts. Place a soft pad over your chest if needed.  Bathe or shower as told by your health care provider. The site may get wet as long as  you are not actively receiving an infusion.  Return to your normal activities  as told by your health care provider. Ask your health care provider what activities are safe for you.  Carry a medical alert card or wear a medical alert bracelet at all times. This will let health care providers know that you have an implanted port in case of an emergency. Get help right away if:  You have redness, swelling, or pain at the port site.  You have fluid or blood coming from your port site.  You have pus or a bad smell coming from the port site.  You have a fever. Summary  Implanted ports are usually placed in the chest for long-term IV access.  Follow instructions from your health care provider about flushing the port and changing bandages (dressings).  Take care of the area around your port by avoiding clothing that puts pressure on the area, and by watching for signs of infection.  Protect the skin around your port from seat belts. Place a soft pad over your chest if needed.  Get help right away if you have a fever or you have redness, swelling, pain, drainage, or a bad smell at the port site. This information is not intended to replace advice given to you by your health care provider. Make sure you discuss any questions you have with your health care provider. Document Revised: 04/30/2018 Document Reviewed: 02/09/2016 Elsevier Patient Education  2020 [place] After These instructions provide you with information about caring for yourself after your procedure. Your health care provider may also give you more specific instructions. Your treatment has been planned according to current medical practices, but problems sometimes occur. Call your health care provider if you have any problems or questions after your procedure. What can I expect after the procedure? After your procedure, you may:  Feel sleepy for several hours.  Feel clumsy and have poor balance for several hours.  Feel forgetful about what happened after the  procedure.  Have poor judgment for several hours.  Feel nauseous or vomit.  Have a sore throat if you had a breathing tube during the procedure. Follow these instructions at home: For at least 24 hours after the procedure:      Have a responsible adult stay with you. It is important to have someone help care for you until you are awake and alert.  Rest as needed.  Do not: ? Participate in activities in which you could fall or become injured. ? Drive. ? Use heavy machinery. ? Drink alcohol. ? Take sleeping pills or medicines that cause drowsiness. ? Make important decisions or sign legal documents. ? Take care of children on your own. Eating and drinking  Follow the diet that is recommended by your health care provider.  If you vomit, drink water, juice, or soup when you can drink without vomiting.  Make sure you have little or no nausea before eating solid foods. General instructions  Take over-the-counter and prescription medicines only as told by your health care provider.  If you have sleep apnea, surgery and certain medicines can increase your risk for breathing problems. Follow instructions from your health care provider about wearing your sleep device: ? Anytime you are sleeping, including during daytime naps. ? While taking prescription pain medicines, sleeping medicines, or medicines that make you drowsy.  If you smoke, do not smoke without supervision.  Keep all follow-up visits as told by your health care provider.  This is important. Contact a health care provider if:  You keep feeling nauseous or you keep vomiting.  You feel light-headed.  You develop a rash.  You have a fever. Get help right away if:  You have trouble breathing. Summary  For several hours after your procedure, you may feel sleepy and have poor judgment.  Have a responsible adult stay with you for at least 24 hours or until you are awake and alert. This information is not  intended to replace advice given to you by your health care provider. Make sure you discuss any questions you have with your health care provider. Document Revised: 04/06/2017 Document Reviewed: 04/29/2015 Elsevier Patient Education  [place]. How to Use Chlorhexidine for Bathing Chlorhexidine gluconate (CHG) is a germ-killing (antiseptic) solution that is used to clean the skin. It can get rid of the bacteria that normally live on the skin and can keep them away for about 24 hours. To clean your skin with CHG, you may be given:  A CHG solution to use in the shower or as part of a sponge bath.  A prepackaged cloth that contains CHG. Cleaning your skin with CHG may help lower the risk for infection:  While you are staying in the intensive care unit of the hospital.  If you have a vascular access, such as a central line, to provide short-term or long-term access to your veins.  If you have a catheter to drain urine from your bladder.  If you are on a ventilator. A ventilator is a machine that helps you breathe by moving air in and out of your lungs.  After surgery. What are the risks? Risks of using CHG include:  A skin reaction.  Hearing loss, if CHG gets in your ears.  Eye injury, if CHG gets in your eyes and is not rinsed out.  The CHG product catching fire. Make sure that you avoid smoking and flames after applying CHG to your skin. Do not use CHG:  If you have a chlorhexidine allergy or have previously reacted to chlorhexidine.  On babies younger than 65 months of age. How to use CHG solution  Use CHG only as told by your health care provider, and follow the instructions on the label.  Use the full amount of CHG as directed. Usually, this is one bottle. During a shower Follow these steps when using CHG solution during a shower (unless your health care provider gives you different instructions): 1. Start the shower. 2. Use your normal soap and shampoo to wash  your face and hair. 3. Turn off the shower or move out of the shower stream. 4. Pour the CHG onto a clean washcloth. Do not use any type of brush or rough-edged sponge. 5. Starting at your neck, lather your body down to your toes. Make sure you follow these instructions: ? If you will be having surgery, pay special attention to the part of your body where you will be having surgery. Scrub this area for at least 1 minute. ? Do not use CHG on your head or face. If the solution gets into your ears or eyes, rinse them well with water. ? Avoid your genital area. ? Avoid any areas of skin that have broken skin, cuts, or scrapes. ? Scrub your back and under your arms. Make sure to wash skin folds. 6. Let the lather sit on your skin for 1-2 minutes or as long as told by your health care provider. 7. Thoroughly rinse  your entire body in the shower. Make sure that all body creases and crevices are rinsed well. 8. Dry off with a clean towel. Do not put any substances on your body afterward--such as powder, lotion, or perfume--unless you are told to do so by your health care provider. Only use lotions that are recommended by the manufacturer. 9. Put on clean clothes or pajamas. 10. If it is the night before your surgery, sleep in clean sheets.  During a sponge bath Follow these steps when using CHG solution during a sponge bath (unless your health care provider gives you different instructions): 1. Use your normal soap and shampoo to wash your face and hair. 2. Pour the CHG onto a clean washcloth. 3. Starting at your neck, lather your body down to your toes. Make sure you follow these instructions: ? If you will be having surgery, pay special attention to the part of your body where you will be having surgery. Scrub this area for at least 1 minute. ? Do not use CHG on your head or face. If the solution gets into your ears or eyes, rinse them well with water. ? Avoid your genital area. ? Avoid any areas of  skin that have broken skin, cuts, or scrapes. ? Scrub your back and under your arms. Make sure to wash skin folds. 4. Let the lather sit on your skin for 1-2 minutes or as long as told by your health care provider. 5. Using a different clean, wet washcloth, thoroughly rinse your entire body. Make sure that all body creases and crevices are rinsed well. 6. Dry off with a clean towel. Do not put any substances on your body afterward--such as powder, lotion, or perfume--unless you are told to do so by your health care provider. Only use lotions that are recommended by the manufacturer. 7. Put on clean clothes or pajamas. 8. If it is the night before your surgery, sleep in clean sheets. How to use CHG prepackaged cloths  Only use CHG cloths as told by your health care provider, and follow the instructions on the label.  Use the CHG cloth on clean, dry skin.  Do not use the CHG cloth on your head or face unless your health care provider tells you to.  When washing with the CHG cloth: ? Avoid your genital area. ? Avoid any areas of skin that have broken skin, cuts, or scrapes. Before surgery Follow these steps when using a CHG cloth to clean before surgery (unless your health care provider gives you different instructions): 1. Using the CHG cloth, vigorously scrub the part of your body where you will be having surgery. Scrub using a back-and-forth motion for 3 minutes. The area on your body should be completely wet with CHG when you are done scrubbing. 2. Do not rinse. Discard the cloth and let the area air-dry. Do not put any substances on the area afterward, such as powder, lotion, or perfume. 3. Put on clean clothes or pajamas. 4. If it is the night before your surgery, sleep in clean sheets.  For general bathing Follow these steps when using CHG cloths for general bathing (unless your health care provider gives you different instructions). 1. Use a separate CHG cloth for each area of your  body. Make sure you wash between any folds of skin and between your fingers and toes. Wash your body in the following order, switching to a new cloth after each step: ? The front of your neck, shoulders, and chest. ?  Both of your arms, under your arms, and your hands. ? Your stomach and groin area, avoiding the genitals. ? Your right leg and foot. ? Your left leg and foot. ? The back of your neck, your back, and your buttocks. 2. Do not rinse. Discard the cloth and let the area air-dry. Do not put any substances on your body afterward--such as powder, lotion, or perfume--unless you are told to do so by your health care provider. Only use lotions that are recommended by the manufacturer. 3. Put on clean clothes or pajamas. Contact a health care provider if:  Your skin gets irritated after scrubbing.  You have questions about using your solution or cloth. Get help right away if:  Your eyes become very red or swollen.  Your eyes itch badly.  Your skin itches badly and is red or swollen.  Your hearing changes.  You have trouble seeing.  You have swelling or tingling in your mouth or throat.  You have trouble breathing.  You swallow any chlorhexidine. Summary  Chlorhexidine gluconate (CHG) is a germ-killing (antiseptic) solution that is used to clean the skin. Cleaning your skin with CHG may help to lower your risk for infection.  You may be given CHG to use for bathing. It may be in a bottle or in a prepackaged cloth to use on your skin. Carefully follow your health care provider's instructions and the instructions on the product label.  Do not use CHG if you have a chlorhexidine allergy.  Contact your health care provider if your skin gets irritated after scrubbing. This information is not intended to replace advice given to you by your health care provider. Make sure you discuss any questions you have with your health care provider. Document Revised: 03/25/2018 Document  Reviewed: 12/04/2016 Elsevier Patient Education  [place].

## 2019-09-03 LAB — SARS CORONAVIRUS 2 (TAT 6-24 HRS): SARS Coronavirus 2: NEGATIVE

## 2019-09-05 ENCOUNTER — Inpatient Hospital Stay (HOSPITAL_COMMUNITY): Payer: Medicare Other | Admitting: Hematology

## 2019-09-05 ENCOUNTER — Encounter (HOSPITAL_COMMUNITY)
Admission: RE | Disposition: A | Discharge status: Home / Self Care | Payer: Self-pay | Source: Home / Self Care | Attending: General Surgery

## 2019-09-05 ENCOUNTER — Ambulatory Visit (HOSPITAL_COMMUNITY): Payer: Medicare Other | Admitting: Anesthesiology

## 2019-09-05 ENCOUNTER — Ambulatory Visit (HOSPITAL_COMMUNITY): Payer: Medicare Other

## 2019-09-05 ENCOUNTER — Ambulatory Visit (HOSPITAL_COMMUNITY)
Admission: RE | Admit: 2019-09-05 | Discharge: 2019-09-05 | Disposition: A | Discharge status: Home / Self Care | Payer: Medicare Other | Attending: General Surgery | Admitting: General Surgery

## 2019-09-05 ENCOUNTER — Other Ambulatory Visit: Payer: Self-pay

## 2019-09-05 ENCOUNTER — Inpatient Hospital Stay (HOSPITAL_COMMUNITY): Payer: Medicare Other

## 2019-09-05 ENCOUNTER — Inpatient Hospital Stay (HOSPITAL_COMMUNITY): Payer: Medicare Other | Attending: Hematology

## 2019-09-05 ENCOUNTER — Encounter (HOSPITAL_COMMUNITY): Payer: Self-pay | Admitting: General Surgery

## 2019-09-05 VITALS — BP 163/90 | HR 94 | Temp 97.6°F | Resp 16

## 2019-09-05 VITALS — BP 146/93 | HR 94 | Temp 96.8°F | Resp 18 | Wt 190.8 lb

## 2019-09-05 DIAGNOSIS — C3492 Malignant neoplasm of unspecified part of left bronchus or lung: Secondary | ICD-10-CM | POA: Diagnosis not present

## 2019-09-05 DIAGNOSIS — C3490 Malignant neoplasm of unspecified part of unspecified bronchus or lung: Secondary | ICD-10-CM

## 2019-09-05 DIAGNOSIS — Z882 Allergy status to sulfonamides status: Secondary | ICD-10-CM | POA: Diagnosis not present

## 2019-09-05 DIAGNOSIS — Z79899 Other long term (current) drug therapy: Secondary | ICD-10-CM | POA: Diagnosis not present

## 2019-09-05 DIAGNOSIS — I10 Essential (primary) hypertension: Secondary | ICD-10-CM | POA: Insufficient documentation

## 2019-09-05 DIAGNOSIS — C7931 Secondary malignant neoplasm of brain: Secondary | ICD-10-CM | POA: Insufficient documentation

## 2019-09-05 DIAGNOSIS — C3432 Malignant neoplasm of lower lobe, left bronchus or lung: Secondary | ICD-10-CM | POA: Diagnosis not present

## 2019-09-05 DIAGNOSIS — F329 Major depressive disorder, single episode, unspecified: Secondary | ICD-10-CM | POA: Insufficient documentation

## 2019-09-05 DIAGNOSIS — C787 Secondary malignant neoplasm of liver and intrahepatic bile duct: Secondary | ICD-10-CM | POA: Insufficient documentation

## 2019-09-05 DIAGNOSIS — Z95828 Presence of other vascular implants and grafts: Secondary | ICD-10-CM

## 2019-09-05 DIAGNOSIS — K219 Gastro-esophageal reflux disease without esophagitis: Secondary | ICD-10-CM | POA: Insufficient documentation

## 2019-09-05 DIAGNOSIS — Z5111 Encounter for antineoplastic chemotherapy: Secondary | ICD-10-CM | POA: Insufficient documentation

## 2019-09-05 DIAGNOSIS — Z87891 Personal history of nicotine dependence: Secondary | ICD-10-CM | POA: Diagnosis not present

## 2019-09-05 DIAGNOSIS — M069 Rheumatoid arthritis, unspecified: Secondary | ICD-10-CM | POA: Insufficient documentation

## 2019-09-05 DIAGNOSIS — C3412 Malignant neoplasm of upper lobe, left bronchus or lung: Secondary | ICD-10-CM | POA: Diagnosis not present

## 2019-09-05 DIAGNOSIS — J90 Pleural effusion, not elsewhere classified: Secondary | ICD-10-CM | POA: Diagnosis not present

## 2019-09-05 HISTORY — PX: PORTACATH PLACEMENT: SHX2246

## 2019-09-05 LAB — CBC WITH DIFFERENTIAL/PLATELET
Abs Immature Granulocytes: 0.02 10*3/uL (ref 0.00–0.07)
Basophils Absolute: 0 10*3/uL (ref 0.0–0.1)
Basophils Relative: 0 %
Eosinophils Absolute: 0 10*3/uL (ref 0.0–0.5)
Eosinophils Relative: 0 %
HCT: 37.2 % (ref 36.0–46.0)
Hemoglobin: 11 g/dL — ABNORMAL LOW (ref 12.0–15.0)
Immature Granulocytes: 0 %
Lymphocytes Relative: 25 %
Lymphs Abs: 1.6 10*3/uL (ref 0.7–4.0)
MCH: 31.9 pg (ref 26.0–34.0)
MCHC: 29.6 g/dL — ABNORMAL LOW (ref 30.0–36.0)
MCV: 107.8 fL — ABNORMAL HIGH (ref 80.0–100.0)
Monocytes Absolute: 0.3 10*3/uL (ref 0.1–1.0)
Monocytes Relative: 4 %
Neutro Abs: 4.6 10*3/uL (ref 1.7–7.7)
Neutrophils Relative %: 71 %
Platelets: 293 10*3/uL (ref 150–400)
RBC: 3.45 MIL/uL — ABNORMAL LOW (ref 3.87–5.11)
RDW: 16 % — ABNORMAL HIGH (ref 11.5–15.5)
WBC: 6.5 10*3/uL (ref 4.0–10.5)
nRBC: 0 % (ref 0.0–0.2)

## 2019-09-05 LAB — COMPREHENSIVE METABOLIC PANEL
ALT: 16 U/L (ref 0–44)
AST: 31 U/L (ref 15–41)
Albumin: 3.4 g/dL — ABNORMAL LOW (ref 3.5–5.0)
Alkaline Phosphatase: 76 U/L (ref 38–126)
Anion gap: 11 (ref 5–15)
BUN: 14 mg/dL (ref 8–23)
CO2: 22 mmol/L (ref 22–32)
Calcium: 8.8 mg/dL — ABNORMAL LOW (ref 8.9–10.3)
Chloride: 103 mmol/L (ref 98–111)
Creatinine, Ser: 1.13 mg/dL — ABNORMAL HIGH (ref 0.44–1.00)
GFR calc Af Amer: 57 mL/min — ABNORMAL LOW (ref >60)
GFR calc non Af Amer: 49 mL/min — ABNORMAL LOW (ref >60)
Glucose, Bld: 138 mg/dL — ABNORMAL HIGH (ref 70–99)
Potassium: 4.4 mmol/L (ref 3.5–5.1)
Sodium: 136 mmol/L (ref 135–145)
Total Bilirubin: 0.4 mg/dL (ref 0.3–1.2)
Total Protein: 7.7 g/dL (ref 6.5–8.1)

## 2019-09-05 SURGERY — INSERTION, TUNNELED CENTRAL VENOUS DEVICE, WITH PORT
Anesthesia: General | Site: Chest | Laterality: Right

## 2019-09-05 MED ORDER — ORAL CARE MOUTH RINSE: 15.0000 mL | Freq: Once | OROMUCOSAL | Status: AC

## 2019-09-05 MED ORDER — CYANOCOBALAMIN 1000 MCG/ML IJ SOLN
1000.0000 ug | Freq: Once | INTRAMUSCULAR | Status: AC
  Administered 2019-09-05: 1000 ug via INTRAMUSCULAR
  Filled 2019-09-05: qty 1

## 2019-09-05 MED ORDER — SODIUM CHLORIDE 0.9 % IV SOLN: Freq: Once | INTRAVENOUS | Status: AC

## 2019-09-05 MED ORDER — ONDANSETRON HCL 4 MG/2ML IJ SOLN: 4.0000 mg | Freq: Once | INTRAMUSCULAR | Status: DC | PRN

## 2019-09-05 MED ORDER — SODIUM CHLORIDE (PF) 0.9 % IJ SOLN
INTRAMUSCULAR | Status: DC | PRN
  Administered 2019-09-05: 10 mL via INTRAVENOUS

## 2019-09-05 MED ORDER — PROPOFOL 10 MG/ML IV BOLUS
INTRAVENOUS | Status: DC | PRN
  Administered 2019-09-05: 20 mg via INTRAVENOUS

## 2019-09-05 MED ORDER — HEPARIN SOD (PORK) LOCK FLUSH 100 UNIT/ML IV SOLN
500.0000 [IU] | Freq: Once | INTRAVENOUS | Status: AC | PRN
  Administered 2019-09-05: 500 [IU]

## 2019-09-05 MED ORDER — HEPARIN SOD (PORK) LOCK FLUSH 100 UNIT/ML IV SOLN
INTRAVENOUS | Status: AC
  Filled 2019-09-05: qty 5

## 2019-09-05 MED ORDER — CHLORHEXIDINE GLUCONATE 0.12 % MT SOLN
15.0000 mL | Freq: Once | OROMUCOSAL | Status: AC
  Administered 2019-09-05: 15 mL via OROMUCOSAL

## 2019-09-05 MED ORDER — LIDOCAINE HCL (PF) 1 % IJ SOLN
INTRAMUSCULAR | Status: DC | PRN
  Administered 2019-09-05: 12 mL

## 2019-09-05 MED ORDER — FENTANYL CITRATE (PF) 100 MCG/2ML IJ SOLN: 25.0000 ug | INTRAMUSCULAR | Status: DC | PRN

## 2019-09-05 MED ORDER — KETAMINE HCL 50 MG/5ML IJ SOSY
PREFILLED_SYRINGE | INTRAMUSCULAR | Status: AC
  Filled 2019-09-05: qty 5

## 2019-09-05 MED ORDER — MEPERIDINE HCL 50 MG/ML IJ SOLN: 6.2500 mg | INTRAMUSCULAR | Status: DC | PRN

## 2019-09-05 MED ORDER — LACTATED RINGERS IV SOLN: INTRAVENOUS | Status: DC | PRN

## 2019-09-05 MED ORDER — CEFAZOLIN SODIUM-DEXTROSE 2-4 GM/100ML-% IV SOLN
2.0000 g | INTRAVENOUS | Status: AC
  Administered 2019-09-05: 2 g via INTRAVENOUS
  Filled 2019-09-05: qty 100

## 2019-09-05 MED ORDER — SODIUM CHLORIDE 0.9% FLUSH: 10.0000 mL | INTRAVENOUS | Status: DC | PRN

## 2019-09-05 MED ORDER — PROPOFOL 500 MG/50ML IV EMUL
INTRAVENOUS | Status: DC | PRN
  Administered 2019-09-05: 150 ug/kg/min via INTRAVENOUS

## 2019-09-05 MED ORDER — KETAMINE HCL 10 MG/ML IJ SOLN
INTRAMUSCULAR | Status: DC | PRN
Start: 2019-09-05 — End: 2019-09-05
  Administered 2019-09-05: 20 mg via INTRAVENOUS

## 2019-09-05 MED ORDER — CHLORHEXIDINE GLUCONATE CLOTH 2 % EX PADS: 6.0000 | MEDICATED_PAD | Freq: Once | CUTANEOUS | Status: DC

## 2019-09-05 MED ORDER — HEPARIN SOD (PORK) LOCK FLUSH 100 UNIT/ML IV SOLN
INTRAVENOUS | Status: DC | PRN
  Administered 2019-09-05: 500 [IU] via INTRAVENOUS

## 2019-09-05 MED ORDER — LACTATED RINGERS IV SOLN
Freq: Once | INTRAVENOUS | Status: AC
  Administered 2019-09-05: 1000 mL via INTRAVENOUS

## 2019-09-05 MED ORDER — SODIUM CHLORIDE 0.9 % IV SOLN
500.0000 mg/m2 | Freq: Once | INTRAVENOUS | Status: AC
  Administered 2019-09-05: 1000 mg via INTRAVENOUS
  Filled 2019-09-05: qty 40

## 2019-09-05 MED ORDER — ONDANSETRON HCL 4 MG/2ML IJ SOLN: 8.0000 mg | Freq: Once | INTRAMUSCULAR | Status: DC

## 2019-09-05 MED ORDER — LIDOCAINE HCL (PF) 1 % IJ SOLN
INTRAMUSCULAR | Status: AC
  Filled 2019-09-05: qty 30

## 2019-09-05 MED ORDER — SODIUM CHLORIDE 0.9 % IV SOLN
Freq: Once | INTRAVENOUS | Status: AC
  Administered 2019-09-05: 8 mg via INTRAVENOUS
  Filled 2019-09-05: qty 4

## 2019-09-05 SURGICAL SUPPLY — 33 items
ADH SKN CLS APL DERMABOND .7 (GAUZE/BANDAGES/DRESSINGS) ×1
APL PRP STRL LF ISPRP CHG 10.5 (MISCELLANEOUS) ×1
APPLICATOR CHLORAPREP 10.5 ORG (MISCELLANEOUS) ×2 IMPLANT
BAG DECANTER FOR FLEXI CONT (MISCELLANEOUS) ×2 IMPLANT
CLOTH BEACON ORANGE TIMEOUT ST (SAFETY) ×2 IMPLANT
COVER LIGHT HANDLE STERIS (MISCELLANEOUS) ×4 IMPLANT
COVER WAND RF STERILE (DRAPES) ×2 IMPLANT
DECANTER SPIKE VIAL GLASS SM (MISCELLANEOUS) ×2 IMPLANT
DERMABOND ADVANCED (GAUZE/BANDAGES/DRESSINGS) ×1
DERMABOND ADVANCED .7 DNX12 (GAUZE/BANDAGES/DRESSINGS) ×1 IMPLANT
DRAPE C-ARM FOLDED MOBILE STRL (DRAPES) ×2 IMPLANT
DRSG TEGADERM 4X4.75 (GAUZE/BANDAGES/DRESSINGS) ×2 IMPLANT
ELECT REM PT RETURN 9FT ADLT (ELECTROSURGICAL) ×2
ELECTRODE REM PT RTRN 9FT ADLT (ELECTROSURGICAL) ×1 IMPLANT
GLOVE BIO SURGEON STRL SZ7 (GLOVE) ×2 IMPLANT
GLOVE BIOGEL PI IND STRL 7.0 (GLOVE) ×2 IMPLANT
GLOVE BIOGEL PI INDICATOR 7.0 (GLOVE) ×2
GLOVE SURG SS PI 7.5 STRL IVOR (GLOVE) ×2 IMPLANT
GOWN STRL REUS W/TWL LRG LVL3 (GOWN DISPOSABLE) ×4 IMPLANT
IV NS 500ML (IV SOLUTION) ×2
IV NS 500ML BAXH (IV SOLUTION) ×1 IMPLANT
KIT PORT POWER 8FR ISP MRI (Port) ×2 IMPLANT
KIT TURNOVER KIT A (KITS) ×2 IMPLANT
NEEDLE HYPO 25X1 1.5 SAFETY (NEEDLE) ×2 IMPLANT
PACK MINOR (CUSTOM PROCEDURE TRAY) ×2 IMPLANT
PAD ARMBOARD 7.5X6 YLW CONV (MISCELLANEOUS) ×2 IMPLANT
SET BASIN LINEN APH (SET/KITS/TRAYS/PACK) ×2 IMPLANT
SPONGE GAUZE 2X2 8PLY STRL LF (GAUZE/BANDAGES/DRESSINGS) ×2 IMPLANT
SUT MNCRL AB 4-0 PS2 18 (SUTURE) ×2 IMPLANT
SUT VIC AB 3-0 SH 27 (SUTURE) ×2
SUT VIC AB 3-0 SH 27X BRD (SUTURE) ×1 IMPLANT
SYR 5ML LL (SYRINGE) ×2 IMPLANT
SYR CONTROL 10ML LL (SYRINGE) ×2 IMPLANT

## 2019-09-05 NOTE — Anesthesia Preprocedure Evaluation (Signed)
Anesthesia Evaluation  Patient identified by MRN, date of birth, ID band Patient awake    Reviewed: Allergy & Precautions, NPO status , Patient's Chart, lab work & pertinent test results  History of Anesthesia Complications Negative for: history of anesthetic complications  Airway Mallampati: II  TM Distance: >3 FB Neck ROM: Full    Dental  (+) Edentulous Upper, Edentulous Lower   Pulmonary former smoker,  Metastatic left lung cancer   Pulmonary exam normal breath sounds clear to auscultation       Cardiovascular Exercise Tolerance: Good hypertension, Pt. on medications Normal cardiovascular exam Rhythm:Regular Rate:Normal  The follow-up echocardiogram shows normal LVEF at 60 to 92%, mild diastolic dysfunction.  Also normal right ventricular contraction.  Please check with her and see how she is doing on the diuretic that was prescribed by her PCP.   Neuro/Psych PSYCHIATRIC DISORDERS Depression    GI/Hepatic Neg liver ROS, GERD  Medicated,  Endo/Other  negative endocrine ROS  Renal/GU negative Renal ROS     Musculoskeletal  (+) Arthritis , Osteoarthritis and Rheumatoid disorders,    Abdominal   Peds  Hematology negative hematology ROS (+)   Anesthesia Other Findings   Reproductive/Obstetrics                             Anesthesia Physical Anesthesia Plan  ASA: IV  Anesthesia Plan: General   Post-op Pain Management:    Induction: Intravenous  PONV Risk Score and Plan: Ondansetron, Dexamethasone and Midazolam  Airway Management Planned: Nasal Cannula, Natural Airway and Simple Face Mask  Additional Equipment:   Intra-op Plan:   Post-operative Plan:   Informed Consent: I have reviewed the patients History and Physical, chart, labs and discussed the procedure including the risks, benefits and alternatives for the proposed anesthesia with the patient or authorized representative  who has indicated his/her understanding and acceptance.       Plan Discussed with: CRNA and Anesthesiologist  Anesthesia Plan Comments:         Anesthesia Quick Evaluation

## 2019-09-05 NOTE — Discharge Instructions (Signed)
Implanted [facility] Guide An implanted port is a device that is placed under the skin. It is usually placed in the chest. The device can be used to give IV medicine, to take blood, or for dialysis. You may have an implanted port if:  You need IV medicine that would be irritating to the small veins in your hands or arms.  You need IV medicines, such as antibiotics, for a long period of time.  You need IV nutrition for a long period of time.  You need dialysis. Having a port means that your health care provider will not need to use the veins in your arms for these procedures. You may have fewer limitations when using a port than you would if you used other types of long-term IVs, and you will likely be able to return to normal activities after your incision heals. An implanted port has two main parts:  Reservoir. The reservoir is the part where a needle is inserted to give medicines or draw blood. The reservoir is round. After it is placed, it appears as a small, raised area under your skin.  Catheter. The catheter is a thin, flexible tube that connects the reservoir to a vein. Medicine that is inserted into the reservoir goes into the catheter and then into the vein. How is my port accessed? To access your port:  A numbing cream may be placed on the skin over the port site.  Your health care provider will put on a mask and sterile gloves.  The skin over your port will be cleaned carefully with a germ-killing soap and allowed to dry.  Your health care provider will gently pinch the port and insert a needle into it.  Your health care provider will check for a blood return to make sure the port is in the vein and is not clogged.  If your port needs to remain accessed to get medicine continuously (constant infusion), your health care provider will place a clear bandage (dressing) over the needle site. The dressing and needle will need to be changed every week, or as told by your health care  provider. What is flushing? Flushing helps keep the port from getting clogged. Follow instructions from your health care provider about how and when to flush the port. Ports are usually flushed with saline solution or a medicine called heparin. The need for flushing will depend on how the port is used:  If the port is only used from time to time to give medicines or draw blood, the port may need to be flushed: ? Before and after medicines have been given. ? Before and after blood has been drawn. ? As part of routine maintenance. Flushing may be recommended every 4-6 weeks.  If a constant infusion is running, the port may not need to be flushed.  Throw away any syringes in a disposal container that is meant for sharp items (sharps container). You can buy a sharps container from a pharmacy, or you can make one by using an empty hard plastic bottle with a cover. How long will my port stay implanted? The port can stay in for as long as your health care provider thinks it is needed. When it is time for the port to come out, a surgery will be done to remove it. The surgery will be similar to the procedure that was done to put the port in. Follow these instructions at home:   Flush your port as told by your health care provider.  If you need an infusion over several days, follow instructions from your health care provider about how to take care of your port site. Make sure you: ? Wash your hands with soap and water before you change your dressing. If soap and water are not available, use alcohol-based hand sanitizer. ? Change your dressing as told by your health care provider. ? Place any used dressings or infusion bags into a plastic bag. Throw that bag in the trash. ? Keep the dressing that covers the needle clean and dry. Do not get it wet. ? Do not use scissors or sharp objects near the tube. ? Keep the tube clamped, unless it is being used.  Check your port site every day for signs of  infection. Check for: ? Redness, swelling, or pain. ? Fluid or blood. ? Pus or a bad smell.  Protect the skin around the port site. ? Avoid wearing bra straps that rub or irritate the site. ? Protect the skin around your port from seat belts. Place a soft pad over your chest if needed.  Bathe or shower as told by your health care provider. The site may get wet as long as you are not actively receiving an infusion.  Return to your normal activities as told by your health care provider. Ask your health care provider what activities are safe for you.  Carry a medical alert card or wear a medical alert bracelet at all times. This will let health care providers know that you have an implanted port in case of an emergency. Get help right away if:  You have redness, swelling, or pain at the port site.  You have fluid or blood coming from your port site.  You have pus or a bad smell coming from the port site.  You have a fever. Summary  Implanted ports are usually placed in the chest for long-term IV access.  Follow instructions from your health care provider about flushing the port and changing bandages (dressings).  Take care of the area around your port by avoiding clothing that puts pressure on the area, and by watching for signs of infection.  Protect the skin around your port from seat belts. Place a soft pad over your chest if needed.  Get help right away if you have a fever or you have redness, swelling, pain, drainage, or a bad smell at the port site. This information is not intended to replace advice given to you by your health care provider. Make sure you discuss any questions you have with your health care provider. Document Revised: 04/30/2018 Document Reviewed: 02/09/2016 Elsevier Patient Education  2020 [place] After These instructions provide you with information about caring for yourself after your procedure. Your health care  provider may also give you more specific instructions. Your treatment has been planned according to current medical practices, but problems sometimes occur. Call your health care provider if you have any problems or questions after your procedure. What can I expect after the procedure? After your procedure, you may:  Feel sleepy for several hours.  Feel clumsy and have poor balance for several hours.  Feel forgetful about what happened after the procedure.  Have poor judgment for several hours.  Feel nauseous or vomit.  Have a sore throat if you had a breathing tube during the procedure. Follow these instructions at home: For at least 24 hours after the procedure:      Have a responsible adult stay with  you. It is important to have someone help care for you until you are awake and alert.  Rest as needed.  Do not: ? Participate in activities in which you could fall or become injured. ? Drive. ? Use heavy machinery. ? Drink alcohol. ? Take sleeping pills or medicines that cause drowsiness. ? Make important decisions or sign legal documents. ? Take care of children on your own. Eating and drinking  Follow the diet that is recommended by your health care provider.  If you vomit, drink water, juice, or soup when you can drink without vomiting.  Make sure you have little or no nausea before eating solid foods. General instructions  Take over-the-counter and prescription medicines only as told by your health care provider.  If you have sleep apnea, surgery and certain medicines can increase your risk for breathing problems. Follow instructions from your health care provider about wearing your sleep device: ? Anytime you are sleeping, including during daytime naps. ? While taking prescription pain medicines, sleeping medicines, or medicines that make you drowsy.  If you smoke, do not smoke without supervision.  Keep all follow-up visits as told by your health care provider.  This is important. Contact a health care provider if:  You keep feeling nauseous or you keep vomiting.  You feel light-headed.  You develop a rash.  You have a fever. Get help right away if:  You have trouble breathing. Summary  For several hours after your procedure, you may feel sleepy and have poor judgment.  Have a responsible adult stay with you for at least 24 hours or until you are awake and alert. This information is not intended to replace advice given to you by your health care provider. Make sure you discuss any questions you have with your health care provider. Document Revised: 04/06/2017 Document Reviewed: 04/29/2015 Elsevier Patient Education  [place] After These instructions provide you with information about caring for yourself after your procedure. Your health care provider may also give you more specific instructions. Your treatment has been planned according to current medical practices, but problems sometimes occur. Call your health care provider if you have any problems or questions after your procedure. What can I expect after the procedure? After your procedure, you may:  Feel sleepy for several hours.  Feel clumsy and have poor balance for several hours.  Feel forgetful about what happened after the procedure.  Have poor judgment for several hours.  Feel nauseous or vomit.  Have a sore throat if you had a breathing tube during the procedure. Follow these instructions at home: For at least 24 hours after the procedure:      Have a responsible adult stay with you. It is important to have someone help care for you until you are awake and alert.  Rest as needed.  Do not: ? Participate in activities in which you could fall or become injured. ? Drive. ? Use heavy machinery. ? Drink alcohol. ? Take sleeping pills or medicines that cause drowsiness. ? Make important decisions or sign legal  documents. ? Take care of children on your own. Eating and drinking  Follow the diet that is recommended by your health care provider.  If you vomit, drink water, juice, or soup when you can drink without vomiting.  Make sure you have little or no nausea before eating solid foods. General instructions  Take over-the-counter and prescription medicines only as told by your health  care provider.  If you have sleep apnea, surgery and certain medicines can increase your risk for breathing problems. Follow instructions from your health care provider about wearing your sleep device: ? Anytime you are sleeping, including during daytime naps. ? While taking prescription pain medicines, sleeping medicines, or medicines that make you drowsy.  If you smoke, do not smoke without supervision.  Keep all follow-up visits as told by your health care provider. This is important. Contact a health care provider if:  You keep feeling nauseous or you keep vomiting.  You feel light-headed.  You develop a rash.  You have a fever. Get help right away if:  You have trouble breathing. Summary  For several hours after your procedure, you may feel sleepy and have poor judgment.  Have a responsible adult stay with you for at least 24 hours or until you are awake and alert. This information is not intended to replace advice given to you by your health care provider. Make sure you discuss any questions you have with your health care provider. Document Revised: 04/06/2017 Document Reviewed: 04/29/2015 Elsevier Patient Education  [place].

## 2019-09-05 NOTE — Progress Notes (Signed)
1247 Labs reviewed with and pt seen by [name] and pt approved for Alimta infusion today per MD

## 2019-09-05 NOTE — Anesthesia Postprocedure Evaluation (Signed)
Anesthesia Post Note  Patient: Rose Lutz  Procedure(s) Performed: INSERTION PORT-A-CATH (Right Chest)  Patient location during evaluation: PACU Anesthesia Type: General and MAC Level of consciousness: awake Pain management: pain level controlled Vital Signs Assessment: post-procedure vital signs reviewed and stable Respiratory status: spontaneous breathing Cardiovascular status: stable Postop Assessment: no apparent nausea or vomiting Anesthetic complications: no   No complications documented.   Last Vitals:  Vitals:   09/05/19 1005 09/05/19 1009  BP:  (!) 145/90  Pulse: 95 95  Resp: 14 19  Temp:    SpO2: 94% 95%    Last Pain:  Vitals:   09/05/19 1009  TempSrc:   PainSc: 0-No pain                 Camile Esters H[place]

## 2019-09-05 NOTE — Progress Notes (Signed)
Rose Lutz tolerated treatment well today without incidence today. Discharged via wheelchair with husband today.

## 2019-09-05 NOTE — Progress Notes (Signed)
Patient has been assessed by [name], labs reviewed. He is okay for her to proceed with treatment today.

## 2019-09-05 NOTE — Op Note (Signed)
Patient:  Rose Lutz  DOB:  October 31, 1950  MRN:  [id number]   Preop Diagnosis: Non-small cell carcinoma of left lung  Postop Diagnosis: Same  Procedure: Port-A-Cath insertion  Surgeon: Aviva Signs, MD  Anes: MAC  Indications: Patient is a 69 year old white female who presents for Port-A-Cath placement.  She is undergoing chemotherapy for left lung carcinoma.  The risks and benefits of the procedure including bleeding, infection, and pneumothorax were fully explained to the patient, who gave informed consent.  Procedure note: The patient was placed in the Trendelenburg position after the right upper chest was prepped and draped using the usual sterile technique with ChloraPrep.  Surgical site confirmation was performed.  1% Xylocaine was used for local anesthesia.  An incision was made below the right clavicle.  A subcutaneous pocket was formed.  A needle was advanced into the right subclavian vein using the Seldinger technique without difficulty.  A guidewire was then passed into the right atrium under fluoroscopic guidance.  An introducer and peel-away sheath were placed over the guidewire.  The catheter was inserted through the peel-away sheath and the peel-away sheath was removed.  The catheter was then attached to the port and the port placed in subcutaneous pocket.  Adequate positioning was confirmed by fluoroscopy.  Good backflow of venous blood was noted on aspiration of the port.  The port was left accessed.  The port was flushed with saline.  The subcutaneous layer was reapproximated using a 3-0 Vicryl interrupted suture.  The skin was closed using a 4-0 Monocryl subcuticular suture.  Dermabond was applied.  All tape and needle counts were correct at the end of the procedure.  The patient was awakened and transferred to PACU in stable condition.  A chest x-ray will be performed at that time.  Complications: None  EBL: Minimal  Specimen: None

## 2019-09-05 NOTE — Patient Instructions (Signed)
[place] at [facility] Discharge Instructions  You were seen today by [name].  You received your treatment. We will see you back in 3 weeks for next treatment and labs.  We will get scans from [facility] and get our radiologist to compare to your scans here.   Thank you for choosing [place] at [facility] to provide your oncology and hematology care.  To afford each patient quality time with our provider, please arrive at least 15 minutes before your scheduled appointment time.   If you have a lab appointment with the [place] please come in thru the Main Entrance and check in at the main information desk.  You need to re-schedule your appointment should you arrive 10 or more minutes late.  We strive to give you quality time with our providers, and arriving late affects you and other patients whose appointments are after yours.  Also, if you no show three or more times for appointments you may be dismissed from the clinic at the providers discretion.     Again, thank you for choosing [facility].  Our hope is that these requests will decrease the amount of time that you wait before being seen by our physicians.       _____________________________________________________________  Should you have questions after your visit to [facility], please contact our office at [phone] and follow the prompts.  Our office hours are 8:00 a.m. and 4:30 p.m. Monday - Friday.  Please note that voicemails left after 4:00 p.m. may not be returned until the following business day.  We are closed weekends and major holidays.  You do have access to a nurse 24-7, just call the main number to the clinic [phone] and do not press any options, hold on the line and a nurse will answer the phone.    For prescription refill requests, have your pharmacy contact our office and allow 72 hours.    Due to Covid, you will need  to wear a mask upon entering the hospital. If you do not have a mask, a mask will be given to you at the Main Entrance upon arrival. For doctor visits, patients may have 1 support person age 8 or older with them. For treatment visits, patients can not have anyone with them due to social distancing guidelines and our immunocompromised population.

## 2019-09-05 NOTE — Interval H&P Note (Signed)
History and Physical Interval Note:  09/05/2019 8:30 AM  Rose Lutz  has presented today for surgery, with the diagnosis of Lung cancer.  The various methods of treatment have been discussed with the patient and family. After consideration of risks, benefits and other options for treatment, the patient has consented to  Procedure(s): INSERTION PORT-A-CATH (Right) as a surgical intervention.  The patient's history has been reviewed, patient examined, no change in status, stable for surgery.  I have reviewed the patient's chart and labs.  Questions were answered to the patient's satisfaction.     Aviva Signs

## 2019-09-05 NOTE — Patient Instructions (Signed)
[facility] Discharge Instructions for Patients Receiving Chemotherapy  Today you received the following chemotherapy agents   To help prevent nausea and vomiting after your treatment, we encourage you to take your nausea medication   If you develop nausea and vomiting that is not controlled by your nausea medication, call the clinic.   BELOW ARE SYMPTOMS THAT SHOULD BE REPORTED IMMEDIATELY:  *FEVER GREATER THAN 100.5 F  *CHILLS WITH OR WITHOUT FEVER  NAUSEA AND VOMITING THAT IS NOT CONTROLLED WITH YOUR NAUSEA MEDICATION  *UNUSUAL SHORTNESS OF BREATH  *UNUSUAL BRUISING OR BLEEDING  TENDERNESS IN MOUTH AND THROAT WITH OR WITHOUT PRESENCE OF ULCERS  *URINARY PROBLEMS  *BOWEL PROBLEMS  UNUSUAL RASH Items with * indicate a potential emergency and should be followed up as soon as possible.  Feel free to call the clinic should you have any questions or concerns. The clinic phone number is [phone].  Please show the CHEMO ALERT CARD at check-in to the Emergency Department and triage nurse.   

## 2019-09-05 NOTE — Transfer of Care (Signed)
Immediate Anesthesia Transfer of Care Note  Patient: Rose Lutz  Procedure(s) Performed: INSERTION PORT-A-CATH (Right Chest)  Patient Location: PACU  Anesthesia Type:MAC  Level of Consciousness: awake, alert  and oriented  Airway & Oxygen Therapy: Patient Spontanous Breathing  Post-op Assessment: Report given to RN and Post -op Vital signs reviewed and stable  Post vital signs: Reviewed and stable  Last Vitals:  Vitals Value Taken Time  BP 148/89 09/05/19 0945  Temp    Pulse 93 09/05/19 0948  Resp 15 09/05/19 0948  SpO2 91 % 09/05/19 0948  Vitals shown include unvalidated device data.  Last Pain:  Vitals:   09/05/19 0753  TempSrc: Oral  PainSc: 3       Patients Stated Pain Goal: 5 (75/30/05 1102)  Complications: No complications documented.

## 2019-09-05 NOTE — Progress Notes (Signed)
Rose Lutz, [place] [address]   CLINIC:  Medical Oncology/Hematology  PCP:  Scherrie Bateman [address] / [place] [address] [phone]   REASON FOR VISIT:  Follow-up for metastatic bilateral non-small cell lung cancer to liver and brain  PRIOR THERAPY:  1. Platinum and pemetrexed x 4 cycles from 04/02/2017 to 06/08/2017. 2. Left upper lobectomy on 07/08/2017. 3. Carbo/Pem/Pembro x 4 cycles from 09/20/2018 to 12/06/2018. 4. Pembrolizumab maintenance from 01/03/2019 to 04/25/2019.  NGS Results: Pending  CURRENT THERAPY: Pemetrexed every 3 weeks  BRIEF ONCOLOGIC HISTORY:  Oncology History  Metastatic non-small cell lung cancer ([facility])  08/19/2019 Initial Diagnosis   Metastatic non-small cell lung cancer ([facility])   09/06/2019 -  Chemotherapy   The patient had ondansetron (ZOFRAN) injection 8 mg, 8 mg (original dose ), Intravenous,  Once, 0 of 3 cycles Dose modification: 8 mg (Cycle 1) PEMEtrexed (ALIMTA) 1,000 mg in sodium chloride 0.9 % 100 mL chemo infusion, 500 mg/m2 = 1,000 mg, Intravenous,  Once, 0 of 3 cycles  for chemotherapy treatment.      CANCER STAGING: Cancer Staging Primary malignant neoplasm of left upper lobe of lung ([facility]) Staging form: Lung, AJCC 8th Edition - Clinical stage from 01/22/2017: Stage IIIA (cT4, cN0, cM0) - Unsigned   INTERVAL HISTORY:  Rose Lutz, a 69 y.o. female, returns for routine follow-up and consideration for next cycle of chemotherapy. Rose Lutz was last seen on 08/19/2019.  Due for initiating cycle #1 of pemetrexed today.   Overall, she tells me she has been feeling pretty well. She continues having SOB with exertion. She will see her rheumatologist on 9/1 to restart her Remecaide.  Overall, she feels ready for next cycle of chemo today.    REVIEW OF SYSTEMS:  Review of Systems  Constitutional: Positive for appetite change (depleted) and fatigue (depleted).    Respiratory: Positive for cough and shortness of breath (w/ exertion).   Gastrointestinal: Positive for constipation, diarrhea, nausea and vomiting.  Genitourinary: Positive for frequency.   All other systems reviewed and are negative.   PAST MEDICAL/SURGICAL HISTORY:  Past Medical History:  Diagnosis Date  . Chronic pain   . Depression   . Essential hypertension   . Family history of breast cancer   . Family history of colon cancer   . Family history of pancreatic cancer   . GERD (gastroesophageal reflux disease)   . History of lung cancer 06/2017  . Osteoarthritis   . Pulmonary asbestosis ([facility])   . Rheumatoid arthritis [facility])    Past Surgical History:  Procedure Laterality Date  . ABDOMINAL HYSTERECTOMY    . BILATERAL OOPHORECTOMY    . CESAREAN SECTION     x3  . FEMUR FRACTURE SURGERY    . KNEE ARTHROPLASTY Right   . LUNG CANCER SURGERY Left 07/08/2017  . pelvis fracture/surgery     Dr. Cheri Rous  . TONSILLECTOMY    . VIDEO BRONCHOSCOPY WITH ENDOBRONCHIAL NAVIGATION Left 12/31/2016   Procedure: VIDEO BRONCHOSCOPY WITH ENDOBRONCHIAL NAVIGATION, left upper lung lobe;  Surgeon: Collene Gobble, MD;  Location: [facility];  Service: Thoracic;  Laterality: Left;  . YAG LASER APPLICATION Right 70/01/7492   Procedure: YAG LASER APPLICATION;  Surgeon: Rutherford Guys, MD;  Location: [facility];  Service: Ophthalmology;  Laterality: Right;  . YAG LASER APPLICATION Left 49/67/5916   Procedure: YAG LASER APPLICATION;  Surgeon: Rutherford Guys, MD;  Location: [facility];  Service: Ophthalmology;  Laterality: Left;  SOCIAL HISTORY:  Social History   Socioeconomic History  . Marital status: Married    Spouse name: Not on file  . Number of children: 3  . Years of education: Not on file  . Highest education level: Not on file  Occupational History  . Occupation: DISABLED  Tobacco Use  . Smoking status: Former Smoker    Packs/day: 1.25    Years: 38.00    Pack years: 47.50    Types: Cigarettes     Start date: 01/21/1968    Quit date: 01/20/2006    Years since quitting: 13.6  . Smokeless tobacco: Never Used  Vaping Use  . Vaping Use: Never used  Substance and Sexual Activity  . Alcohol use: No    Alcohol/week: 0.0 standard drinks  . Drug use: No  . Sexual activity: Not Currently  Other Topics Concern  . Not on file  Social History Narrative  . Not on file   Social Determinants of Health   Financial Resource Strain: Medium Risk  . Difficulty of Paying Living Expenses: Somewhat hard  Food Insecurity: No Food Insecurity  . Worried About Charity fundraiser in the Last Year: Never true  . Ran Out of Food in the Last Year: Never true  Transportation Needs: No Transportation Needs  . Lack of Transportation (Medical): No  . Lack of Transportation (Non-Medical): No  Physical Activity: Inactive  . Days of Exercise per Week: 0 days  . Minutes of Exercise per Session: 0 min  Stress: Stress Concern Present  . Feeling of Stress : To some extent  Social Connections: Moderately Isolated  . Frequency of Communication with Friends and Family: Twice a week  . Frequency of Social Gatherings with Friends and Family: Never  . Attends Religious Services: 1 to 4 times per year  . Active Member of Clubs or Organizations: No  . Attends Archivist Meetings: Never  . Marital Status: Married  Human resources officer Violence: Not At Risk  . Fear of Current or Ex-Partner: No  . Emotionally Abused: No  . Physically Abused: No  . Sexually Abused: No    FAMILY HISTORY:  Family History  Problem Relation Age of Onset  . Breast cancer Mother 34       d. 97  . Colon cancer Maternal Grandmother 46       d. 58  . Pancreatic cancer Son 26  . Healthy Father   . Arthritis Sister   . Arthritis Paternal Grandmother   . Other Son 40       MVA  . Esophageal cancer Maternal Aunt     CURRENT MEDICATIONS:  Current Outpatient Medications  Medication Sig Dispense Refill  . albuterol (VENTOLIN  HFA) 108 (90 Base) MCG/ACT inhaler Inhale 1-2 puffs into the lungs every 6 (six) hours as needed for wheezing or shortness of breath.     Marland Kitchen alendronate (FOSAMAX) 35 MG tablet Take 35 mg by mouth every 7 (seven) days.     Marland Kitchen amitriptyline (ELAVIL) 100 MG tablet Take 100 mg by mouth at bedtime.    . Ascorbic Acid (VITAMIN C) 250 MG CHEW Chew 250 mg by mouth daily.     . cetirizine (ZYRTEC) 10 MG tablet Take 10 mg by mouth daily as needed for allergies.     . diphenhydrAMINE (BENADRYL) 25 mg capsule Take 25 mg by mouth every 6 (six) hours as needed for itching or allergies.     Marland Kitchen gabapentin (NEURONTIN) 100 MG capsule Take 100  mg by mouth 3 (three) times daily.   5  . hydrALAZINE (APRESOLINE) 25 MG tablet Take 25 mg by mouth daily.     Marland Kitchen HYDROcodone-acetaminophen (NORCO) 10-325 MG tablet Take 1 tablet by mouth every 6 (six) hours as needed for moderate pain.     . hydroxychloroquine (PLAQUENIL) 200 MG tablet TAKE 2 TABLETS BY MOUTH  EVERY EVENING. [place] (Patient taking differently: Take 400 mg by mouth every Monday, Tuesday, Wednesday, Thursday, and Friday. ) 120 tablet 0  . leflunomide (ARAVA) 10 MG tablet TAKE 2 TABLETS BY MOUTH  DAILY (Patient taking differently: Take 20 mg by mouth daily. ) 60 tablet 0  . losartan-hydrochlorothiazide (HYZAAR) 50-12.5 MG tablet Take 1 tablet by mouth daily.    . Multiple Vitamin (MULTIVITAMIN WITH MINERALS) TABS tablet Take 1 tablet by mouth daily.    Marland Kitchen omeprazole (PRILOSEC) 20 MG capsule Take 20 mg by mouth daily.    . potassium chloride (MICRO-K) 10 MEQ CR capsule Take 10 mEq by mouth 2 (two) times daily.     Marland Kitchen senna (SENOKOT) 8.6 MG tablet Take 1 tablet by mouth at bedtime.     . Vitamin D, Ergocalciferol, (DRISDOL) 50000 units CAPS capsule Take 1 capsule (50,000 Units total) by mouth every 7 (seven) days. 12 capsule 0  . folic acid (FOLVITE) 1 MG tablet Take 1 tablet (1 mg total) by mouth daily. 90 tablet 4  . ondansetron (ZOFRAN-ODT) 4 MG  disintegrating tablet Take 4 mg by mouth every 8 (eight) hours as needed for nausea or vomiting.  (Patient not taking: Reported on 09/05/2019)    . prochlorperazine (COMPAZINE) 10 MG tablet Take 10 mg by mouth every 6 (six) hours as needed for nausea or vomiting.  (Patient not taking: Reported on 09/05/2019)     No current facility-administered medications for this visit.   Facility-Administered Medications Ordered in Other Visits  Medication Dose Route Frequency Provider Last Rate Last Admin  . cyanocobalamin ((VITAMIN B-12)) injection 1,000 mcg  1,000 mcg Intramuscular Once Derek Jack, MD        ALLERGIES:  Allergies  Allergen Reactions  . Quinine Derivatives Nausea And Vomiting  . Sulfa Antibiotics Nausea And Vomiting  . Percocet [Oxycodone-Acetaminophen] Other (See Comments)    Headahces    PHYSICAL EXAM:  Performance status (ECOG): 2 - Symptomatic, <50% confined to bed  Vitals:   09/05/19 1151  BP: (!) 146/93  Pulse: 94  Resp: 18  Temp: (!) 96.8 F (36 C)  SpO2: 100%   Wt Readings from Last 3 Encounters:  09/05/19 190 lb 12.8 oz (86.5 kg)  09/01/19 197 lb (89.4 kg)  08/19/19 197 lb 8 oz (89.6 kg)   Physical Exam Vitals reviewed.  Constitutional:      Appearance: Normal appearance.  Cardiovascular:     Rate and Rhythm: Normal rate and regular rhythm.     Pulses: Normal pulses.  Pulmonary:     Effort: Pulmonary effort is normal.     Breath sounds: Normal breath sounds.  Chest:     Comments: Port-a-Cath on R chest Musculoskeletal:     Right lower leg: Edema (1+) present.     Left lower leg: Edema (1+) present.  Neurological:     General: No focal deficit present.     Mental Status: She is alert and oriented to person, place, and time.  Psychiatric:        Mood and Affect: Mood normal.        Behavior:  Behavior normal.     LABORATORY DATA:  I have reviewed the labs as listed.  CBC Latest Ref Rng & Units 09/05/2019 08/19/2019 05/27/2018  WBC 4.0 -  10.5 K/uL 6.5 9.5 7.3  Hemoglobin 12.0 - 15.0 g/dL 11.0(L) 9.7(L) 10.4(L)  Hematocrit 36 - 46 % 37.2 32.5(L) 31.7(L)  Platelets 150 - 400 K/uL 293 279 225   CMP Latest Ref Rng & Units 09/05/2019 08/19/2019 05/27/2018  Glucose 70 - 99 mg/dL 138(H) 95 103(H)  BUN 8 - 23 mg/dL 14 19 21   Creatinine 0.44 - 1.00 mg/dL 1.13(H) 1.18(H) 1.33(H)  Sodium 135 - 145 mmol/L 136 139 142  Potassium 3.5 - 5.1 mmol/L 4.4 4.6 3.3(L)  Chloride 98 - 111 mmol/L 103 104 101  CO2 22 - 32 mmol/L 22 25 25   Calcium 8.9 - 10.3 mg/dL 8.8(L) 9.1 9.5  Total Protein 6.5 - 8.1 g/dL 7.7 7.0 6.5  Total Bilirubin 0.3 - 1.2 mg/dL 0.4 0.5 0.2  Alkaline Phos 38 - 126 U/L 76 75 119(H)  AST 15 - 41 U/L 31 47(H) 26  ALT 0 - 44 U/L 16 27 18     DIAGNOSTIC IMAGING:  I have independently reviewed the scans and discussed with the patient. CT Chest W Contrast  Result Date: 09/03/2019 CLINICAL DATA:  History of left lung cancer 2019 status post chemotherapy and radiation. Follow-up exam. EXAM: CT CHEST, ABDOMEN, AND PELVIS WITH CONTRAST TECHNIQUE: Multidetector CT imaging of the chest, abdomen and pelvis was performed following the standard protocol during bolus administration of intravenous contrast. CONTRAST:  173mL OMNIPAQUE IOHEXOL 300 MG/ML  SOLN COMPARISON:  PET-CT 08/09/2018; CT abdomen pelvis 07/23/2018 FINDINGS: CT CHEST FINDINGS Cardiovascular: Normal heart size. Thoracic aortic and coronary arterial vascular calcifications. Mediastinum/Nodes: Interval development of a 1.1 cm right paratracheal node (image 16; series 2). Normal appearance of the esophagus. No axillary lymphadenopathy. Lungs/Pleura: Patient status post left upper lobectomy. Centrilobular and paraseptal emphysematous changes. New 3 mm subpleural right middle lobe nodule (image 85; series 4). Interval development of a 5.3 x 2.2 cm area of masslike consolidation within the left lower lobe (image 76; series 4). The previously described nodular area of consolidation within  the subpleural left lower lobe has nearly resolved. Small left pleural effusion. Musculoskeletal: Thoracic spine degenerative changes. Mild wedge compression deformity of the T4 vertebral body (image 104; series 6). CT ABDOMEN PELVIS FINDINGS Hepatobiliary: Interval increase in size of right hepatic lobe lesion measuring 5.7 x 4.6 cm (image 55; series 2), previously 2.9 x 1.9 cm. There is an additional 1.6 x 1.3 cm low-attenuation lesion medial posterior right hepatic lobe (image 50; series 2). Gallbladder is unremarkable. No intrahepatic or extrahepatic biliary ductal dilatation. Pancreas: Unremarkable Spleen: Unremarkable Adrenals/Urinary Tract: Normal adrenal glands. Atrophic left kidney. No right-sided hydronephrosis. Urinary bladder is mildly distended. Stomach/Bowel: No abnormal bowel wall thickening or evidence for bowel obstruction. No free fluid or free intraperitoneal air. Normal morphology of the stomach. Vascular/Lymphatic: There is a 1.5 cm porta hepatic node (image 56; series 2). New 0.9 cm preaortic node (image 63; series 2). Peripheral calcified atherosclerotic plaque involving the abdominal aorta. Reproductive: Prior hysterectomy. Other: None. Musculoskeletal: Interval development of a fractures through the inferior and superior left pubic ramus (image 119; series 2). There appears to be some associated soft tissue and the possibility of pathologic fractures are not excluded. Interval development of left hemi sacral insufficiency fractures. Lumbar spine degenerative changes. IMPRESSION: 1. Interval development of a 5.3 cm area of masslike consolidation within the left  lower lobe which may represent recurrent disease. The more superior located previously described area of consolidation within the left lower lobe has improved. Additional considerations for this new area would be an infectious/inflammatory process or post treatment changes. 2. Interval increase in size of right hepatic lobe lesion.  Interval development of new hepatic lesion, both compatible with metastasis. 3. Interval development of enlarged porta hepatic and preaortic lymph nodes, most compatible nodal metastasis. 4. Interval development of left hemi sacral insufficiency fractures. 5. Interval development of fractures through the left superior and inferior pubic rami. There may be some associated soft tissue raising the possibility of pathologic fractures. 6. Mild wedge compression deformity of the T4 vertebral body. 7. New 3 mm subpleural right middle lobe nodule. Recommend attention on follow-up. 8. Emphysema and aortic atherosclerosis. Electronically Signed   By: Lovey Newcomer M.D.   On: 09/03/2019 08:35   CT Abdomen Pelvis W Contrast  Result Date: 09/03/2019 CLINICAL DATA:  History of left lung cancer 2019 status post chemotherapy and radiation. Follow-up exam. EXAM: CT CHEST, ABDOMEN, AND PELVIS WITH CONTRAST TECHNIQUE: Multidetector CT imaging of the chest, abdomen and pelvis was performed following the standard protocol during bolus administration of intravenous contrast. CONTRAST:  120mL OMNIPAQUE IOHEXOL 300 MG/ML  SOLN COMPARISON:  PET-CT 08/09/2018; CT abdomen pelvis 07/23/2018 FINDINGS: CT CHEST FINDINGS Cardiovascular: Normal heart size. Thoracic aortic and coronary arterial vascular calcifications. Mediastinum/Nodes: Interval development of a 1.1 cm right paratracheal node (image 16; series 2). Normal appearance of the esophagus. No axillary lymphadenopathy. Lungs/Pleura: Patient status post left upper lobectomy. Centrilobular and paraseptal emphysematous changes. New 3 mm subpleural right middle lobe nodule (image 85; series 4). Interval development of a 5.3 x 2.2 cm area of masslike consolidation within the left lower lobe (image 76; series 4). The previously described nodular area of consolidation within the subpleural left lower lobe has nearly resolved. Small left pleural effusion. Musculoskeletal: Thoracic spine  degenerative changes. Mild wedge compression deformity of the T4 vertebral body (image 104; series 6). CT ABDOMEN PELVIS FINDINGS Hepatobiliary: Interval increase in size of right hepatic lobe lesion measuring 5.7 x 4.6 cm (image 55; series 2), previously 2.9 x 1.9 cm. There is an additional 1.6 x 1.3 cm low-attenuation lesion medial posterior right hepatic lobe (image 50; series 2). Gallbladder is unremarkable. No intrahepatic or extrahepatic biliary ductal dilatation. Pancreas: Unremarkable Spleen: Unremarkable Adrenals/Urinary Tract: Normal adrenal glands. Atrophic left kidney. No right-sided hydronephrosis. Urinary bladder is mildly distended. Stomach/Bowel: No abnormal bowel wall thickening or evidence for bowel obstruction. No free fluid or free intraperitoneal air. Normal morphology of the stomach. Vascular/Lymphatic: There is a 1.5 cm porta hepatic node (image 56; series 2). New 0.9 cm preaortic node (image 63; series 2). Peripheral calcified atherosclerotic plaque involving the abdominal aorta. Reproductive: Prior hysterectomy. Other: None. Musculoskeletal: Interval development of a fractures through the inferior and superior left pubic ramus (image 119; series 2). There appears to be some associated soft tissue and the possibility of pathologic fractures are not excluded. Interval development of left hemi sacral insufficiency fractures. Lumbar spine degenerative changes. IMPRESSION: 1. Interval development of a 5.3 cm area of masslike consolidation within the left lower lobe which may represent recurrent disease. The more superior located previously described area of consolidation within the left lower lobe has improved. Additional considerations for this new area would be an infectious/inflammatory process or post treatment changes. 2. Interval increase in size of right hepatic lobe lesion. Interval development of new hepatic lesion, both compatible with  metastasis. 3. Interval development of enlarged  porta hepatic and preaortic lymph nodes, most compatible nodal metastasis. 4. Interval development of left hemi sacral insufficiency fractures. 5. Interval development of fractures through the left superior and inferior pubic rami. There may be some associated soft tissue raising the possibility of pathologic fractures. 6. Mild wedge compression deformity of the T4 vertebral body. 7. New 3 mm subpleural right middle lobe nodule. Recommend attention on follow-up. 8. Emphysema and aortic atherosclerosis. Electronically Signed   By: Lovey Newcomer M.D.   On: 09/03/2019 08:35   DG Chest Port 1 View  Result Date: 09/05/2019 CLINICAL DATA:  Port placement.  History of lung cancer. EXAM: PORTABLE CHEST 1 VIEW COMPARISON:  CT chest dated September 02, 2019. FINDINGS: New right chest wall port catheter with tip in the mid SVC. The heart size and mediastinal contours are within normal limits. Normal pulmonary vascularity. Postsurgical changes and volume loss in the left hemithorax. Unchanged trace left pleural effusion. No consolidation or pneumothorax. No acute osseous abnormality. IMPRESSION: 1. New right chest wall port catheter without complicating feature. 2. Postsurgical changes in the left lung. Unchanged trace left pleural effusion. Electronically Signed   By: Titus Dubin M.D.   On: 09/05/2019 10:06   DG C-Arm 1-60 Min-No Report  Result Date: 09/05/2019 Fluoroscopy was utilized by the requesting physician.  No radiographic interpretation.     ASSESSMENT:  1.  Metastatic non-small cell lung cancer: -History of stage IIIa (T3 N1 M0) non-small cell lung cancer presented with 2 masses in the left upper lobe as well as questionable left hilar adenopathy diagnosed in December 2018. -4 cycles of platinum and pemetrexed from 04/02/2017 through 06/08/2017 -Left upper lobectomy with en bloc LLL superior segmentectomy on 07/08/2017, pathology showing invasive adenocarcinoma, moderately to poorly differentiated with  focal treatment effect and necrosis, 4.3 cm (mass #1) and invasive squamous cell carcinoma, moderately to poorly differentiated, 3.1 cm (mass #2), margins negative, YPT3Y PN 1. -PET scan on 08/04/2018 showed hypermetabolic right apical and retroaortic postradiation changes, intensely hypermetabolic subpleural spiculated nodules/correlating mass in the superior segment of the left lower lobe suspicious for locoregional disease recurrence.  Small left pleural effusion demonstrated mild FDG activity.  Right hepatic lobe segment 5 hypodensity with intense hypermetabolic activity.  Hypermetabolic and markedly enlarged porta hepatis lymph nodes intimately related to the caudate lobe. -MRI of the brain on 09/16/2018 shows 2 ring-enhancing lesions in the parasagittal right frontal lobe and long the left parieto-occipital sulcus measuring 1.7 and 1.3 cm respectively. -4 cycles of carbo/Pem/Pembro from 09/20/2018 through 12/06/2018 -Pembrolizumab maintenance from 01/03/2019 through 04/25/2019, with flare of rheumatoid arthritis. -Started on Remicade on 05/27/2019. -She was switched to pemetrexed maintenance on 06/27/2019. -She moved to [facility]  She was due for her pemetrexed on 08/08/2019. -She walks with the help of her walker and does very minimal household work.  She needs assistance taking shower and clothing herself. -She has tolerated pemetrexed except some tiredness and decreased appetite lasting 2 to 3 days after each treatment. -CT CAP on 09/02/2019 shows masslike consolidation in the left lower lobe measuring 5.3 x 2.2 cm.  Right hepatic lobe lesion measures 5.7 x 4.6 cm.  Additional 1.6 x 1.3 low-attenuation lesion medial posterior right hepatic lobe.  Enlarged porta hepatic and preaortic lymph nodes.  Left hemisacral insufficiency fracture.  Fracture through the left superior and inferior pubic rami.  Wedge compression deformity of T4 vertebral body.  2.  Brain metastasis: -Underwent SRS to the brain  meta stasis in the left temporal lobe, right frontal and left frontal lobes. -Last MRI of the brain was on 06/15/2019.  3.  Family history: -Maternal grandmother had colon cancer. -Maternal aunt had throat cancer. -Mother had breast cancer. -Oldest son died of pancreatic cancer.   PLAN:  1.  Metastatic non-small cell lung cancer: -We reviewed CT CAP from 09/02/2019.  By reading the report, lung lesion is smaller compared to CT scan done at [facility].  However there is slight increase in the liver lesion. -I have recommended obtaining discs of the scans and reporting an addendum by comparing most recent scan with scans from [place] in May 2021. -She had port placed today.  I have reviewed her labs.  LFTs are normal.  Creatinine is 1.13 with GFR 49 mL/min. -She will proceed with pemetrexed today.  She will be seen back in 3 weeks for follow-up.  2.  Brain metastasis: -I will order brain MRI at next visit.  3.  Family history: -I have recommended genetic testing.  4.  Rheumatoid arthritis: -Infliximab started on 05/27/2019.  She has an appointment to see Dr. Estanislado Pandy on 09/21/2019.   Orders placed this encounter:  No orders of the defined types were placed in this encounter.    Derek Jack, MD [place] [phone]   I, Milinda Antis, am acting as a scribe for Dr. Sanda Linger.  I, Derek Jack MD, have reviewed the above documentation for accuracy and completeness, and I agree with the above.

## 2019-09-06 ENCOUNTER — Encounter: Payer: Self-pay | Admitting: Genetic Counselor

## 2019-09-06 ENCOUNTER — Encounter (HOSPITAL_COMMUNITY): Payer: Self-pay | Admitting: General Surgery

## 2019-09-06 ENCOUNTER — Telehealth: Payer: Self-pay | Admitting: Genetic Counselor

## 2019-09-06 DIAGNOSIS — Z1379 Encounter for other screening for genetic and chromosomal anomalies: Secondary | ICD-10-CM | POA: Insufficient documentation

## 2019-09-06 NOTE — Telephone Encounter (Signed)
LM on VM that results are back and to please call. 

## 2019-09-07 ENCOUNTER — Ambulatory Visit: Payer: Self-pay | Admitting: Genetic Counselor

## 2019-09-07 DIAGNOSIS — Z1379 Encounter for other screening for genetic and chromosomal anomalies: Secondary | ICD-10-CM

## 2019-09-07 NOTE — Telephone Encounter (Signed)
Discussed that genetic testing did not identify why she has cancer or why her family members have cancer.  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  This can increase the risk for pancreatitis, as well as autosomal recessive CF and congenital absence of the vas deferens (CBAVD).  We will send her a copy of her test results so that her family members can be tested for this as well.

## 2019-09-07 NOTE — Progress Notes (Signed)
HPI:  Rose Lutz was previously seen in the [facility] due to a personal and family history of cancer and concerns regarding a hereditary predisposition to cancer. Please refer to our prior cancer genetics clinic note for more information regarding our discussion, assessment and recommendations, at the time. Ms. Encina recent genetic test results were disclosed to her, as were recommendations warranted by these results. These results and recommendations are discussed in more detail below.  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  The patient is a carrier for Cystic Fibrosis.  This does not change medical management and is not thought to be the cause of her cancer or that in the family. The Custom Hereditary Gene Panel offered by [facility] includes sequencing and/or deletion duplication testing of the following 64 genes: APC*, ATM*, AXIN2, BARD1, BLM, BMPR1A, BRCA1, BRCA2, BRIP1, BUB1B, CASR, CDH1, CDK4, CDKN2A (p14ARF), CDKN2A (p16INK4a), CEP57*, CFTR*, CHEK2, CPA1, CTNNA1, CTRC, DICER1*, ENG*, EPCAM*, FANCC, FLCN, GALNT12, GREM1*, HOXB13, KIT, MEN1*, MLH1*, MLH3*, MSH2*, MSH3*, MSH6*, MUTYH, NBN, NF1*, NTHL1, PALB2, PALLD, PDGFRA, PMS2*, POLD1*, POLE, PRSS1*, PTEN*, RAD50, RAD51C, RAD51D, RNF43, RPS20, SDHA*, SDHB, SDHC*, SDHD, SMAD4, SMARCA4, SPINK1, STK11, TP53, TSC1*, TSC2, and VHL.  The report date is September 05, 2019.   Metastatic non-small cell lung cancer ([place])  08/19/2019 Initial Diagnosis   Metastatic non-small cell lung cancer ([place])   09/05/2019 -  Chemotherapy   The patient had ondansetron (ZOFRAN) injection 8 mg, 8 mg (100 % of original dose 8 mg), Intravenous,  Once, 1 of 3 cycles Dose  modification: 8 mg (original dose 8 mg, Cycle 1) ondansetron (ZOFRAN) 8 mg in sodium chloride 0.9 % 50 mL IVPB, , Intravenous,  Once, 1 of 3 cycles Administration: 8 mg (09/05/2019) PEMEtrexed (ALIMTA) 1,000 mg in sodium chloride 0.9 % 100 mL chemo infusion, 500 mg/m2 = 1,000 mg, Intravenous,  Once, 1 of 3 cycles Administration: 1,000 mg (09/05/2019)  for chemotherapy treatment.      FAMILY HISTORY:  We obtained a detailed, 4-generation family history.  Significant diagnoses are listed below: Family History  Problem Relation Age of Onset  . Breast cancer Mother 2       d. 57  . Colon cancer Maternal Grandmother 19       d. 30  . Pancreatic cancer Son 34  . Healthy Father   . Arthritis Sister   . Arthritis Paternal Grandmother   . Other Son 52       MVA  . Esophageal cancer Maternal Aunt     The patient has three children.  One son died in a car accident and a second son died of pancreatic cancer which he was diagnosed with at age 40.  Her daughter is cancer free.  She has a full brother and sister and a paternal half sister.  All are cancer free, but the half sister died of a blood clot.  Both parents are deceased.  The patient's mother was diagnosed with breast cancer at 20.  She had two full brothers a full sister and a maternal half brother.  Her sister had throat cancer.  The patient's maternal grandmother had colon cancer at 37 and her grandfather died natural causes.  The patient's father also  died of natural causes at 63.  He had a brother and two sisters who were cancer free.  Her paternal grandparents are deceased.  Ms. Myung is unaware of previous family history of genetic testing for hereditary cancer risks. Patient's ancestors are of [place], [place] and [place] descent.. There is no reported Ashkenazi Jewish ancestry. There is no known consanguinity.    GENETIC TEST RESULTS: Genetic testing reported out on September 05, 2014 through the multi-cancer panel found a single  pathogenic variant in the CFTR gene called c.1210-34TG[12]T[5].  This single gene does not explain the cancer in the family, but is associated with autosomal recessive cystic fibrosis and congenital bilateral absence of the vas deferens (CBAVD) as well as an increased risk for pancreatitis. The test report has been scanned into EPIC and is located under the Molecular Pathology section of the Results Review tab.  A portion of the result report is included below for reference.     We discussed with Ms. Zamarripa that because current genetic testing is not perfect, it is possible there may be a gene mutation in one of these genes that current testing cannot detect, but that chance is small.  We also discussed, that there could be another gene that has not yet been discovered, or that we have not yet tested, that is responsible for the cancer diagnoses in the family. It is also possible there is a hereditary cause for the cancer in the family that Ms. Burnette did not inherit and therefore was not identified in her testing.  Therefore, it is important to remain in touch with cancer genetics in the future so that we can continue to offer Ms. Bertha the most up to date genetic testing.   ADDITIONAL GENETIC TESTING: We discussed with Ms. Morrow that her genetic testing was fairly extensive.  If there are genes identified to increase cancer risk that can be analyzed in the future, we would be happy to discuss and coordinate this testing at that time.    CANCER SCREENING RECOMMENDATIONS: Ms. Bracher did not test positive for a condition that would explain the cancer in the family. While her test result is considered not necessarily negative (normal), there is nothing that was identified that increases the risk for cancer.  This means that we have not identified a hereditary cause for her personal and family history of cancer at this time. Most cancers happen by chance and this test suggests that her cancer may fall into this  category.    While reassuring, this does not definitively rule out a hereditary predisposition to cancer. It is still possible that there could be genetic mutations that are undetectable by current technology. There could be genetic mutations in genes that have not been tested or identified to increase cancer risk.  Therefore, it is recommended she continue to follow the cancer management and screening guidelines provided by her oncology and primary healthcare provider.   An individual's cancer risk and medical management are not determined by genetic test results alone. Overall cancer risk assessment incorporates additional factors, including personal medical history, family history, and any available genetic information that may result in a personalized plan for cancer prevention and surveillance  CLINICAL CONDITION: The CFTR gene is associated with autosomal recessive cystic fibrosis (MedGen UID: 38453). Other CFTR-related disorders include congenital bilateral absence of the vas deferens (CBAVD; MedGen UID: 64680) and an increased risk for pancreatitis (PMID: 32122482, 50037048).  Cystic fibrosis (CF) is characterized by the buildup of mucus that can  damage the digestive, respiratory, and reproductive systems (PMID: 46803212). The severity of CF ranges from mild to severe and there is significant variability in presentation, even within a family (PMID: 24825003, 70488891, 69450388). Symptoms include chronic cough, recurring chest colds, wheezing, shortness of breath, recurring sinus infections, excessive sweating, and poor growth. In classic CF, symptoms begin in early childhood, with average life expectancy in the 68s. As the disease progresses, mucus buildup and recurring infections lead to irreparable lung damage and pulmonary complications, which are considered to be the primary cause of mortality (Cystic Fibrosis Foundation. [email or web address] ?Accessed February 18, 2016). In addition to pulmonary disease,  around 80-90% of individuals with classic CF have pancreatic insufficiency, which causes digestive problems, malnutrition, and poor growth (PMID: 82800349). Other symptoms include meconium ileus at birth (~20% of affected newborns), diabetes (20% of adolescents; 40-50% of adults), liver disease (5-10% of affected individuals in the first decade of life), and female infertility (>95% of affected males; PMID: [phone], 55374827, 07867544).  In mild cases of CF, symptoms may present later in life, with less severity, and may not impact life expectancy (PMID: 92010071).  CBAVD is associated with female infertility and refers to bilateral hypoplasia or aplasia of the vas deferens and seminal vesicles. Men are typically azoospermic but have normal testicular function and spermatogenesis, and are therefore able to have biological children with assisted reproductive technology (PMID: 21975883).  Hereditary pancreatitis is characterized by recurring episodes of acute inflammation of the pancreas beginning in childhood or adolescence and leading to chronic pancreatitis. The clinical presentation is highly variable and may include chronic abdominal pain, impairment of endocrine and exocrine pancreatic function, nausea and vomiting, maldigestion, and diabetes (GPQD:82641583). Pathogenic variants in CFTR may confer an approximately four- to ten-fold increased risk for chronic pancreatitis in heterozygous carriers. Other genetic and environmental risk factors are also known to modify this risk (PMID: 09407680, 8811031, 59458592, 92446286, 38177116, 57903833, 38329191). Chronic pancreatitis is a risk factor for pancreatic cancer (PMID: 66060045).  INHERITANCE: Cystic fibrosis and CBAVD exhibit autosomal recessive inheritance, and the affected individuals have two pathogenic variants-one in each copy of their CFTR genes. Affected individuals will pass one pathogenic CFTR variant to all of their children. For partners  who each carry a pathogenic variant in CFTR, the risk of having have an affected child is 25% (per pregnancy).  The increased risk for hereditary pancreatitis follows an autosomal dominant pattern of inheritance. This means that an individual with a single pathogenic variant has a 50% chance of passing along that variant and the increased disease risk to their offspring.  Family members of individuals with pathogenic CFTR variants may consider genetic testing, as they may be carriers as well.  MANAGEMENT FOR CARRIERS: Treatment for hereditary pancreatitis primarily focuses on pain management, maldigestion, and monitoring for diabetes and pancreatic cancer (PMID: 99774142). Adhering to a low-fat diet, eating small and frequent meals, taking enzyme supplements, keeping hydrated, and avoiding alcohol and smoking are advised (PMID: 39532023). In some cases, surgery (including total pancreatectomy with islet autotransplantation) may be warranted (PMID: 34356861). Specific recommendations exist for the treatment of pancreatic diabetes (PMID: 68372902).  Carriers are also at an increased risk of having a child affected with CF. For those of reproductive age, partners of known carriers should consider genetic testing to determine their reproductive risk. Around 1 in 8 people in the [place] are carriers of CF ([place]. http://www.[email or web address]-[email or web address] ?Accessed February 18, 2016); however, this risk varies slightly based on ethnicity. Reproductive options are available for  at-risk carrier couples, including prenatal diagnosis, IVF with preimplantation genetic diagnosis (PGD), gamete donation, and adoption. Additionally, newborn screening for CF is available in every state.  Treatment for hereditary pancreatitis primarily focuses on pain management, maldigestion, and monitoring for diabetes and pancreatic cancer (PMID: 92909030). Adhering to a low-fat diet, eating small and frequent meals, taking enzyme supplements,  keeping hydrated, and avoiding alcohol and smoking are advised (PMID: 14996924). In some cases, surgery (including total pancreatectomy with islet autotransplantation) may be warranted (PMID: 93241991). Specific recommendations exist for the treatment of pancreatic diabetes (PMID: 44458483).  Carriers are also at an increased risk of having a child affected with CF. For those of reproductive age, partners of known carriers should consider genetic testing to determine their reproductive risk. Around 1 in 11 people in the [place] are carriers of CF ([place]. http://www.[email or web address]-[email or web address] ?Accessed February 18, 2016); however, this risk varies slightly based on ethnicity. Reproductive options are available for at-risk carrier couples, including prenatal diagnosis, IVF with preimplantation genetic diagnosis (PGD), gamete donation, and adoption. Additionally, newborn screening for CF is available in every state.  RECOMMENDATIONS FOR FAMILY MEMBERS:  Individuals in this family might be at some increased risk of developing cancer, over the general population risk, simply due to the family history of cancer.  We recommended women in this family have a yearly mammogram beginning at age 6, or 78 years younger than the earliest onset of cancer, an annual clinical breast exam, and perform monthly breast self-exams. Women in this family should also have a gynecological exam as recommended by their primary provider. All family members should be referred for colonoscopy starting at age 85.  It is important that all of Ms. Heming's relatives (both men and women) know of the presence of this gene mutation. Site-specific genetic testing can sort out who in the family is at risk and who is not. It is advantageous to know if a CFTR pathogenic variant is present as medical management recommendations can be implemented.   Ms. Barraco children and siblings have a 50% chance to have inherited this mutation, her grandchildren are at 25%  risk. We recommend they have genetic testing for this same mutation, as identifying the presence of this mutation would allow them to also take advantage of risk-reducing measures.   FOLLOW-UP: Lastly, we discussed with Ms. Murphey that cancer genetics is a rapidly advancing field and it is possible that new genetic tests will be appropriate for her and/or her family members in the future. We encouraged her to remain in contact with cancer genetics on an annual basis so we can update her personal and family histories and let her know of advances in cancer genetics that may benefit this family.   Our contact number was provided. Ms. Mcdougle questions were answered to her satisfaction, and she knows she is welcome to call us at anytime with additional questions or concerns.   Roma Kayser, [place], [facility] Licensed, Certified Genetic Counselor [email or web address]

## 2019-09-07 NOTE — Progress Notes (Signed)
Office Visit Note  Patient: Rose Lutz             Date of Birth: [birth year]           MRN: [id number]             PCP: Jake Samples, PA-C Referring: Jake Samples, [place]* Visit Date: 09/21/2019 Occupation: @GUAROCC @  Subjective:  Left knee joint pain   History of Present Illness: Rose Lutz is a 69 y.o. female with history of seropositive rheumatoid arthritis, chondrocalcinosis, and osteoarthritis.  She is currently taking Arava and PLQ.  She reports since her last office visit she has undergone radiation and chemotherapy for metastatic bilateral non-small cell lung cancer to liver and brain.  She is primarily wheelchair-bound at this point due to worsening shortness of breath especially with exertion.  Patient reports that she was taking prednisone from February to May.  Patient reports that back in February she fell in the [employer] parking lot and fractured her pelvis.  She states that she was hospitalized for several days and had to go to rehab afterwards.  She presents today with increased pain and swelling in the left knee joint.  She has been experiencing significant stiffness and nocturnal pain.  She has also noticed worsening deformity in both hands.  Activities of Daily Living:  Patient reports joint stiffness all day  Patient Reports nocturnal pain.  Difficulty dressing/grooming: Reports Difficulty climbing stairs: Reports Difficulty getting out of chair: Denies Difficulty using hands for taps, buttons, cutlery, and/or writing: Reports  Review of Systems  Constitutional: Positive for fatigue.  HENT: Positive for mouth dryness. Negative for mouth sores and nose dryness.   Eyes: Negative for itching and dryness.  Respiratory: Positive for shortness of breath and difficulty breathing.   Cardiovascular: Negative for chest pain and palpitations.  Gastrointestinal: Positive for constipation. Negative for blood in stool and diarrhea.  Endocrine:  Negative for increased urination.  Genitourinary: Negative for difficulty urinating.  Musculoskeletal: Positive for arthralgias, joint pain, joint swelling and morning stiffness. Negative for myalgias, muscle tenderness and myalgias.  Skin: Negative for color change, rash and redness.  Allergic/Immunologic: Positive for susceptible to infections.  Neurological: Positive for dizziness and weakness. Negative for numbness, headaches and memory loss.  Hematological: Positive for bruising/bleeding tendency.  Psychiatric/Behavioral: Negative for confusion.    PMFS History:  Patient Active Problem List   Diagnosis Date Noted  . Genetic testing 09/06/2019  . Nonsquamous nonsmall cell neoplasm of left lung ([place])   . Goals of care, counseling/discussion 08/31/2019  . Family history of pancreatic cancer   . Family history of breast cancer   . Family history of colon cancer   . Metastatic non-small cell lung cancer ([place]) 08/19/2019  . Chondrocalcinosis 02/16/2018  . History of total right knee replacement 09/22/2017  . Primary malignant neoplasm of left upper lobe of lung ([place]) 01/22/2017  . Family history of cancer 01/22/2017  . Mass of left lung 12/19/2016  . Rheumatoid arthritis involving multiple sites with positive rheumatoid factor ([place]) 05/06/2016  . High risk medications (not anticoagulants) long-term use 05/06/2016  . Primary osteoarthritis of both hands 05/06/2016  . Primary osteoarthritis of left knee 05/06/2016  . Pain in left ankle and joints of left foot 05/06/2016  . Other fatigue 05/06/2016  . Dizziness and giddiness 02/05/2015  . HTN (hypertension) 02/05/2015    Past Medical History:  Diagnosis Date  . Chronic pain   . Depression   . Essential  hypertension   . Family history of breast cancer   . Family history of colon cancer   . Family history of pancreatic cancer   . GERD (gastroesophageal reflux disease)   . History of lung cancer 06/2017  . Osteoarthritis   .  Pulmonary asbestosis ([place])   . Rheumatoid arthritis ([place])     Family History  Problem Relation Age of Onset  . Breast cancer Mother 64       d. 64  . Colon cancer Maternal Grandmother 26       d. 69  . Pancreatic cancer Son 109  . Healthy Father   . Arthritis Sister   . Arthritis Paternal Grandmother   . Other Son 38       MVA  . Esophageal cancer Maternal Aunt    Past Surgical History:  Procedure Laterality Date  . ABDOMINAL HYSTERECTOMY    . BILATERAL OOPHORECTOMY    . CESAREAN SECTION     x3  . FEMUR FRACTURE SURGERY    . KNEE ARTHROPLASTY Right   . LUNG CANCER SURGERY Left 07/08/2017  . pelvis fracture/surgery     Dr. Cheri Rous  . PORTACATH PLACEMENT Right 09/05/2019   Procedure: INSERTION PORT-A-CATH;  Surgeon: Aviva Signs, MD;  Location: [facility];  Service: General;  Laterality: Right;  . TONSILLECTOMY    . VIDEO BRONCHOSCOPY WITH ENDOBRONCHIAL NAVIGATION Left 12/31/2016   Procedure: VIDEO BRONCHOSCOPY WITH ENDOBRONCHIAL NAVIGATION, left upper lung lobe;  Surgeon: Collene Gobble, MD;  Location: [facility];  Service: Thoracic;  Laterality: Left;  . YAG LASER APPLICATION Right 09/81/1914   Procedure: YAG LASER APPLICATION;  Surgeon: Rutherford Guys, MD;  Location: [facility];  Service: Ophthalmology;  Laterality: Right;  . YAG LASER APPLICATION Left 78/29/5621   Procedure: YAG LASER APPLICATION;  Surgeon: Rutherford Guys, MD;  Location: [facility];  Service: Ophthalmology;  Laterality: Left;   Social History   Social History Narrative  . Not on file   Immunization History  Administered Date(s) Administered  . Influenza-Unspecified 12/19/2016  . Moderna SARS-COVID-2 Vaccination 04/22/2019, 05/20/2019  . Pneumococcal Conjugate-13 10/20/2012  . Pneumococcal Polysaccharide-23 07/01/2004, 12/15/2012  . Tdap 07/26/2010  . Zoster 08/23/2009     Objective: Vital Signs: BP 114/80 (BP Location: Left Arm, Patient Position: Sitting, Cuff Size: Normal)   Pulse (!) 111   Resp 18   Ht 5\' 3"   (1.6 m)   Wt 190 lb (86.2 kg) Comment: per patient, patient in wheelchair  BMI 33.66 kg/m    Physical Exam Vitals and nursing note reviewed.  Constitutional:      Appearance: She is well-developed.  HENT:     Head: Normocephalic and atraumatic.  Eyes:     Conjunctiva/sclera: Conjunctivae normal.  Pulmonary:     Effort: Pulmonary effort is normal.  Abdominal:     Palpations: Abdomen is soft.  Musculoskeletal:     Cervical back: Normal range of motion.  Skin:    General: Skin is warm and dry.     Capillary Refill: Capillary refill takes less than 2 seconds.  Neurological:     Mental Status: She is alert and oriented to person, place, and time.  Psychiatric:        Behavior: Behavior normal.      Musculoskeletal Exam: C-spine good ROM.  Postural thoracic kyphosis.  Shoulder joints good ROM.  Elbow joint contractures noted.  Limited ROM of both wrist joints.  Thickening of MCP joints and ulnar deviation noted.  Hip joints difficult to assess  in wheelchair.  Right knee replacement has good ROM.  Left knee has warmth and swelling.  Limited and painful extension of the left knee joint.  No tenderness of ankle joints.  Pedal edema noted bilaterally.   CDAI Exam: CDAI Score: -- Patient Global: --; Provider Global: -- Swollen: --; Tender: -- Joint Exam 09/21/2019   No joint exam has been documented for this visit   There is currently no information documented on the homunculus. Go to the Rheumatology activity and complete the homunculus joint exam.  Investigation: No additional findings.  Imaging: CT Chest W Contrast  Result Date: 09/03/2019 CLINICAL DATA:  History of left lung cancer 2019 status post chemotherapy and radiation. Follow-up exam. EXAM: CT CHEST, ABDOMEN, AND PELVIS WITH CONTRAST TECHNIQUE: Multidetector CT imaging of the chest, abdomen and pelvis was performed following the standard protocol during bolus administration of intravenous contrast. CONTRAST:  135mL  OMNIPAQUE IOHEXOL 300 MG/ML  SOLN COMPARISON:  PET-CT 08/09/2018; CT abdomen pelvis 07/23/2018 FINDINGS: CT CHEST FINDINGS Cardiovascular: Normal heart size. Thoracic aortic and coronary arterial vascular calcifications. Mediastinum/Nodes: Interval development of a 1.1 cm right paratracheal node (image 16; series 2). Normal appearance of the esophagus. No axillary lymphadenopathy. Lungs/Pleura: Patient status post left upper lobectomy. Centrilobular and paraseptal emphysematous changes. New 3 mm subpleural right middle lobe nodule (image 85; series 4). Interval development of a 5.3 x 2.2 cm area of masslike consolidation within the left lower lobe (image 76; series 4). The previously described nodular area of consolidation within the subpleural left lower lobe has nearly resolved. Small left pleural effusion. Musculoskeletal: Thoracic spine degenerative changes. Mild wedge compression deformity of the T4 vertebral body (image 104; series 6). CT ABDOMEN PELVIS FINDINGS Hepatobiliary: Interval increase in size of right hepatic lobe lesion measuring 5.7 x 4.6 cm (image 55; series 2), previously 2.9 x 1.9 cm. There is an additional 1.6 x 1.3 cm low-attenuation lesion medial posterior right hepatic lobe (image 50; series 2). Gallbladder is unremarkable. No intrahepatic or extrahepatic biliary ductal dilatation. Pancreas: Unremarkable Spleen: Unremarkable Adrenals/Urinary Tract: Normal adrenal glands. Atrophic left kidney. No right-sided hydronephrosis. Urinary bladder is mildly distended. Stomach/Bowel: No abnormal bowel wall thickening or evidence for bowel obstruction. No free fluid or free intraperitoneal air. Normal morphology of the stomach. Vascular/Lymphatic: There is a 1.5 cm porta hepatic node (image 56; series 2). New 0.9 cm preaortic node (image 63; series 2). Peripheral calcified atherosclerotic plaque involving the abdominal aorta. Reproductive: Prior hysterectomy. Other: None. Musculoskeletal: Interval  development of a fractures through the inferior and superior left pubic ramus (image 119; series 2). There appears to be some associated soft tissue and the possibility of pathologic fractures are not excluded. Interval development of left hemi sacral insufficiency fractures. Lumbar spine degenerative changes. IMPRESSION: 1. Interval development of a 5.3 cm area of masslike consolidation within the left lower lobe which may represent recurrent disease. The more superior located previously described area of consolidation within the left lower lobe has improved. Additional considerations for this new area would be an infectious/inflammatory process or post treatment changes. 2. Interval increase in size of right hepatic lobe lesion. Interval development of new hepatic lesion, both compatible with metastasis. 3. Interval development of enlarged porta hepatic and preaortic lymph nodes, most compatible nodal metastasis. 4. Interval development of left hemi sacral insufficiency fractures. 5. Interval development of fractures through the left superior and inferior pubic rami. There may be some associated soft tissue raising the possibility of pathologic fractures. 6. Mild wedge compression deformity  of the T4 vertebral body. 7. New 3 mm subpleural right middle lobe nodule. Recommend attention on follow-up. 8. Emphysema and aortic atherosclerosis. Electronically Signed   By: Lovey Newcomer M.D.   On: 09/03/2019 08:35   CT Abdomen Pelvis W Contrast  Result Date: 09/03/2019 CLINICAL DATA:  History of left lung cancer 2019 status post chemotherapy and radiation. Follow-up exam. EXAM: CT CHEST, ABDOMEN, AND PELVIS WITH CONTRAST TECHNIQUE: Multidetector CT imaging of the chest, abdomen and pelvis was performed following the standard protocol during bolus administration of intravenous contrast. CONTRAST:  138mL OMNIPAQUE IOHEXOL 300 MG/ML  SOLN COMPARISON:  PET-CT 08/09/2018; CT abdomen pelvis 07/23/2018 FINDINGS: CT CHEST  FINDINGS Cardiovascular: Normal heart size. Thoracic aortic and coronary arterial vascular calcifications. Mediastinum/Nodes: Interval development of a 1.1 cm right paratracheal node (image 16; series 2). Normal appearance of the esophagus. No axillary lymphadenopathy. Lungs/Pleura: Patient status post left upper lobectomy. Centrilobular and paraseptal emphysematous changes. New 3 mm subpleural right middle lobe nodule (image 85; series 4). Interval development of a 5.3 x 2.2 cm area of masslike consolidation within the left lower lobe (image 76; series 4). The previously described nodular area of consolidation within the subpleural left lower lobe has nearly resolved. Small left pleural effusion. Musculoskeletal: Thoracic spine degenerative changes. Mild wedge compression deformity of the T4 vertebral body (image 104; series 6). CT ABDOMEN PELVIS FINDINGS Hepatobiliary: Interval increase in size of right hepatic lobe lesion measuring 5.7 x 4.6 cm (image 55; series 2), previously 2.9 x 1.9 cm. There is an additional 1.6 x 1.3 cm low-attenuation lesion medial posterior right hepatic lobe (image 50; series 2). Gallbladder is unremarkable. No intrahepatic or extrahepatic biliary ductal dilatation. Pancreas: Unremarkable Spleen: Unremarkable Adrenals/Urinary Tract: Normal adrenal glands. Atrophic left kidney. No right-sided hydronephrosis. Urinary bladder is mildly distended. Stomach/Bowel: No abnormal bowel wall thickening or evidence for bowel obstruction. No free fluid or free intraperitoneal air. Normal morphology of the stomach. Vascular/Lymphatic: There is a 1.5 cm porta hepatic node (image 56; series 2). New 0.9 cm preaortic node (image 63; series 2). Peripheral calcified atherosclerotic plaque involving the abdominal aorta. Reproductive: Prior hysterectomy. Other: None. Musculoskeletal: Interval development of a fractures through the inferior and superior left pubic ramus (image 119; series 2). There appears to  be some associated soft tissue and the possibility of pathologic fractures are not excluded. Interval development of left hemi sacral insufficiency fractures. Lumbar spine degenerative changes. IMPRESSION: 1. Interval development of a 5.3 cm area of masslike consolidation within the left lower lobe which may represent recurrent disease. The more superior located previously described area of consolidation within the left lower lobe has improved. Additional considerations for this new area would be an infectious/inflammatory process or post treatment changes. 2. Interval increase in size of right hepatic lobe lesion. Interval development of new hepatic lesion, both compatible with metastasis. 3. Interval development of enlarged porta hepatic and preaortic lymph nodes, most compatible nodal metastasis. 4. Interval development of left hemi sacral insufficiency fractures. 5. Interval development of fractures through the left superior and inferior pubic rami. There may be some associated soft tissue raising the possibility of pathologic fractures. 6. Mild wedge compression deformity of the T4 vertebral body. 7. New 3 mm subpleural right middle lobe nodule. Recommend attention on follow-up. 8. Emphysema and aortic atherosclerosis. Electronically Signed   By: Lovey Newcomer M.D.   On: 09/03/2019 08:35   DG Chest Port 1 View  Result Date: 09/05/2019 CLINICAL DATA:  Port placement.  History of lung  cancer. EXAM: PORTABLE CHEST 1 VIEW COMPARISON:  CT chest dated September 02, 2019. FINDINGS: New right chest wall port catheter with tip in the mid SVC. The heart size and mediastinal contours are within normal limits. Normal pulmonary vascularity. Postsurgical changes and volume loss in the left hemithorax. Unchanged trace left pleural effusion. No consolidation or pneumothorax. No acute osseous abnormality. IMPRESSION: 1. New right chest wall port catheter without complicating feature. 2. Postsurgical changes in the left lung.  Unchanged trace left pleural effusion. Electronically Signed   By: Titus Dubin M.D.   On: 09/05/2019 10:06   DG C-Arm 1-60 Min-No Report  Result Date: 09/05/2019 Fluoroscopy was utilized by the requesting physician.  No radiographic interpretation.    Recent Labs: Lab Results  Component Value Date   WBC 6.5 09/05/2019   HGB 11.0 (L) 09/05/2019   PLT 293 09/05/2019   NA 136 09/05/2019   K 4.4 09/05/2019   CL 103 09/05/2019   CO2 22 09/05/2019   GLUCOSE 138 (H) 09/05/2019   BUN 14 09/05/2019   CREATININE 1.13 (H) 09/05/2019   BILITOT 0.4 09/05/2019   ALKPHOS 76 09/05/2019   AST 31 09/05/2019   ALT 16 09/05/2019   PROT 7.7 09/05/2019   ALBUMIN 3.4 (L) 09/05/2019   CALCIUM 8.8 (L) 09/05/2019   GFRAA 57 (L) 09/05/2019    Speciality Comments: PLQ Eye exam: 09/08/18 WNL @ [facility] follow up in 1 year  Procedures:  Large Joint Inj: L knee on 09/21/2019 10:46 AM Indications: pain Details: 27 G 1.5 in needle, medial approach  Arthrogram: No  Medications: 1.5 mL lidocaine 1 %; 40 mg triamcinolone acetonide 40 MG/ML Aspirate: 0 mL Outcome: tolerated well, no immediate complications Procedure, treatment alternatives, risks and benefits explained, specific risks discussed. Consent was given by the patient. Immediately prior to procedure a time out was called to verify the correct patient, procedure, equipment, support staff and site/side marked as required. Patient was prepped and draped in the usual sterile fashion.     Allergies: Quinine derivatives, Sulfa antibiotics, Barium-containing compounds, and Percocet [oxycodone-acetaminophen]   Assessment / Plan:     Visit Diagnoses: Rheumatoid arthritis involving multiple sites with positive rheumatoid factor (HCC) - +RF, +CCP, +ANA, erosive, contractures: She has has been experiencing intermittent rheumatoid arthritis flares.  She is currently taking Arava 10 mg 2 tablets by mouth daily and Plaquenil 200 mg 1 tablet by mouth twice  daily Monday through Friday.  She is tolerating both medications without any side effects.  She was diagnosed with metastatic non-small cell lung cancer to the brain and liver and has undergone radiation and chemotherapy.  She was also on prednisone from February 2021 to May 2021.  She presents today with pain and inflammation in the left knee joint likely due to underlying chondrocalcinosis/pseudogout flare.  She has been having difficulty ambulating due to shortness of breath on exertion generalized weakness, and joint pain.  She requested to have a left knee joint injected with cortisone today.  She tolerated the procedure well.  She will continue taking Arava and Plaquenil as prescribed.  It appears that her much arthritis is overall well controlled at this time.  She will follow up in 5 months.   High risk medication use - Arava 10 mg 2 tablets daily and Plaquenil 200 mg 2 tablets daily Monday through Friday only.  No Plaquenil eye exam on file.  CBC and CMP were drawn on 09/05/2019. She has received both COVID-19 vaccinations and was encouraged to  receive the third dose.  She was advised to notify us or her PCP if she develops a COVID-19 infection in order to receive the antibody infusion.  She was encouraged to continue to wear a mask and social distance.  Chondrocalcinosis: Left knee.  She presents today with pain and inflammation in the left knee joint.  X-rays of the left knee were reviewed from 02/16/2018.  The left knee joint was injected with cortisone today.  She tolerated procedure well.  The procedure note was completed above.  Aftercare was discussed.  Primary osteoarthritis of both hands: She has PIP and DIP thickening consistent with osteoarthritis of both hands.  She has been experiencing increased stiffness but has no inflammation on examination today  Primary osteoarthritis of left knee: She presents today with severe pain in the left knee joint.  She has warmth and swelling of the left  knee joint on exam.  She has difficulty fully extending the left knee due to the severity of pain.  X-rays of the left knee were obtained on 02/16/2018 which revealed moderate osteoarthritis, moderate chondromalacia patella, and chondrocalcinosis.  It is likely that she is having a pseudogout flare.  A left knee joint cortisone injection was performed today.  Procedure note was completed above.  Aftercare was discussed.  She is primarily wheelchair-bound due to her shortness of breath with exertion.  History of total right knee replacement: Doing well.  She experiences stiffness but is not having any increased discomfort.  She is primarily wheelchair-bound at this point.  Primary malignant neoplasm of left upper lobe of lung ([place])  Metastatic non-small cell lung cancer ([place]) - Metastasis to brain and liver  Other medical conditions are listed as follows:   Finger clubbing  History of hypertension  History of anemia  History of gastroesophageal reflux (GERD)  Orders: Orders Placed This Encounter  Procedures  . Large Joint Inj   No orders of the defined types were placed in this encounter.   Face-to-face time spent with patient was 30 minutes. Greater than 50% of time was spent in counseling and coordination of care.  Follow-Up Instructions: Return in about 5 months (around 02/21/2020) for Rheumatoid arthritis.   Ofilia Neas, PA-C  Note - This record has been created using Dragon software.  Chart creation errors have been sought, but may not always  have been located. Such creation errors do not reflect on  the standard of medical care.

## 2019-09-08 ENCOUNTER — Encounter: Payer: Self-pay | Admitting: Genetic Counselor

## 2019-09-21 ENCOUNTER — Other Ambulatory Visit: Payer: Self-pay

## 2019-09-21 ENCOUNTER — Ambulatory Visit: Payer: Medicare Other | Admitting: Physician Assistant

## 2019-09-21 ENCOUNTER — Encounter: Payer: Self-pay | Admitting: Rheumatology

## 2019-09-21 VITALS — BP 114/80 | HR 111 | Resp 18 | Ht 63.0 in | Wt 190.0 lb

## 2019-09-21 DIAGNOSIS — Z96651 Presence of right artificial knee joint: Secondary | ICD-10-CM

## 2019-09-21 DIAGNOSIS — Z79899 Other long term (current) drug therapy: Secondary | ICD-10-CM | POA: Diagnosis not present

## 2019-09-21 DIAGNOSIS — M0579 Rheumatoid arthritis with rheumatoid factor of multiple sites without organ or systems involvement: Secondary | ICD-10-CM

## 2019-09-21 DIAGNOSIS — M1120 Other chondrocalcinosis, unspecified site: Secondary | ICD-10-CM

## 2019-09-21 DIAGNOSIS — M19041 Primary osteoarthritis, right hand: Secondary | ICD-10-CM | POA: Diagnosis not present

## 2019-09-21 DIAGNOSIS — R683 Clubbing of fingers: Secondary | ICD-10-CM

## 2019-09-21 DIAGNOSIS — C3412 Malignant neoplasm of upper lobe, left bronchus or lung: Secondary | ICD-10-CM

## 2019-09-21 DIAGNOSIS — Z8679 Personal history of other diseases of the circulatory system: Secondary | ICD-10-CM

## 2019-09-21 DIAGNOSIS — Z8719 Personal history of other diseases of the digestive system: Secondary | ICD-10-CM

## 2019-09-21 DIAGNOSIS — M1712 Unilateral primary osteoarthritis, left knee: Secondary | ICD-10-CM

## 2019-09-21 DIAGNOSIS — M19042 Primary osteoarthritis, left hand: Secondary | ICD-10-CM

## 2019-09-21 DIAGNOSIS — C3490 Malignant neoplasm of unspecified part of unspecified bronchus or lung: Secondary | ICD-10-CM

## 2019-09-21 DIAGNOSIS — Z862 Personal history of diseases of the blood and blood-forming organs and certain disorders involving the immune mechanism: Secondary | ICD-10-CM

## 2019-09-21 MED ORDER — LIDOCAINE HCL 1 % IJ SOLN
1.5000 mL | INTRAMUSCULAR | Status: AC | PRN
  Administered 2019-09-21: 1.5 mL

## 2019-09-21 MED ORDER — TRIAMCINOLONE ACETONIDE 40 MG/ML IJ SUSP
40.0000 mg | INTRAMUSCULAR | Status: AC | PRN
  Administered 2019-09-21: 40 mg via INTRA_ARTICULAR

## 2019-09-27 ENCOUNTER — Inpatient Hospital Stay (HOSPITAL_COMMUNITY): Payer: Medicare Other | Attending: Hematology | Admitting: Hematology

## 2019-09-27 ENCOUNTER — Encounter (HOSPITAL_COMMUNITY): Payer: Self-pay | Admitting: Hematology

## 2019-09-27 ENCOUNTER — Other Ambulatory Visit: Payer: Self-pay

## 2019-09-27 ENCOUNTER — Inpatient Hospital Stay (HOSPITAL_COMMUNITY): Payer: Medicare Other

## 2019-09-27 VITALS — BP 160/90 | HR 121 | Temp 97.0°F | Resp 18

## 2019-09-27 DIAGNOSIS — Z87891 Personal history of nicotine dependence: Secondary | ICD-10-CM | POA: Diagnosis not present

## 2019-09-27 DIAGNOSIS — C3490 Malignant neoplasm of unspecified part of unspecified bronchus or lung: Secondary | ICD-10-CM

## 2019-09-27 DIAGNOSIS — C3412 Malignant neoplasm of upper lobe, left bronchus or lung: Secondary | ICD-10-CM | POA: Diagnosis not present

## 2019-09-27 DIAGNOSIS — Z23 Encounter for immunization: Secondary | ICD-10-CM | POA: Insufficient documentation

## 2019-09-27 DIAGNOSIS — C7931 Secondary malignant neoplasm of brain: Secondary | ICD-10-CM | POA: Insufficient documentation

## 2019-09-27 DIAGNOSIS — C787 Secondary malignant neoplasm of liver and intrahepatic bile duct: Secondary | ICD-10-CM | POA: Diagnosis not present

## 2019-09-27 DIAGNOSIS — C3432 Malignant neoplasm of lower lobe, left bronchus or lung: Secondary | ICD-10-CM | POA: Diagnosis not present

## 2019-09-27 LAB — CBC WITH DIFFERENTIAL/PLATELET
Abs Immature Granulocytes: 0.02 10*3/uL (ref 0.00–0.07)
Basophils Absolute: 0 10*3/uL (ref 0.0–0.1)
Basophils Relative: 0 %
Eosinophils Absolute: 0 10*3/uL (ref 0.0–0.5)
Eosinophils Relative: 0 %
HCT: 32.4 % — ABNORMAL LOW (ref 36.0–46.0)
Hemoglobin: 9.9 g/dL — ABNORMAL LOW (ref 12.0–15.0)
Immature Granulocytes: 0 %
Lymphocytes Relative: 17 %
Lymphs Abs: 1 10*3/uL (ref 0.7–4.0)
MCH: 32.6 pg (ref 26.0–34.0)
MCHC: 30.6 g/dL (ref 30.0–36.0)
MCV: 106.6 fL — ABNORMAL HIGH (ref 80.0–100.0)
Monocytes Absolute: 0.2 10*3/uL (ref 0.1–1.0)
Monocytes Relative: 3 %
Neutro Abs: 4.6 10*3/uL (ref 1.7–7.7)
Neutrophils Relative %: 80 %
Platelets: 286 10*3/uL (ref 150–400)
RBC: 3.04 MIL/uL — ABNORMAL LOW (ref 3.87–5.11)
RDW: 16.4 % — ABNORMAL HIGH (ref 11.5–15.5)
WBC: 5.8 10*3/uL (ref 4.0–10.5)
nRBC: 0 % (ref 0.0–0.2)

## 2019-09-27 LAB — COMPREHENSIVE METABOLIC PANEL
ALT: 20 U/L (ref 0–44)
AST: 35 U/L (ref 15–41)
Albumin: 3.3 g/dL — ABNORMAL LOW (ref 3.5–5.0)
Alkaline Phosphatase: 75 U/L (ref 38–126)
Anion gap: 10 (ref 5–15)
BUN: 22 mg/dL (ref 8–23)
CO2: 23 mmol/L (ref 22–32)
Calcium: 9.1 mg/dL (ref 8.9–10.3)
Chloride: 105 mmol/L (ref 98–111)
Creatinine, Ser: 1.18 mg/dL — ABNORMAL HIGH (ref 0.44–1.00)
GFR calc Af Amer: 54 mL/min — ABNORMAL LOW (ref >60)
GFR calc non Af Amer: 47 mL/min — ABNORMAL LOW (ref >60)
Glucose, Bld: 125 mg/dL — ABNORMAL HIGH (ref 70–99)
Potassium: 4.8 mmol/L (ref 3.5–5.1)
Sodium: 138 mmol/L (ref 135–145)
Total Bilirubin: 0.6 mg/dL (ref 0.3–1.2)
Total Protein: 7.5 g/dL (ref 6.5–8.1)

## 2019-09-27 MED ORDER — SODIUM CHLORIDE 0.9 % IV SOLN
500.0000 mg/m2 | Freq: Once | INTRAVENOUS | Status: AC
  Administered 2019-09-27: 1000 mg via INTRAVENOUS
  Filled 2019-09-27: qty 40

## 2019-09-27 MED ORDER — MISC. DEVICES MISC: 99 refills | Status: DC

## 2019-09-27 MED ORDER — SODIUM CHLORIDE 0.9 % IV SOLN: Freq: Once | INTRAVENOUS | Status: AC

## 2019-09-27 MED ORDER — LACTULOSE 20 GM/30ML PO SOLN: 30.0000 mL | Freq: Every evening | ORAL | 3 refills | Status: DC | PRN

## 2019-09-27 MED ORDER — SODIUM CHLORIDE 0.9% FLUSH
10.0000 mL | INTRAVENOUS | Status: DC | PRN
  Administered 2019-09-27: 10 mL

## 2019-09-27 MED ORDER — HEPARIN SOD (PORK) LOCK FLUSH 100 UNIT/ML IV SOLN
500.0000 [IU] | Freq: Once | INTRAVENOUS | Status: AC | PRN
  Administered 2019-09-27: 500 [IU]

## 2019-09-27 MED ORDER — SODIUM CHLORIDE 0.9 % IV SOLN
Freq: Once | INTRAVENOUS | Status: AC
  Administered 2019-09-27: 8 mg via INTRAVENOUS
  Filled 2019-09-27: qty 4

## 2019-09-27 NOTE — Patient Instructions (Signed)
[place] at [facility] Discharge Instructions  You were seen today by [name]. He went over your recent results. You received your treatment today. You will be scheduled for an MRI of your brain before your next visit. Eat protein-dense meals or drink 2-3 cans of high-protein Ensure or Boost to increase your protein levels. You can proceed with your flu vaccine and COVID booster 2 weeks after receiving your chemo treatment. You will be prescribed lactulose for your constipation. [name] will see you back in 3 weeks for labs and follow up.   Thank you for choosing [place] at [facility] to provide your oncology and hematology care.  To afford each patient quality time with our provider, please arrive at least 15 minutes before your scheduled appointment time.   If you have a lab appointment with the [facility] please come in thru the Main Entrance and check in at the main information desk  You need to re-schedule your appointment should you arrive 10 or more minutes late.  We strive to give you quality time with our providers, and arriving late affects you and other patients whose appointments are after yours.  Also, if you no show three or more times for appointments you may be dismissed from the clinic at the providers discretion.     Again, thank you for choosing [facility].  Our hope is that these requests will decrease the amount of time that you wait before being seen by our physicians.       _____________________________________________________________  Should you have questions after your visit to [facility], please contact our office at [phone] between the hours of 8:00 a.m. and 4:30 p.m.  Voicemails left after 4:00 p.m. will not be returned until the following business day.  For prescription refill requests, have your pharmacy contact our office and allow 72 hours.    Cancer Center Support  Programs:   > Cancer Support Group  2nd Tuesday of the month 1pm-2pm, Journey Room

## 2019-09-27 NOTE — Patient Instructions (Signed)
[facility] Discharge Instructions for Patients Receiving Chemotherapy   Beginning January 23rd 2017 lab work for the [facility] will be done in the  Main lab at [facility] on 1st floor. If you have a lab appointment with the [facility] please come in thru the  Main Entrance and check in at the main information desk   Today you received the following chemotherapy agents Alimta  To help prevent nausea and vomiting after your treatment, we encourage you to take your nausea medication    If you develop nausea and vomiting, or diarrhea that is not controlled by your medication, call the clinic.  The clinic phone number is [phone]. Office hours are Monday-Friday 8:30am-5:00pm.  BELOW ARE SYMPTOMS THAT SHOULD BE REPORTED IMMEDIATELY:  *FEVER GREATER THAN 101.0 F  *CHILLS WITH OR WITHOUT FEVER  NAUSEA AND VOMITING THAT IS NOT CONTROLLED WITH YOUR NAUSEA MEDICATION  *UNUSUAL SHORTNESS OF BREATH  *UNUSUAL BRUISING OR BLEEDING  TENDERNESS IN MOUTH AND THROAT WITH OR WITHOUT PRESENCE OF ULCERS  *URINARY PROBLEMS  *BOWEL PROBLEMS  UNUSUAL RASH Items with * indicate a potential emergency and should be followed up as soon as possible. If you have an emergency after office hours please contact your primary care physician or go to the nearest emergency department.  Please call the clinic during office hours if you have any questions or concerns.   You may also contact the Patient Navigator at [phone] should you have any questions or need assistance in obtaining follow up care.      Resources For Cancer Patients and their Caregivers ? [employer]: Can assist with transportation, wigs, general needs, runs Look Good Feel Better.        6614821417 ? Cancer Care: Provides financial assistance, online support groups, medication/co-pay assistance.  1-800-813-HOPE 617 622 6348) ? [facility] Assists [place] cancer  patients and their families through emotional , educational and financial support.  604-113-4396 ? [place] DSS Where to apply for food stamps, Medicaid and utility assistance. 979-607-4349 ? [facility]: Transportation to medical appointments. 514 131 9164 ? Social Security Administration: May apply for disability if have a Stage IV cancer. 4144958953 727-235-3033 ? [employer] Disability and Transit Services: Assists with nutrition, care and transit needs. ([phone]

## 2019-09-27 NOTE — Progress Notes (Signed)
[place], [place] [id number]   CLINIC:  Medical Oncology/Hematology  PCP:  Rose Lutz [address] / [place] [address] [phone]   REASON FOR VISIT:  Follow-up for metastatic bilateral non-small cell lung cancer to liver and brain  PRIOR THERAPY:  1. Platinum and pemetrexed x 4 cycles from 04/02/2017 to 06/08/2017. 2. Left upper lobectomy on 07/08/2017. 3. Carbo/Pem/Pembro x 4 cycles from 09/20/2018 to 12/06/2018. 4. Pembrolizumab maintenance from 01/03/2019 to 04/25/2019.  NGS Results: CFTR carrier  CURRENT THERAPY: Pemetrexed every 3 weeks  BRIEF ONCOLOGIC HISTORY:  Oncology History  Primary malignant neoplasm of left upper lobe of lung (Rose Lutz)  01/22/2017 Initial Diagnosis   Primary malignant neoplasm of left upper lobe of lung (Rose Lutz)   09/05/2019 Genetic Testing   CFTR [id number][12]T[5] (Intronic) single pathogenic variant identified on a 64 gene custom panel.  The patient is a carrier for Cystic Fibrosis.  This does not change medical management and is not thought to be the cause of her cancer or that in the family. The Custom Hereditary Gene Panel offered by [facility] includes sequencing and/or deletion duplication testing of the following 64 genes: APC*, ATM*, AXIN2, BARD1, BLM, BMPR1A, BRCA1, BRCA2, BRIP1, BUB1B, CASR, CDH1, CDK4, CDKN2A (p14ARF), CDKN2A (p16INK4a), CEP57*, CFTR*, CHEK2, CPA1, CTNNA1, CTRC, DICER1*, ENG*, EPCAM*, FANCC, FLCN, GALNT12, GREM1*, HOXB13, KIT, MEN1*, MLH1*, MLH3*, MSH2*, MSH3*, MSH6*, MUTYH, NBN, NF1*, NTHL1, PALB2, PALLD, PDGFRA, PMS2*, POLD1*, POLE, PRSS1*, PTEN*, RAD50, RAD51C, RAD51D, RNF43, RPS20, SDHA*, SDHB, SDHC*, SDHD, SMAD4, SMARCA4, SPINK1, STK11, TP53, TSC1*, TSC2, and VHL.  The report date is September 05, 2019.   Metastatic non-small cell lung cancer (Rose Lutz)  08/19/2019 Initial Diagnosis   Metastatic non-small cell lung cancer (Rose Lutz)   09/05/2019 -  Chemotherapy   The  patient had ondansetron (ZOFRAN) injection 8 mg, 8 mg (100 % of original dose 8 mg), Intravenous,  Once, 1 of 1 cycle Dose modification: 8 mg (original dose 8 mg, Cycle 1) ondansetron (ZOFRAN) 8 mg in sodium chloride 0.9 % 50 mL IVPB, , Intravenous,  Once, 1 of 5 cycles Administration: 8 mg (09/05/2019) PEMEtrexed (ALIMTA) 1,000 mg in sodium chloride 0.9 % 100 mL chemo infusion, 500 mg/m2 = 1,000 mg, Intravenous,  Once, 1 of 5 cycles Administration: 1,000 mg (09/05/2019)  for chemotherapy treatment.      CANCER STAGING: Cancer Staging Primary malignant neoplasm of left upper lobe of lung (Rose Lutz) Staging form: Lung, AJCC 8th Edition - Clinical stage from 01/22/2017: Stage IIIA (cT4, cN0, cM0) - Unsigned   INTERVAL HISTORY:  [name], a 69 y.o. female, returns for routine follow-up and consideration for next cycle of chemotherapy. Rose Lutz was last seen on 09/05/2019.  Due for cycle #4 of pemetrexed today.   Overall, she tells me she has been feeling okay. She reports having nausea for 4 days after getting her chemo, but denies vomiting. She is fatigued and reports no worsening in her fatigue with the chemo. She is also constipated with 1 BM per week and takes stool softener daily; she tried Miralax, but it did not help. Her left knee pain is the worst pain that she is currently experiencing and received a steroid injection on 9/1 which did not help alleviate the pain. She uses a walker at home to ambulate.  Overall, she feels ready for next cycle of chemo today.    REVIEW OF SYSTEMS:  Review of Systems  Constitutional: Positive for appetite change (moderately decreased)  and fatigue (depleted).  Respiratory: Positive for shortness of breath (w/ exertion).   Gastrointestinal: Positive for abdominal pain, constipation (1 BM per week), nausea and vomiting.  Musculoskeletal: Positive for arthralgias (4/10 L knee pain).  All other systems reviewed and are negative.   PAST  MEDICAL/SURGICAL HISTORY:  Past Medical History:  Diagnosis Date  . Chronic pain   . Depression   . Essential hypertension   . Family history of breast cancer   . Family history of colon cancer   . Family history of pancreatic cancer   . GERD (gastroesophageal reflux disease)   . History of lung cancer 06/2017  . Osteoarthritis   . Pulmonary asbestosis (Rose Lutz)   . Rheumatoid arthritis Rose Lutz)    Past Surgical History:  Procedure Laterality Date  . ABDOMINAL HYSTERECTOMY    . BILATERAL OOPHORECTOMY    . CESAREAN SECTION     x3  . FEMUR FRACTURE SURGERY    . KNEE ARTHROPLASTY Right   . LUNG CANCER SURGERY Left 07/08/2017  . pelvis fracture/surgery     Dr. Cheri Rous  . PORTACATH PLACEMENT Right 09/05/2019   Procedure: INSERTION PORT-A-CATH;  Surgeon: Aviva Signs, MD;  Location: [facility];  Service: General;  Laterality: Right;  . TONSILLECTOMY    . VIDEO BRONCHOSCOPY WITH ENDOBRONCHIAL NAVIGATION Left 12/31/2016   Procedure: VIDEO BRONCHOSCOPY WITH ENDOBRONCHIAL NAVIGATION, left upper lung lobe;  Surgeon: Collene Gobble, MD;  Location: [facility];  Service: Thoracic;  Laterality: Left;  . YAG LASER APPLICATION Right 56/31/4970   Procedure: YAG LASER APPLICATION;  Surgeon: Rutherford Guys, MD;  Location: [facility];  Service: Ophthalmology;  Laterality: Right;  . YAG LASER APPLICATION Left 26/37/8588   Procedure: YAG LASER APPLICATION;  Surgeon: Rutherford Guys, MD;  Location: [facility];  Service: Ophthalmology;  Laterality: Left;    SOCIAL HISTORY:  Social History   Socioeconomic History  . Marital status: Married    Spouse name: Not on file  . Number of children: 3  . Years of education: Not on file  . Highest education level: Not on file  Occupational History  . Occupation: DISABLED  Tobacco Use  . Smoking status: Former Smoker    Packs/day: 1.25    Years: 38.00    Pack years: 47.50    Types: Cigarettes    Start date: 01/21/1968    Quit date: 01/20/2006    Years since quitting: 13.6  .  Smokeless tobacco: Never Used  Vaping Use  . Vaping Use: Never used  Substance and Sexual Activity  . Alcohol use: No    Alcohol/week: 0.0 standard drinks  . Drug use: No  . Sexual activity: Not Currently  Other Topics Concern  . Not on file  Social History Narrative  . Not on file   Social Determinants of Health   Financial Resource Strain: Medium Risk  . Difficulty of Paying Living Expenses: Somewhat hard  Food Insecurity: No Food Insecurity  . Worried About Charity fundraiser in the Last Year: Never true  . Ran Out of Food in the Last Year: Never true  Transportation Needs: No Transportation Needs  . Lack of Transportation (Medical): No  . Lack of Transportation (Non-Medical): No  Physical Activity: Inactive  . Days of Exercise per Week: 0 days  . Minutes of Exercise per Session: 0 min  Stress: Stress Concern Present  . Feeling of Stress : To some extent  Social Connections: Moderately Isolated  . Frequency of Communication with Friends and Family: Twice a  week  . Frequency of Social Gatherings with Friends and Family: Never  . Attends Religious Services: 1 to 4 times per year  . Active Member of Clubs or Organizations: No  . Attends Archivist Meetings: Never  . Marital Status: Married  Human resources officer Violence: Not At Risk  . Fear of Current or Ex-Partner: No  . Emotionally Abused: No  . Physically Abused: No  . Sexually Abused: No    FAMILY HISTORY:  Family History  Problem Relation Age of Onset  . Breast cancer Mother 21       d. 50  . Colon cancer Maternal Grandmother 35       d. 47  . Pancreatic cancer Son 68  . Healthy Father   . Arthritis Sister   . Arthritis Paternal Grandmother   . Other Son 16       MVA  . Esophageal cancer Maternal Aunt     CURRENT MEDICATIONS:  Current Outpatient Medications  Medication Sig Dispense Refill  . albuterol (VENTOLIN HFA) 108 (90 Base) MCG/ACT inhaler Inhale 1-2 puffs into the lungs every 6 (six)  hours as needed for wheezing or shortness of breath.     Marland Kitchen alendronate (FOSAMAX) 35 MG tablet Take 35 mg by mouth every 7 (seven) days.     Marland Kitchen amitriptyline (ELAVIL) 100 MG tablet Take 100 mg by mouth at bedtime.    . Ascorbic Acid (VITAMIN C) 250 MG CHEW Chew 250 mg by mouth daily.     . cetirizine (ZYRTEC) 10 MG tablet Take 10 mg by mouth daily as needed for allergies.     . diphenhydrAMINE (BENADRYL) 25 mg capsule Take 25 mg by mouth every 6 (six) hours as needed for itching or allergies.     . folic acid (FOLVITE) 1 MG tablet Take 1 tablet (1 mg total) by mouth daily. 90 tablet 4  . gabapentin (NEURONTIN) 100 MG capsule Take 100 mg by mouth 3 (three) times daily.   5  . hydrALAZINE (APRESOLINE) 25 MG tablet Take 25 mg by mouth daily.     Marland Kitchen HYDROcodone-acetaminophen (NORCO) 10-325 MG tablet Take 1 tablet by mouth every 6 (six) hours as needed for moderate pain.     . hydroxychloroquine (PLAQUENIL) 200 MG tablet TAKE 2 TABLETS BY MOUTH  EVERY EVENING. [place] (Patient taking differently: Take 400 mg by mouth every Monday, Tuesday, Wednesday, Thursday, and Friday. ) 120 tablet 0  . leflunomide (ARAVA) 10 MG tablet TAKE 2 TABLETS BY MOUTH  DAILY (Patient taking differently: Take 20 mg by mouth daily. ) 60 tablet 0  . losartan-hydrochlorothiazide (HYZAAR) 50-12.5 MG tablet Take 1 tablet by mouth daily.    . Multiple Vitamin (MULTIVITAMIN WITH MINERALS) TABS tablet Take 1 tablet by mouth daily.    Marland Kitchen omeprazole (PRILOSEC) 20 MG capsule Take 20 mg by mouth daily.    . ondansetron (ZOFRAN-ODT) 4 MG disintegrating tablet Take 4 mg by mouth every 8 (eight) hours as needed for nausea or vomiting.     . potassium chloride (MICRO-K) 10 MEQ CR capsule Take 10 mEq by mouth 2 (two) times daily.     . prochlorperazine (COMPAZINE) 10 MG tablet Take 10 mg by mouth every 6 (six) hours as needed for nausea or vomiting.     . senna (SENOKOT) 8.6 MG tablet Take 1 tablet by mouth at bedtime.     .  Vitamin D, Ergocalciferol, (DRISDOL) 50000 units CAPS capsule Take 1 capsule (50,000 Units total)  by mouth every 7 (seven) days. 12 capsule 0   No current facility-administered medications for this visit.    ALLERGIES:  Allergies  Allergen Reactions  . Quinine Derivatives Nausea And Vomiting  . Sulfa Antibiotics Nausea And Vomiting  . Barium-Containing Compounds   . Percocet [Oxycodone-Acetaminophen] Other (See Comments)    Headahces    PHYSICAL EXAM:  Performance status (ECOG): 2 - Symptomatic, <50% confined to bed  There were no vitals filed for this visit. Wt Readings from Last 3 Encounters:  09/21/19 190 lb (86.2 kg)  09/05/19 190 lb 12.8 oz (86.5 kg)  09/01/19 197 lb (89.4 kg)   Physical Exam Vitals reviewed.  Constitutional:      Appearance: Normal appearance. She is obese.  Cardiovascular:     Rate and Rhythm: Normal rate and regular rhythm.     Pulses: Normal pulses.     Heart sounds: Normal heart sounds.  Pulmonary:     Effort: Pulmonary effort is normal.     Breath sounds: Normal breath sounds.  Chest:     Comments: Port-a-Cath in R chest Musculoskeletal:     Right lower leg: Edema (trace) present.     Left lower leg: Edema (trace) present.  Neurological:     General: No focal deficit present.     Mental Status: She is alert and oriented to person, place, and time.  Psychiatric:        Mood and Affect: Mood normal.        Behavior: Behavior normal.     LABORATORY DATA:  I have reviewed the labs as listed.  CBC Latest Ref Rng & Units 09/27/2019 09/05/2019 08/19/2019  WBC 4.0 - 10.5 K/uL 5.8 6.5 9.5  Hemoglobin 12.0 - 15.0 g/dL 9.9(L) 11.0(L) 9.7(L)  Hematocrit 36 - 46 % 32.4(L) 37.2 32.5(L)  Platelets 150 - 400 K/uL 286 293 279   CMP Latest Ref Rng & Units 09/27/2019 09/05/2019 08/19/2019  Glucose 70 - 99 mg/dL 125(H) 138(H) 95  BUN 8 - 23 mg/dL '22 14 19  ' Creatinine 0.44 - 1.00 mg/dL 1.18(H) 1.13(H) 1.18(H)  Sodium 135 - 145 mmol/L 138 136 139  Potassium  3.5 - 5.1 mmol/L 4.8 4.4 4.6  Chloride 98 - 111 mmol/L 105 103 104  CO2 22 - 32 mmol/L '23 22 25  ' Calcium 8.9 - 10.3 mg/dL 9.1 8.8(L) 9.1  Total Protein 6.5 - 8.1 g/dL 7.5 7.7 7.0  Total Bilirubin 0.3 - 1.2 mg/dL 0.6 0.4 0.5  Alkaline Phos 38 - 126 U/L 75 76 75  AST 15 - 41 U/L 35 31 47(H)  ALT 0 - 44 U/L '20 16 27    ' DIAGNOSTIC IMAGING:  I have independently reviewed the scans and discussed with the patient. CT Chest W Contrast  Result Date: 09/03/2019 CLINICAL DATA:  History of left lung cancer 2019 status post chemotherapy and radiation. Follow-up exam. EXAM: CT CHEST, ABDOMEN, AND PELVIS WITH CONTRAST TECHNIQUE: Multidetector CT imaging of the chest, abdomen and pelvis was performed following the standard protocol during bolus administration of intravenous contrast. CONTRAST:  122m OMNIPAQUE IOHEXOL 300 MG/ML  SOLN COMPARISON:  PET-CT 08/09/2018; CT abdomen pelvis 07/23/2018 FINDINGS: CT CHEST FINDINGS Cardiovascular: Normal heart size. Thoracic aortic and coronary arterial vascular calcifications. Mediastinum/Nodes: Interval development of a 1.1 cm right paratracheal node (image 16; series 2). Normal appearance of the esophagus. No axillary lymphadenopathy. Lungs/Pleura: Patient status post left upper lobectomy. Centrilobular and paraseptal emphysematous changes. New 3 mm subpleural right middle lobe nodule (image 85; series  4). Interval development of a 5.3 x 2.2 cm area of masslike consolidation within the left lower lobe (image 76; series 4). The previously described nodular area of consolidation within the subpleural left lower lobe has nearly resolved. Small left pleural effusion. Musculoskeletal: Thoracic spine degenerative changes. Mild wedge compression deformity of the T4 vertebral body (image 104; series 6). CT ABDOMEN PELVIS FINDINGS Hepatobiliary: Interval increase in size of right hepatic lobe lesion measuring 5.7 x 4.6 cm (image 55; series 2), previously 2.9 x 1.9 cm. There is an  additional 1.6 x 1.3 cm low-attenuation lesion medial posterior right hepatic lobe (image 50; series 2). Gallbladder is unremarkable. No intrahepatic or extrahepatic biliary ductal dilatation. Pancreas: Unremarkable Spleen: Unremarkable Adrenals/Urinary Tract: Normal adrenal glands. Atrophic left kidney. No right-sided hydronephrosis. Urinary bladder is mildly distended. Stomach/Bowel: No abnormal bowel wall thickening or evidence for bowel obstruction. No free fluid or free intraperitoneal air. Normal morphology of the stomach. Vascular/Lymphatic: There is a 1.5 cm porta hepatic node (image 56; series 2). New 0.9 cm preaortic node (image 63; series 2). Peripheral calcified atherosclerotic plaque involving the abdominal aorta. Reproductive: Prior hysterectomy. Other: None. Musculoskeletal: Interval development of a fractures through the inferior and superior left pubic ramus (image 119; series 2). There appears to be some associated soft tissue and the possibility of pathologic fractures are not excluded. Interval development of left hemi sacral insufficiency fractures. Lumbar spine degenerative changes. IMPRESSION: 1. Interval development of a 5.3 cm area of masslike consolidation within the left lower lobe which may represent recurrent disease. The more superior located previously described area of consolidation within the left lower lobe has improved. Additional considerations for this new area would be an infectious/inflammatory process or post treatment changes. 2. Interval increase in size of right hepatic lobe lesion. Interval development of new hepatic lesion, both compatible with metastasis. 3. Interval development of enlarged porta hepatic and preaortic lymph nodes, most compatible nodal metastasis. 4. Interval development of left hemi sacral insufficiency fractures. 5. Interval development of fractures through the left superior and inferior pubic rami. There may be some associated soft tissue raising the  possibility of pathologic fractures. 6. Mild wedge compression deformity of the T4 vertebral body. 7. New 3 mm subpleural right middle lobe nodule. Recommend attention on follow-up. 8. Emphysema and aortic atherosclerosis. Electronically Signed   By: Lovey Newcomer M.D.   On: 09/03/2019 08:35   CT Abdomen Pelvis W Contrast  Result Date: 09/03/2019 CLINICAL DATA:  History of left lung cancer 2019 status post chemotherapy and radiation. Follow-up exam. EXAM: CT CHEST, ABDOMEN, AND PELVIS WITH CONTRAST TECHNIQUE: Multidetector CT imaging of the chest, abdomen and pelvis was performed following the standard protocol during bolus administration of intravenous contrast. CONTRAST:  173m OMNIPAQUE IOHEXOL 300 MG/ML  SOLN COMPARISON:  PET-CT 08/09/2018; CT abdomen pelvis 07/23/2018 FINDINGS: CT CHEST FINDINGS Cardiovascular: Normal heart size. Thoracic aortic and coronary arterial vascular calcifications. Mediastinum/Nodes: Interval development of a 1.1 cm right paratracheal node (image 16; series 2). Normal appearance of the esophagus. No axillary lymphadenopathy. Lungs/Pleura: Patient status post left upper lobectomy. Centrilobular and paraseptal emphysematous changes. New 3 mm subpleural right middle lobe nodule (image 85; series 4). Interval development of a 5.3 x 2.2 cm area of masslike consolidation within the left lower lobe (image 76; series 4). The previously described nodular area of consolidation within the subpleural left lower lobe has nearly resolved. Small left pleural effusion. Musculoskeletal: Thoracic spine degenerative changes. Mild wedge compression deformity of the T4 vertebral body (image  104; series 6). CT ABDOMEN PELVIS FINDINGS Hepatobiliary: Interval increase in size of right hepatic lobe lesion measuring 5.7 x 4.6 cm (image 55; series 2), previously 2.9 x 1.9 cm. There is an additional 1.6 x 1.3 cm low-attenuation lesion medial posterior right hepatic lobe (image 50; series 2). Gallbladder is  unremarkable. No intrahepatic or extrahepatic biliary ductal dilatation. Pancreas: Unremarkable Spleen: Unremarkable Adrenals/Urinary Tract: Normal adrenal glands. Atrophic left kidney. No right-sided hydronephrosis. Urinary bladder is mildly distended. Stomach/Bowel: No abnormal bowel wall thickening or evidence for bowel obstruction. No free fluid or free intraperitoneal air. Normal morphology of the stomach. Vascular/Lymphatic: There is a 1.5 cm porta hepatic node (image 56; series 2). New 0.9 cm preaortic node (image 63; series 2). Peripheral calcified atherosclerotic plaque involving the abdominal aorta. Reproductive: Prior hysterectomy. Other: None. Musculoskeletal: Interval development of a fractures through the inferior and superior left pubic ramus (image 119; series 2). There appears to be some associated soft tissue and the possibility of pathologic fractures are not excluded. Interval development of left hemi sacral insufficiency fractures. Lumbar spine degenerative changes. IMPRESSION: 1. Interval development of a 5.3 cm area of masslike consolidation within the left lower lobe which may represent recurrent disease. The more superior located previously described area of consolidation within the left lower lobe has improved. Additional considerations for this new area would be an infectious/inflammatory process or post treatment changes. 2. Interval increase in size of right hepatic lobe lesion. Interval development of new hepatic lesion, both compatible with metastasis. 3. Interval development of enlarged porta hepatic and preaortic lymph nodes, most compatible nodal metastasis. 4. Interval development of left hemi sacral insufficiency fractures. 5. Interval development of fractures through the left superior and inferior pubic rami. There may be some associated soft tissue raising the possibility of pathologic fractures. 6. Mild wedge compression deformity of the T4 vertebral body. 7. New 3 mm  subpleural right middle lobe nodule. Recommend attention on follow-up. 8. Emphysema and aortic atherosclerosis. Electronically Signed   By: Lovey Newcomer M.D.   On: 09/03/2019 08:35   DG Chest Port 1 View  Result Date: 09/05/2019 CLINICAL DATA:  Port placement.  History of lung cancer. EXAM: PORTABLE CHEST 1 VIEW COMPARISON:  CT chest dated September 02, 2019. FINDINGS: New right chest wall port catheter with tip in the mid SVC. The heart size and mediastinal contours are within normal limits. Normal pulmonary vascularity. Postsurgical changes and volume loss in the left hemithorax. Unchanged trace left pleural effusion. No consolidation or pneumothorax. No acute osseous abnormality. IMPRESSION: 1. New right chest wall port catheter without complicating feature. 2. Postsurgical changes in the left lung. Unchanged trace left pleural effusion. Electronically Signed   By: Titus Dubin M.D.   On: 09/05/2019 10:06   DG C-Arm 1-60 Min-No Report  Result Date: 09/05/2019 Fluoroscopy was utilized by the requesting physician.  No radiographic interpretation.     ASSESSMENT:  1. Metastatic non-small cell lung cancer: -History of stage IIIa (T3 N1 M0) non-small cell lung cancer presented with 2 masses in the left upper lobe as well as questionable left hilar adenopathy diagnosed in December 2018. -4 cycles of platinum and pemetrexed from 04/02/2017 through 06/08/2017 -Left upper lobectomy with en blocLLLsuperior segmentectomy on 07/08/2017, pathology showing invasive adenocarcinoma, moderately to poorly differentiated with focal treatment effect and necrosis, 4.3 cm (mass #1) and invasive squamous cell carcinoma, moderately to poorly differentiated, 3.1 cm (mass #2), margins negative, YPT3Y PN 1. -PET scan on 08/04/2018 showed hypermetabolic right apical and  retroaortic postradiation changes, intensely hypermetabolic subpleural spiculated nodules/correlating mass in the superior segment of the left lower lobe  suspicious for locoregional disease recurrence. Small left pleural effusion demonstrated mild FDG activity. Right hepatic lobe segment 5 hypodensity with intense hypermetabolic activity. Hypermetabolic and markedly enlarged porta hepatis lymph nodes intimately related to the caudate lobe. -MRI of the brain on 09/16/2018 shows 2 ring-enhancing lesions in the parasagittal right frontal lobe and long the left parieto-occipital sulcus measuring 1.7 and 1.3 cm respectively. -4 cycles of carbo/Pem/Pembro from 09/20/2018 through 12/06/2018 -Pembrolizumab maintenance from 01/03/2019 through 04/25/2019, with flare of rheumatoid arthritis. -Started on Remicade on 05/27/2019. -She was switched to pemetrexed maintenance on 06/27/2019. -She moved to [facility]. She was due for her pemetrexed on 08/08/2019. -She walks with the help of her walker and does very minimal household work. She needs assistance taking shower and clothing herself. -She has tolerated pemetrexed except some tiredness and decreased appetite lasting 2 to 3 days after each treatment. -CT CAP on 09/02/2019 shows masslike consolidation in the left lower lobe measuring 5.3 x 2.2 cm.  Right hepatic lobe lesion measures 5.7 x 4.6 cm.  Additional 1.6 x 1.3 low-attenuation lesion medial posterior right hepatic lobe.  Enlarged porta hepatic and preaortic lymph nodes.  Left hemisacral insufficiency fracture.  Fracture through the left superior and inferior pubic rami.  Wedge compression deformity of T4 vertebral body.  2. Brain metastasis: -Underwent SRS to the brain meta stasis in the left temporal lobe, right frontal and left frontal lobes. -Last MRI of the brain was on 06/15/2019.  3. Family history: -Maternal grandmother had colon cancer. -Maternal aunt had throat cancer. -Mother had breast cancer. -Oldest son died of pancreatic cancer.  PLAN:  1. Metastatic non-small cell lung cancer: -She felt slightly tired after last chemotherapy. -She  also reported worsening shortness of breath on exertion.  We will check her oxygen levels at rest and on exertion. -Reviewed labs today which showed creatinine of 1.18 with GFR 47.  LFTs are normal.  CBC shows normal white count.  Hemoglobin dropped to 9.9 from 11. -We will proceed with pemetrexed today.  We will reevaluate her in 3 weeks.  2. Brain metastasis:  -I will order brain MRI with and without contrast prior to next visit in 3 weeks.  3. Family history: -She was evaluated by our geneticist.  4. Rheumatoid arthritis: -She was evaluated by rheumatology.  Remicade is not needed at this time as she is getting chemotherapy.  5.  Constipation: -She is taking stool softener.  Its not helping.  We will start her on lactulose.  She was told to titrated as needed.   Orders placed this encounter:  No orders of the defined types were placed in this encounter.    Derek Jack, MD [place] [phone]   I, Milinda Antis, am acting as a scribe for Dr. Sanda Linger.  I, Derek Jack MD, have reviewed the above documentation for accuracy and completeness, and I agree with the above.

## 2019-09-27 NOTE — Progress Notes (Signed)
Rose Lutz presents today for D1C4 [place]. Pt denies any new changes or symptoms since last treatment. Lab results and vitals have been reviewed and are stable and within parameters for treatment. Patient has been assessed by [name] who has approved proceeding with treatment today as planned.  Infusions tolerated without incident or complaint. VSS upon completion of treatment. Port flushed and deaccessed per protocol, see MAR and IV flowsheet for details. Discharged in satisfactory condition with follow up instructions.

## 2019-09-27 NOTE — Progress Notes (Signed)
Patient assessed for oxygen.    Oxygen saturation at rest 99% Oxygen saturation while walking on room air 85% Oxygen saturation while walking on 2L nasal cannula 100%   Orders for oxygen sent to [employer]

## 2019-09-27 NOTE — Progress Notes (Signed)
Patient was assessed by Dr. Katragadda and labs have been reviewed.  Patient is okay to proceed with treatment today. Primary RN and pharmacy aware.   

## 2019-10-07 DIAGNOSIS — M069 Rheumatoid arthritis, unspecified: Secondary | ICD-10-CM | POA: Diagnosis not present

## 2019-10-07 DIAGNOSIS — I10 Essential (primary) hypertension: Secondary | ICD-10-CM | POA: Diagnosis not present

## 2019-10-07 DIAGNOSIS — G894 Chronic pain syndrome: Secondary | ICD-10-CM | POA: Diagnosis not present

## 2019-10-07 DIAGNOSIS — Z681 Body mass index (BMI) 19 or less, adult: Secondary | ICD-10-CM | POA: Diagnosis not present

## 2019-10-11 ENCOUNTER — Other Ambulatory Visit: Payer: Self-pay

## 2019-10-11 ENCOUNTER — Ambulatory Visit (HOSPITAL_COMMUNITY)
Admission: RE | Admit: 2019-10-11 | Discharge: 2019-10-11 | Disposition: A | Discharge status: Home / Self Care | Payer: Medicare Other | Source: Ambulatory Visit | Attending: Hematology | Admitting: Hematology

## 2019-10-11 DIAGNOSIS — C3490 Malignant neoplasm of unspecified part of unspecified bronchus or lung: Secondary | ICD-10-CM | POA: Insufficient documentation

## 2019-10-11 DIAGNOSIS — I6782 Cerebral ischemia: Secondary | ICD-10-CM | POA: Diagnosis not present

## 2019-10-11 DIAGNOSIS — G319 Degenerative disease of nervous system, unspecified: Secondary | ICD-10-CM | POA: Diagnosis not present

## 2019-10-11 DIAGNOSIS — C7931 Secondary malignant neoplasm of brain: Secondary | ICD-10-CM | POA: Diagnosis not present

## 2019-10-11 DIAGNOSIS — G936 Cerebral edema: Secondary | ICD-10-CM | POA: Diagnosis not present

## 2019-10-11 MED ORDER — GADOBUTROL 1 MMOL/ML IV SOLN
7.0000 mL | Freq: Once | INTRAVENOUS | Status: AC | PRN
  Administered 2019-10-11: 7 mL via INTRAVENOUS

## 2019-10-19 ENCOUNTER — Inpatient Hospital Stay (HOSPITAL_COMMUNITY): Payer: Medicare Other

## 2019-10-19 ENCOUNTER — Inpatient Hospital Stay (HOSPITAL_BASED_OUTPATIENT_CLINIC_OR_DEPARTMENT_OTHER): Payer: Medicare Other | Admitting: Hematology

## 2019-10-19 ENCOUNTER — Other Ambulatory Visit: Payer: Self-pay

## 2019-10-19 VITALS — BP 135/67 | HR 88 | Temp 97.3°F | Resp 18

## 2019-10-19 VITALS — BP 113/55 | HR 70 | Temp 96.8°F | Resp 20 | Wt 189.9 lb

## 2019-10-19 DIAGNOSIS — C7931 Secondary malignant neoplasm of brain: Secondary | ICD-10-CM | POA: Diagnosis not present

## 2019-10-19 DIAGNOSIS — C787 Secondary malignant neoplasm of liver and intrahepatic bile duct: Secondary | ICD-10-CM | POA: Diagnosis not present

## 2019-10-19 DIAGNOSIS — C3490 Malignant neoplasm of unspecified part of unspecified bronchus or lung: Secondary | ICD-10-CM

## 2019-10-19 DIAGNOSIS — C3432 Malignant neoplasm of lower lobe, left bronchus or lung: Secondary | ICD-10-CM | POA: Diagnosis not present

## 2019-10-19 DIAGNOSIS — Z87891 Personal history of nicotine dependence: Secondary | ICD-10-CM | POA: Diagnosis not present

## 2019-10-19 DIAGNOSIS — C3412 Malignant neoplasm of upper lobe, left bronchus or lung: Secondary | ICD-10-CM | POA: Diagnosis not present

## 2019-10-19 DIAGNOSIS — Z23 Encounter for immunization: Secondary | ICD-10-CM | POA: Diagnosis not present

## 2019-10-19 LAB — COMPREHENSIVE METABOLIC PANEL
ALT: 22 U/L (ref 0–44)
AST: 41 U/L (ref 15–41)
Albumin: 3.1 g/dL — ABNORMAL LOW (ref 3.5–5.0)
Alkaline Phosphatase: 78 U/L (ref 38–126)
Anion gap: 10 (ref 5–15)
BUN: 18 mg/dL (ref 8–23)
CO2: 21 mmol/L — ABNORMAL LOW (ref 22–32)
Calcium: 8.6 mg/dL — ABNORMAL LOW (ref 8.9–10.3)
Chloride: 105 mmol/L (ref 98–111)
Creatinine, Ser: 1.24 mg/dL — ABNORMAL HIGH (ref 0.44–1.00)
GFR calc Af Amer: 51 mL/min — ABNORMAL LOW (ref >60)
GFR calc non Af Amer: 44 mL/min — ABNORMAL LOW (ref >60)
Glucose, Bld: 92 mg/dL (ref 70–99)
Potassium: 4.1 mmol/L (ref 3.5–5.1)
Sodium: 136 mmol/L (ref 135–145)
Total Bilirubin: 0.4 mg/dL (ref 0.3–1.2)
Total Protein: 6.9 g/dL (ref 6.5–8.1)

## 2019-10-19 LAB — CBC WITH DIFFERENTIAL/PLATELET
Abs Immature Granulocytes: 0.02 10*3/uL (ref 0.00–0.07)
Basophils Absolute: 0 10*3/uL (ref 0.0–0.1)
Basophils Relative: 0 %
Eosinophils Absolute: 0.1 10*3/uL (ref 0.0–0.5)
Eosinophils Relative: 2 %
HCT: 29.7 % — ABNORMAL LOW (ref 36.0–46.0)
Hemoglobin: 8.8 g/dL — ABNORMAL LOW (ref 12.0–15.0)
Immature Granulocytes: 0 %
Lymphocytes Relative: 29 %
Lymphs Abs: 2.1 10*3/uL (ref 0.7–4.0)
MCH: 32.8 pg (ref 26.0–34.0)
MCHC: 29.6 g/dL — ABNORMAL LOW (ref 30.0–36.0)
MCV: 110.8 fL — ABNORMAL HIGH (ref 80.0–100.0)
Monocytes Absolute: 1 10*3/uL (ref 0.1–1.0)
Monocytes Relative: 14 %
Neutro Abs: 3.8 10*3/uL (ref 1.7–7.7)
Neutrophils Relative %: 55 %
Platelets: 232 10*3/uL (ref 150–400)
RBC: 2.68 MIL/uL — ABNORMAL LOW (ref 3.87–5.11)
RDW: 17.2 % — ABNORMAL HIGH (ref 11.5–15.5)
WBC: 7 10*3/uL (ref 4.0–10.5)
nRBC: 0 % (ref 0.0–0.2)

## 2019-10-19 MED ORDER — HEPARIN SOD (PORK) LOCK FLUSH 100 UNIT/ML IV SOLN
500.0000 [IU] | Freq: Once | INTRAVENOUS | Status: AC | PRN
  Administered 2019-10-19: 500 [IU]

## 2019-10-19 MED ORDER — INFLUENZA VAC A&B SA ADJ QUAD 0.5 ML IM PRSY
PREFILLED_SYRINGE | INTRAMUSCULAR | Status: AC
  Filled 2019-10-19: qty 0.5

## 2019-10-19 MED ORDER — INFLUENZA VAC A&B SA ADJ QUAD 0.5 ML IM PRSY
0.5000 mL | PREFILLED_SYRINGE | Freq: Once | INTRAMUSCULAR | Status: AC
  Administered 2019-10-19: 0.5 mL via INTRAMUSCULAR

## 2019-10-19 MED ORDER — SODIUM CHLORIDE 0.9 % IV SOLN: INTRAVENOUS | Status: AC

## 2019-10-19 MED ORDER — SODIUM CHLORIDE 0.9 % IV SOLN: Freq: Once | INTRAVENOUS | Status: AC

## 2019-10-19 MED ORDER — SODIUM CHLORIDE 0.9 % IV SOLN
Freq: Once | INTRAVENOUS | Status: AC
  Administered 2019-10-19: 8 mg via INTRAVENOUS
  Filled 2019-10-19: qty 4

## 2019-10-19 MED ORDER — SODIUM CHLORIDE 0.9% FLUSH
10.0000 mL | INTRAVENOUS | Status: DC | PRN
  Administered 2019-10-19: 10 mL

## 2019-10-19 MED ORDER — SODIUM CHLORIDE 0.9 % IV SOLN
500.0000 mg/m2 | Freq: Once | INTRAVENOUS | Status: AC
  Administered 2019-10-19: 1000 mg via INTRAVENOUS
  Filled 2019-10-19: qty 40

## 2019-10-19 NOTE — Progress Notes (Addendum)
Rose Lutz, Rose Lutz [id number]   CLINIC:  Medical Oncology/Hematology  PCP:  Scherrie Bateman [address] / [place] [address] [phone]   REASON FOR VISIT:  Follow-up for metastatic bilateral non-small cell lung cancer to liver and brain  PRIOR THERAPY:  1. Platinum and pemetrexed x 4 cycles from 04/02/2017 to 06/08/2017. 2. Left upper lobectomy on 07/08/2017. 3. Carbo/Pem/Pembro x 4 cycles from 09/20/2018 to 12/06/2018. 4. Pembrolizumab maintenance from 01/03/2019 to 04/25/2019.  NGS Results: CFTR carrier  CURRENT THERAPY: Pemetrexed every 3 weeks  BRIEF ONCOLOGIC HISTORY:  Oncology History  Primary malignant neoplasm of left upper lobe of lung ([place])  01/22/2017 Initial Diagnosis   Primary malignant neoplasm of left upper lobe of lung ([place])   09/05/2019 Genetic Testing   CFTR [id number][12]T[5] (Intronic) single pathogenic variant identified on a 64 gene custom panel.  The patient is a carrier for Cystic Fibrosis.  This does not change medical management and is not thought to be the cause of her cancer or that in the family. The Custom Hereditary Gene Panel offered by [facility] includes sequencing and/or deletion duplication testing of the following 64 genes: APC*, ATM*, AXIN2, BARD1, BLM, BMPR1A, BRCA1, BRCA2, BRIP1, BUB1B, CASR, CDH1, CDK4, CDKN2A (p14ARF), CDKN2A (p16INK4a), CEP57*, CFTR*, CHEK2, CPA1, CTNNA1, CTRC, DICER1*, ENG*, EPCAM*, FANCC, FLCN, GALNT12, GREM1*, HOXB13, KIT, MEN1*, MLH1*, MLH3*, MSH2*, MSH3*, MSH6*, MUTYH, NBN, NF1*, NTHL1, PALB2, PALLD, PDGFRA, PMS2*, POLD1*, POLE, PRSS1*, PTEN*, RAD50, RAD51C, RAD51D, RNF43, RPS20, SDHA*, SDHB, SDHC*, SDHD, SMAD4, SMARCA4, SPINK1, STK11, TP53, TSC1*, TSC2, and VHL.  The report date is September 05, 2019.   Metastatic non-small cell lung cancer ([place])  08/19/2019 Initial Diagnosis   Metastatic non-small cell lung cancer ([place])   09/05/2019 -  Chemotherapy   The  patient had ondansetron (ZOFRAN) injection 8 mg, 8 mg (100 % of original dose 8 mg), Intravenous,  Once, 1 of 1 cycle Dose modification: 8 mg (original dose 8 mg, Cycle 1) ondansetron (ZOFRAN) 8 mg in sodium chloride 0.9 % 50 mL IVPB, , Intravenous,  Once, 2 of 5 cycles Administration: 8 mg (09/05/2019), 8 mg (09/27/2019) PEMEtrexed (ALIMTA) 1,000 mg in sodium chloride 0.9 % 100 mL chemo infusion, 500 mg/m2 = 1,000 mg, Intravenous,  Once, 2 of 5 cycles Administration: 1,000 mg (09/05/2019), 1,000 mg (09/27/2019)  for chemotherapy treatment.      CANCER STAGING: Cancer Staging Primary malignant neoplasm of left upper lobe of lung ([place]) Staging form: Lung, AJCC 8th Edition - Clinical stage from 01/22/2017: Stage IIIA (cT4, cN0, cM0) - Unsigned   INTERVAL HISTORY:  Rose Lutz, a 69 y.o. female, returns for routine follow-up and consideration for next cycle of chemotherapy. Rose Lutz was last seen on 09/27/2019.  Due for cycle #5 of pemetrexed today.   Overall, Rose Lutz tells me Rose Lutz has been feeling okay. Rose Lutz tolerated the previous treatment well. Rose Lutz reports feeling lightheaded sometimes and had a fall last week without serious injury and needs a wheelchair now to prevent her from falling.   Rose Lutz has not received her supplemental oxygen yet.  Overall, Rose Lutz feels ready for next cycle of chemo today.    REVIEW OF SYSTEMS:  Review of Systems  Constitutional: Positive for appetite change (75%) and fatigue (depleted).  Respiratory: Positive for shortness of breath.   Gastrointestinal: Positive for constipation.  Musculoskeletal: Positive for arthralgias (9/10 buttocks pain).  Neurological: Positive for light-headedness.  All other systems reviewed and are negative.  PAST MEDICAL/SURGICAL HISTORY:  Past Medical History:  Diagnosis Date  . Chronic pain   . Depression   . Essential hypertension   . Family history of breast cancer   . Family history of colon cancer   . Family history  of pancreatic cancer   . GERD (gastroesophageal reflux disease)   . History of lung cancer 06/2017  . Osteoarthritis   . Pulmonary asbestosis ([place])   . Rheumatoid arthritis [facility])    Past Surgical History:  Procedure Laterality Date  . ABDOMINAL HYSTERECTOMY    . BILATERAL OOPHORECTOMY    . CESAREAN SECTION     x3  . FEMUR FRACTURE SURGERY    . KNEE ARTHROPLASTY Right   . LUNG CANCER SURGERY Left 07/08/2017  . pelvis fracture/surgery     Dr. Cheri Rous  . PORTACATH PLACEMENT Right 09/05/2019   Procedure: INSERTION PORT-A-CATH;  Surgeon: Aviva Signs, MD;  Location: [facility];  Service: General;  Laterality: Right;  . TONSILLECTOMY    . VIDEO BRONCHOSCOPY WITH ENDOBRONCHIAL NAVIGATION Left 12/31/2016   Procedure: VIDEO BRONCHOSCOPY WITH ENDOBRONCHIAL NAVIGATION, left upper lung lobe;  Surgeon: Collene Gobble, MD;  Location: [facility];  Service: Thoracic;  Laterality: Left;  . YAG LASER APPLICATION Right 85/88/5027   Procedure: YAG LASER APPLICATION;  Surgeon: Rutherford Guys, MD;  Location: [facility];  Service: Ophthalmology;  Laterality: Right;  . YAG LASER APPLICATION Left 74/12/8784   Procedure: YAG LASER APPLICATION;  Surgeon: Rutherford Guys, MD;  Location: [facility];  Service: Ophthalmology;  Laterality: Left;    SOCIAL HISTORY:  Social History   Socioeconomic History  . Marital status: Married    Spouse name: Not on file  . Number of children: 3  . Years of education: Not on file  . Highest education level: Not on file  Occupational History  . Occupation: DISABLED  Tobacco Use  . Smoking status: Former Smoker    Packs/day: 1.25    Years: 38.00    Pack years: 47.50    Types: Cigarettes    Start date: 01/21/1968    Quit date: 01/20/2006    Years since quitting: 13.7  . Smokeless tobacco: Never Used  Vaping Use  . Vaping Use: Never used  Substance and Sexual Activity  . Alcohol use: No    Alcohol/week: 0.0 standard drinks  . Drug use: No  . Sexual activity: Not Currently  Other Topics  Concern  . Not on file  Social History Narrative  . Not on file   Social Determinants of Health   Financial Resource Strain: Medium Risk  . Difficulty of Paying Living Expenses: Somewhat hard  Food Insecurity: No Food Insecurity  . Worried About Charity fundraiser in the Last Year: Never true  . Ran Out of Food in the Last Year: Never true  Transportation Needs: No Transportation Needs  . Lack of Transportation (Medical): No  . Lack of Transportation (Non-Medical): No  Physical Activity: Inactive  . Days of Exercise per Week: 0 days  . Minutes of Exercise per Session: 0 min  Stress: Stress Concern Present  . Feeling of Stress : To some extent  Social Connections: Moderately Isolated  . Frequency of Communication with Friends and Family: Twice a week  . Frequency of Social Gatherings with Friends and Family: Never  . Attends Religious Services: 1 to 4 times per year  . Active Member of Clubs or Organizations: No  . Attends Archivist Meetings: Never  . Marital Status: Married  Intimate Partner Violence: Not At Risk  . Fear of Current or Ex-Partner: No  . Emotionally Abused: No  . Physically Abused: No  . Sexually Abused: No    FAMILY HISTORY:  Family History  Problem Relation Age of Onset  . Breast cancer Mother 64       d. 64  . Colon cancer Maternal Grandmother 43       d. 81  . Pancreatic cancer Son 5  . Healthy Father   . Arthritis Sister   . Arthritis Paternal Grandmother   . Other Son 25       MVA  . Esophageal cancer Maternal Aunt     CURRENT MEDICATIONS:  Current Outpatient Medications  Medication Sig Dispense Refill  . albuterol (VENTOLIN HFA) 108 (90 Base) MCG/ACT inhaler Inhale 1-2 puffs into the lungs every 6 (six) hours as needed for wheezing or shortness of breath.     Marland Kitchen alendronate (FOSAMAX) 35 MG tablet Take 35 mg by mouth every 7 (seven) days.     Marland Kitchen amitriptyline (ELAVIL) 100 MG tablet Take 100 mg by mouth at bedtime.    . Ascorbic  Acid (VITAMIN C) 250 MG CHEW Chew 250 mg by mouth daily.     . cetirizine (ZYRTEC) 10 MG tablet Take 10 mg by mouth daily as needed for allergies.     . diphenhydrAMINE (BENADRYL) 25 mg capsule Take 25 mg by mouth every 6 (six) hours as needed for itching or allergies.     Marland Kitchen gabapentin (NEURONTIN) 100 MG capsule Take 100 mg by mouth 3 (three) times daily.   5  . hydrALAZINE (APRESOLINE) 25 MG tablet Take 25 mg by mouth daily.     . hydroxychloroquine (PLAQUENIL) 200 MG tablet TAKE 2 TABLETS BY MOUTH  EVERY EVENING. [place] (Patient taking differently: Take 400 mg by mouth every Monday, Tuesday, Wednesday, Thursday, and Friday. ) 120 tablet 0  . Lactulose 20 GM/30ML SOLN Take 30 mLs (20 g total) by mouth at bedtime as needed. 450 mL 3  . leflunomide (ARAVA) 10 MG tablet TAKE 2 TABLETS BY MOUTH  DAILY (Patient taking differently: Take 20 mg by mouth daily. ) 60 tablet 0  . losartan-hydrochlorothiazide (HYZAAR) 50-12.5 MG tablet Take 1 tablet by mouth daily.    . Misc. Devices MISC Please provide patient with oxygen via nasal cannula at 2 liters per minute continuous.  Please provide tubing for oxygen delivery. 1 each 99  . Multiple Vitamin (MULTIVITAMIN WITH MINERALS) TABS tablet Take 1 tablet by mouth daily.    Marland Kitchen omeprazole (PRILOSEC) 20 MG capsule Take 20 mg by mouth daily.    . Oxycodone HCl 10 MG TABS Take 10 mg by mouth 4 (four) times daily as needed.    . potassium chloride (MICRO-K) 10 MEQ CR capsule Take 10 mEq by mouth 2 (two) times daily.     Marland Kitchen senna (SENOKOT) 8.6 MG tablet Take 1 tablet by mouth at bedtime.     . Vitamin D, Ergocalciferol, (DRISDOL) 50000 units CAPS capsule Take 1 capsule (50,000 Units total) by mouth every 7 (seven) days. 12 capsule 0  . folic acid (FOLVITE) 1 MG tablet Take 1 tablet (1 mg total) by mouth daily. 90 tablet 4  . ondansetron (ZOFRAN-ODT) 4 MG disintegrating tablet Take 4 mg by mouth every 8 (eight) hours as needed for nausea or vomiting.   (Patient not taking: Reported on 10/19/2019)    . prochlorperazine (COMPAZINE) 10 MG tablet Take 10 mg  by mouth every 6 (six) hours as needed for nausea or vomiting.  (Patient not taking: Reported on 10/19/2019)     No current facility-administered medications for this visit.    ALLERGIES:  Allergies  Allergen Reactions  . Quinine Derivatives Nausea And Vomiting  . Sulfa Antibiotics Nausea And Vomiting  . Barium-Containing Compounds   . Percocet [Oxycodone-Acetaminophen] Other (See Comments)    Headahces    PHYSICAL EXAM:  Performance status (ECOG): 2 - Symptomatic, <50% confined to bed  Vitals:   10/19/19 1249  BP: (!) 113/55  Pulse: 70  Resp: 20  Temp: (!) 96.8 F (36 C)   Wt Readings from Last 3 Encounters:  10/19/19 189 lb 14.4 oz (86.1 kg)  09/21/19 190 lb (86.2 kg)  09/05/19 190 lb 12.8 oz (86.5 kg)   Physical Exam Vitals reviewed.  Constitutional:      Appearance: Normal appearance. Rose Lutz is obese.  Chest:     Comments: Port-a-Cath in R chest Neurological:     General: No focal deficit present.     Mental Status: Rose Lutz is alert and oriented to person, place, and time.  Psychiatric:        Mood and Affect: Mood normal.        Behavior: Behavior normal.     LABORATORY DATA:  I have reviewed the labs as listed.  CBC Latest Ref Rng & Units 10/19/2019 09/27/2019 09/05/2019  WBC 4.0 - 10.5 K/uL 7.0 5.8 6.5  Hemoglobin 12.0 - 15.0 g/dL 8.8(L) 9.9(L) 11.0(L)  Hematocrit 36 - 46 % 29.7(L) 32.4(L) 37.2  Platelets 150 - 400 K/uL 232 286 293   CMP Latest Ref Rng & Units 10/19/2019 09/27/2019 09/05/2019  Glucose 70 - 99 mg/dL 92 125(H) 138(H)  BUN 8 - 23 mg/dL '18 22 14  ' Creatinine 0.44 - 1.00 mg/dL 1.24(H) 1.18(H) 1.13(H)  Sodium 135 - 145 mmol/L 136 138 136  Potassium 3.5 - 5.1 mmol/L 4.1 4.8 4.4  Chloride 98 - 111 mmol/L 105 105 103  CO2 22 - 32 mmol/L 21(L) 23 22  Calcium 8.9 - 10.3 mg/dL 8.6(L) 9.1 8.8(L)  Total Protein 6.5 - 8.1 g/dL 6.9 7.5 7.7  Total Bilirubin 0.3  - 1.2 mg/dL 0.4 0.6 0.4  Alkaline Phos 38 - 126 U/L 78 75 76  AST 15 - 41 U/L 41 35 31  ALT 0 - 44 U/L '22 20 16    ' DIAGNOSTIC IMAGING:  I have independently reviewed the scans and discussed with the patient. MR Brain W Wo Contrast  Result Date: 10/11/2019 CLINICAL DATA:  Non-small cell lung cancer. EXAM: MRI HEAD WITHOUT AND WITH CONTRAST TECHNIQUE: Multiplanar, multiecho pulse sequences of the brain and surrounding structures were obtained without and with intravenous contrast. CONTRAST:  53m GADAVIST GADOBUTROL 1 MMOL/ML IV SOLN COMPARISON:  01/27/2017 MRI head.  02/22/2018 head CT. FINDINGS: Brain: Enhancing intracranial lesions include: -1.7 x 1.5 cm inferior right frontal lesion (15:32). -1.5 x 1.3 cm medial left occipital lesion (15:32). -5 mm inferior right frontal lesion (15:30). -5 mm left occipital lesion (15:31). -3 mm left frontal operculum lesion (15:38). The aforementioned lesions demonstrate perilesional edema. The medial left occipital lesion demonstrates minimal intralesional SWI signal dropout. No midline shift, ventriculomegaly or extra-axial fluid collection. Mild cerebral atrophy. Mild background chronic microvascular ischemic changes. Vascular: Major intracranial flow voids are preserved. Skull and upper cervical spine: Normal marrow signal. Sinuses/Orbits: Normal orbits. Sequela of bilateral lens replacement. Mild ethmoid sinus mucosal thickening. Trace right mastoid effusion. Other: None. IMPRESSION: Multifocal  supratentorial metastases as detailed above. Mild cerebral atrophy and chronic microvascular ischemic changes. Electronically Signed   By: Primitivo Gauze M.D.   On: 10/11/2019 12:56     ASSESSMENT:  1. Metastatic non-small cell lung cancer: -History of stage IIIa (T3 N1 M0) non-small cell lung cancer presented with 2 masses in the left upper lobe as well as questionable left hilar adenopathy diagnosed in December 2018. -4 cycles of platinum and pemetrexed from  04/02/2017 through 06/08/2017 -Left upper lobectomy with en blocLLLsuperior segmentectomy on 07/08/2017, pathology showing invasive adenocarcinoma, moderately to poorly differentiated with focal treatment effect and necrosis, 4.3 cm (mass #1) and invasive squamous cell carcinoma, moderately to poorly differentiated, 3.1 cm (mass #2), margins negative, YPT3Y PN 1. -PET scan on 08/04/2018 showed hypermetabolic right apical and retroaortic postradiation changes, intensely hypermetabolic subpleural spiculated nodules/correlating mass in the superior segment of the left lower lobe suspicious for locoregional disease recurrence. Small left pleural effusion demonstrated mild FDG activity. Right hepatic lobe segment 5 hypodensity with intense hypermetabolic activity. Hypermetabolic and markedly enlarged porta hepatis lymph nodes intimately related to the caudate lobe. -MRI of the brain on 09/16/2018 shows 2 ring-enhancing lesions in the parasagittal right frontal lobe and long the left parieto-occipital sulcus measuring 1.7 and 1.3 cm respectively. -4 cycles of carbo/Pem/Pembro from 09/20/2018 through 12/06/2018 -Pembrolizumab maintenance from 01/03/2019 through 04/25/2019, with flare of rheumatoid arthritis. -Started on Remicade on 05/27/2019. -Rose Lutz was switched to pemetrexed maintenance on 06/27/2019. -Rose Lutz moved to [facility]. Rose Lutz was due for her pemetrexed on 08/08/2019. -Rose Lutz walks with the help of her walker and does very minimal household work. Rose Lutz needs assistance taking shower and clothing herself. -Rose Lutz has tolerated pemetrexed except some tiredness and decreased appetite lasting 2 to 3 days after each treatment. -CT CAP on 09/02/2019 shows masslike consolidation in the left lower lobe measuring 5.3 x 2.2 cm. Right hepatic lobe lesion measures 5.7 x 4.6 cm. Additional 1.6 x 1.3 low-attenuation lesion medial posterior right hepatic lobe. Enlarged porta hepatic and preaortic lymph nodes. Left hemisacral  insufficiency fracture. Fracture through the left superior and inferior pubic rami. Wedge compression deformity of T4 vertebral body.  2. Brain metastasis: -Underwent SRS to the brain meta stasis in the left temporal lobe, right frontal and left frontal lobes. -Last MRI of the brain was on 06/15/2019. -Brain MRI on 10/11/2019 shows 1.7 x 1.5 cm inferior right frontal lesion, 1.5 x 1.3 cm medial left occipital lesion, 5 mm inferior right frontal lesion, 5 mm left occipital lesion, 3 mm left frontal operculum lesion. -I have compared them with MRI from [place].  All 5 lesions were present previously and the first 2 lesions have significantly decreased in size.  3. Family history: -Maternal grandmother had colon cancer. -Maternal aunt had throat cancer. -Mother had breast cancer. -Oldest son died of pancreatic cancer.   PLAN:  1. Metastatic non-small cell lung cancer: -Rose Lutz has tolerated last cycle chemotherapy very well. -Reviewed labs which showed normal LFTs.  Albumin low at 3.1.  Hemoglobin is 8.8 with normal white count and platelet count.  Creatinine increased to 1.24 with a GFR of 44. -I have recommended increasing fluid intake.  Rose Lutz will receive 100 mL of normal saline today.  Rose Lutz will proceed with her treatment today.  We will see her back in 3 weeks for follow-up.  2. Brain metastasis:  -I discussed results of brain MRI with the patient.  No intervention needed.  3. Family history: -[facility] testing shows CFTR heterozygosity.  4. Rheumatoid  arthritis: -Remicade not needed as Rose Lutz is getting chemotherapy.  5.  Constipation: -Continue lactulose as needed.  May increase it to 2-3 times a day if needed.  Rose Lutz will need oxygen as her oxygen levels dropped from 99% on room air to 86% while Rose Lutz is walking.  Oxygen levels improved to 100% on 2 L while walking.   Orders placed this encounter:  No orders of the defined types were placed in this encounter.    Derek Jack, MD [place] [phone]   I, Milinda Antis, am acting as a scribe for Dr. Sanda Linger.  I, Derek Jack MD, have reviewed the above documentation for accuracy and completeness, and I agree with the above.

## 2019-10-19 NOTE — Patient Instructions (Signed)
[place] at [facility] Discharge Instructions  You were seen today by [name]. He went over your recent results. You received your treatment today. Stop taking Advil or ibuprofen to prevent any more injury to your kidneys. [name] will see you back in 3 weeks for labs and follow up.   Thank you for choosing [place] at [facility] to provide your oncology and hematology care.  To afford each patient quality time with our provider, please arrive at least 15 minutes before your scheduled appointment time.   If you have a lab appointment with the [facility] please come in thru the Main Entrance and check in at the main information desk  You need to re-schedule your appointment should you arrive 10 or more minutes late.  We strive to give you quality time with our providers, and arriving late affects you and other patients whose appointments are after yours.  Also, if you no show three or more times for appointments you may be dismissed from the clinic at the providers discretion.     Again, thank you for choosing [facility].  Our hope is that these requests will decrease the amount of time that you wait before being seen by our physicians.       _____________________________________________________________  Should you have questions after your visit to [facility], please contact our office at [phone] between the hours of 8:00 a.m. and 4:30 p.m.  Voicemails left after 4:00 p.m. will not be returned until the following business day.  For prescription refill requests, have your pharmacy contact our office and allow 72 hours.    Cancer Center Support Programs:   > Cancer Support Group  2nd Tuesday of the month 1pm-2pm, Journey Room

## 2019-10-19 NOTE — Progress Notes (Signed)
Patient presents today for treatment and follow up visit with [name]. Vital signs within parameters for treatment. Labs reviewed by MD. Creatinine 1.24. Message received from [facility] / [name] to proceed with treatment. Give Flu vaccine today. 500 ml bolus of NS over an hour.   Treatment given today per MD orders. Tolerated infusion without adverse affects. Vital signs stable. No complaints at this time. Discharged from clinic via wheel chair in stable condition. Alert and oriented x 3. F/U with [facility] as scheduled.

## 2019-10-19 NOTE — Patient Instructions (Signed)
[facility] Discharge Instructions for Patients Receiving Chemotherapy  Today you received the following chemotherapy agents   To help prevent nausea and vomiting after your treatment, we encourage you to take your nausea medication   If you develop nausea and vomiting that is not controlled by your nausea medication, call the clinic.   BELOW ARE SYMPTOMS THAT SHOULD BE REPORTED IMMEDIATELY:  *FEVER GREATER THAN 100.5 F  *CHILLS WITH OR WITHOUT FEVER  NAUSEA AND VOMITING THAT IS NOT CONTROLLED WITH YOUR NAUSEA MEDICATION  *UNUSUAL SHORTNESS OF BREATH  *UNUSUAL BRUISING OR BLEEDING  TENDERNESS IN MOUTH AND THROAT WITH OR WITHOUT PRESENCE OF ULCERS  *URINARY PROBLEMS  *BOWEL PROBLEMS  UNUSUAL RASH Items with * indicate a potential emergency and should be followed up as soon as possible.  Feel free to call the clinic should you have any questions or concerns. The clinic phone number is [phone].  Please show the CHEMO ALERT CARD at check-in to the Emergency Department and triage nurse.   

## 2019-10-19 NOTE — Patient Instructions (Signed)
[facility] at [facility] Discharge Instructions  Labs drawn from portacath today   Thank you for choosing [facility] at [facility] to provide your oncology and hematology care.  To afford each patient quality time with our provider, please arrive at least 15 minutes before your scheduled appointment time.   If you have a lab appointment with the Cancer Center please come in thru the Main Entrance and check in at the main information desk.  You need to re-schedule your appointment should you arrive 10 or more minutes late.  We strive to give you quality time with our providers, and arriving late affects you and other patients whose appointments are after yours.  Also, if you no show three or more times for appointments you may be dismissed from the clinic at the providers discretion.     Again, thank you for choosing [facility].  Our hope is that these requests will decrease the amount of time that you wait before being seen by our physicians.       _____________________________________________________________  Should you have questions after your visit to [facility], please contact our office at [phone] and follow the prompts.  Our office hours are 8:00 a.m. and 4:30 p.m. Monday - Friday.  Please note that voicemails left after 4:00 p.m. may not be returned until the following business day.  We are closed weekends and major holidays.  You do have access to a nurse 24-7, just call the main number to the clinic [phone] and do not press any options, hold on the line and a nurse will answer the phone.    For prescription refill requests, have your pharmacy contact our office and allow 72 hours.    Due to Covid, you will need to wear a mask upon entering the hospital. If you do not have a mask, a mask will be given to you at the Main Entrance upon arrival. For doctor visits, patients may have 1 support person age 18  or older with them. For treatment visits, patients can not have anyone with them due to social distancing guidelines and our immunocompromised population.     

## 2019-10-20 ENCOUNTER — Other Ambulatory Visit (HOSPITAL_COMMUNITY): Payer: Self-pay | Admitting: *Deleted

## 2019-10-20 MED ORDER — MISC. DEVICES MISC: 99 refills | Status: AC

## 2019-10-21 DIAGNOSIS — C3490 Malignant neoplasm of unspecified part of unspecified bronchus or lung: Secondary | ICD-10-CM | POA: Diagnosis not present

## 2019-11-09 ENCOUNTER — Inpatient Hospital Stay (HOSPITAL_COMMUNITY): Payer: Medicare Other | Admitting: Hematology

## 2019-11-09 ENCOUNTER — Inpatient Hospital Stay (HOSPITAL_COMMUNITY): Payer: Medicare Other | Attending: Hematology

## 2019-11-09 ENCOUNTER — Inpatient Hospital Stay (HOSPITAL_COMMUNITY): Payer: Medicare Other

## 2019-11-09 ENCOUNTER — Other Ambulatory Visit: Payer: Self-pay

## 2019-11-09 VITALS — BP 165/86 | HR 100 | Temp 96.9°F | Resp 20

## 2019-11-09 VITALS — BP 113/68 | HR 98 | Temp 96.9°F | Resp 19 | Wt 179.6 lb

## 2019-11-09 DIAGNOSIS — C3490 Malignant neoplasm of unspecified part of unspecified bronchus or lung: Secondary | ICD-10-CM

## 2019-11-09 DIAGNOSIS — C3432 Malignant neoplasm of lower lobe, left bronchus or lung: Secondary | ICD-10-CM | POA: Insufficient documentation

## 2019-11-09 DIAGNOSIS — C787 Secondary malignant neoplasm of liver and intrahepatic bile duct: Secondary | ICD-10-CM | POA: Insufficient documentation

## 2019-11-09 DIAGNOSIS — C7931 Secondary malignant neoplasm of brain: Secondary | ICD-10-CM | POA: Diagnosis not present

## 2019-11-09 DIAGNOSIS — C3412 Malignant neoplasm of upper lobe, left bronchus or lung: Secondary | ICD-10-CM | POA: Diagnosis not present

## 2019-11-09 DIAGNOSIS — Z5111 Encounter for antineoplastic chemotherapy: Secondary | ICD-10-CM | POA: Diagnosis not present

## 2019-11-09 LAB — CBC WITH DIFFERENTIAL/PLATELET
Abs Immature Granulocytes: 0.04 10*3/uL (ref 0.00–0.07)
Basophils Absolute: 0 10*3/uL (ref 0.0–0.1)
Basophils Relative: 0 %
Eosinophils Absolute: 0 10*3/uL (ref 0.0–0.5)
Eosinophils Relative: 0 %
HCT: 29.6 % — ABNORMAL LOW (ref 36.0–46.0)
Hemoglobin: 9.3 g/dL — ABNORMAL LOW (ref 12.0–15.0)
Immature Granulocytes: 1 %
Lymphocytes Relative: 11 %
Lymphs Abs: 0.7 10*3/uL (ref 0.7–4.0)
MCH: 33.6 pg (ref 26.0–34.0)
MCHC: 31.4 g/dL (ref 30.0–36.0)
MCV: 106.9 fL — ABNORMAL HIGH (ref 80.0–100.0)
Monocytes Absolute: 0.2 10*3/uL (ref 0.1–1.0)
Monocytes Relative: 3 %
Neutro Abs: 5 10*3/uL (ref 1.7–7.7)
Neutrophils Relative %: 85 %
Platelets: 277 10*3/uL (ref 150–400)
RBC: 2.77 MIL/uL — ABNORMAL LOW (ref 3.87–5.11)
RDW: 16.5 % — ABNORMAL HIGH (ref 11.5–15.5)
WBC: 5.9 10*3/uL (ref 4.0–10.5)
nRBC: 0 % (ref 0.0–0.2)

## 2019-11-09 LAB — COMPREHENSIVE METABOLIC PANEL
ALT: 20 U/L (ref 0–44)
AST: 45 U/L — ABNORMAL HIGH (ref 15–41)
Albumin: 3.1 g/dL — ABNORMAL LOW (ref 3.5–5.0)
Alkaline Phosphatase: 93 U/L (ref 38–126)
Anion gap: 14 (ref 5–15)
BUN: 15 mg/dL (ref 8–23)
CO2: 17 mmol/L — ABNORMAL LOW (ref 22–32)
Calcium: 9 mg/dL (ref 8.9–10.3)
Chloride: 102 mmol/L (ref 98–111)
Creatinine, Ser: 1.29 mg/dL — ABNORMAL HIGH (ref 0.44–1.00)
GFR, Estimated: 42 mL/min — ABNORMAL LOW (ref >60)
Glucose, Bld: 117 mg/dL — ABNORMAL HIGH (ref 70–99)
Potassium: 4.7 mmol/L (ref 3.5–5.1)
Sodium: 133 mmol/L — ABNORMAL LOW (ref 135–145)
Total Bilirubin: 0.4 mg/dL (ref 0.3–1.2)
Total Protein: 7.6 g/dL (ref 6.5–8.1)

## 2019-11-09 MED ORDER — SODIUM CHLORIDE 0.9 % IV SOLN: Freq: Once | INTRAVENOUS | Status: AC

## 2019-11-09 MED ORDER — SODIUM CHLORIDE 0.9 % IV SOLN
150.0000 mg | Freq: Once | INTRAVENOUS | Status: AC
  Administered 2019-11-09: 150 mg via INTRAVENOUS
  Filled 2019-11-09: qty 150

## 2019-11-09 MED ORDER — SODIUM CHLORIDE 0.9 % IV SOLN: INTRAVENOUS | Status: DC

## 2019-11-09 MED ORDER — SODIUM CHLORIDE 0.9% FLUSH
10.0000 mL | INTRAVENOUS | Status: DC | PRN
  Administered 2019-11-09: 10 mL

## 2019-11-09 MED ORDER — CYANOCOBALAMIN 1000 MCG/ML IJ SOLN
1000.0000 ug | Freq: Once | INTRAMUSCULAR | Status: AC
  Administered 2019-11-09: 1000 ug via INTRAMUSCULAR
  Filled 2019-11-09: qty 1

## 2019-11-09 MED ORDER — HEPARIN SOD (PORK) LOCK FLUSH 100 UNIT/ML IV SOLN
500.0000 [IU] | Freq: Once | INTRAVENOUS | Status: AC | PRN
  Administered 2019-11-09: 500 [IU]

## 2019-11-09 MED ORDER — SODIUM CHLORIDE 0.9 % IV SOLN
500.0000 mg/m2 | Freq: Once | INTRAVENOUS | Status: AC
  Administered 2019-11-09: 1000 mg via INTRAVENOUS
  Filled 2019-11-09: qty 40

## 2019-11-09 MED ORDER — SODIUM CHLORIDE 0.9 % IV SOLN
Freq: Once | INTRAVENOUS | Status: AC
  Administered 2019-11-09: 8 mg via INTRAVENOUS
  Filled 2019-11-09: qty 4

## 2019-11-09 NOTE — Patient Instructions (Signed)
[facility] Discharge Instructions for Patients Receiving Chemotherapy  Today you received the following chemotherapy agents   To help prevent nausea and vomiting after your treatment, we encourage you to take your nausea medication   If you develop nausea and vomiting that is not controlled by your nausea medication, call the clinic.   BELOW ARE SYMPTOMS THAT SHOULD BE REPORTED IMMEDIATELY:  *FEVER GREATER THAN 100.5 F  *CHILLS WITH OR WITHOUT FEVER  NAUSEA AND VOMITING THAT IS NOT CONTROLLED WITH YOUR NAUSEA MEDICATION  *UNUSUAL SHORTNESS OF BREATH  *UNUSUAL BRUISING OR BLEEDING  TENDERNESS IN MOUTH AND THROAT WITH OR WITHOUT PRESENCE OF ULCERS  *URINARY PROBLEMS  *BOWEL PROBLEMS  UNUSUAL RASH Items with * indicate a potential emergency and should be followed up as soon as possible.  Feel free to call the clinic should you have any questions or concerns. The clinic phone number is [phone].  Please show the CHEMO ALERT CARD at check-in to the Emergency Department and triage nurse.   

## 2019-11-09 NOTE — Patient Instructions (Signed)
[place] at [facility] Discharge Instructions  You were seen today by [name]. He went over your recent results. You received your treatment today. You will be prescribed a new medication for your nausea, called Emend. Drink plenty of water daily to flush out your kidneys. You will be scheduled for a CT scan of your chest and abdomen before your next visit. [name] will see you back in 3 weeks for labs and follow up.   Thank you for choosing [place] at [facility] to provide your oncology and hematology care.  To afford each patient quality time with our provider, please arrive at least 15 minutes before your scheduled appointment time.   If you have a lab appointment with the [facility] please come in thru the Main Entrance and check in at the main information desk  You need to re-schedule your appointment should you arrive 10 or more minutes late.  We strive to give you quality time with our providers, and arriving late affects you and other patients whose appointments are after yours.  Also, if you no show three or more times for appointments you may be dismissed from the clinic at the providers discretion.     Again, thank you for choosing [facility].  Our hope is that these requests will decrease the amount of time that you wait before being seen by our physicians.       _____________________________________________________________  Should you have questions after your visit to [facility], please contact our office at [phone] between the hours of 8:00 a.m. and 4:30 p.m.  Voicemails left after 4:00 p.m. will not be returned until the following business day.  For prescription refill requests, have your pharmacy contact our office and allow 72 hours.    Cancer Center Support Programs:   > Cancer Support Group  2nd Tuesday of the month 1pm-2pm, Journey Room

## 2019-11-09 NOTE — Progress Notes (Signed)
Rose Lutz, [place] [address]   CLINIC:  Medical Oncology/Hematology  PCP:  Scherrie Bateman [address] / [place] [address] [phone]   REASON FOR VISIT:  Follow-up for metastatic bilateral non-small cell lung cancer to liver and brain  PRIOR THERAPY:  1. Platinum and pemetrexed x 4 cycles from 04/02/2017 to 06/08/2017. 2. Left upper lobectomy on 07/08/2017. 3. Carbo/Pem/Pembro x 4 cycles from 09/20/2018 to 12/06/2018. 4. Pembrolizumab maintenance from 01/03/2019 to 04/25/2019.  NGS Results: CFTR carrier  CURRENT THERAPY: Pemetrexed every 3 weeks  BRIEF ONCOLOGIC HISTORY:  Oncology History  Primary malignant neoplasm of left upper lobe of lung ([place])  01/22/2017 Initial Diagnosis   Primary malignant neoplasm of left upper lobe of lung ([place])   09/05/2019 Genetic Testing   CFTR [id number][12]T[5] (Intronic) single pathogenic variant identified on a 64 gene custom panel.  The patient is a carrier for Cystic Fibrosis.  This does not change medical management and is not thought to be the cause of her cancer or that in the family. The Custom Hereditary Gene Panel offered by [facility] includes sequencing and/or deletion duplication testing of the following 64 genes: APC*, ATM*, AXIN2, BARD1, BLM, BMPR1A, BRCA1, BRCA2, BRIP1, BUB1B, CASR, CDH1, CDK4, CDKN2A (p14ARF), CDKN2A (p16INK4a), CEP57*, CFTR*, CHEK2, CPA1, CTNNA1, CTRC, DICER1*, ENG*, EPCAM*, FANCC, FLCN, GALNT12, GREM1*, HOXB13, KIT, MEN1*, MLH1*, MLH3*, MSH2*, MSH3*, MSH6*, MUTYH, NBN, NF1*, NTHL1, PALB2, PALLD, PDGFRA, PMS2*, POLD1*, POLE, PRSS1*, PTEN*, RAD50, RAD51C, RAD51D, RNF43, RPS20, SDHA*, SDHB, SDHC*, SDHD, SMAD4, SMARCA4, SPINK1, STK11, TP53, TSC1*, TSC2, and VHL.  The report date is September 05, 2019.   Metastatic non-small cell lung cancer ([place])  08/19/2019 Initial Diagnosis   Metastatic non-small cell lung cancer ([place])   09/05/2019 -  Chemotherapy   The  patient had ondansetron (ZOFRAN) injection 8 mg, 8 mg (100 % of original dose 8 mg), Intravenous,  Once, 1 of 1 cycle Dose modification: 8 mg (original dose 8 mg, Cycle 1) ondansetron (ZOFRAN) 8 mg in sodium chloride 0.9 % 50 mL IVPB, , Intravenous,  Once, 3 of 5 cycles Administration: 8 mg (09/05/2019), 8 mg (09/27/2019), 8 mg (10/19/2019) PEMEtrexed (ALIMTA) 1,000 mg in sodium chloride 0.9 % 100 mL chemo infusion, 500 mg/m2 = 1,000 mg, Intravenous,  Once, 3 of 5 cycles Administration: 1,000 mg (09/05/2019), 1,000 mg (09/27/2019), 1,000 mg (10/19/2019)  for chemotherapy treatment.      CANCER STAGING: Cancer Staging Primary malignant neoplasm of left upper lobe of lung ([place]) Staging form: Lung, AJCC 8th Edition - Clinical stage from 01/22/2017: Stage IIIA (cT4, cN0, cM0) - Unsigned   INTERVAL HISTORY:  Rose Lutz, a 69 y.o. female, returns for routine follow-up and consideration for next cycle of chemotherapy. Rose Lutz was last seen on 10/19/2019.  Due for cycle #6 of pemetrexed today.   Overall, she tells me she has been feeling poorly. She reports having nausea for 5 days 1 week after her treatment, but denies vomiting; she took Zofran which made her constipated. Her last episode of nausea was yesterday and lasted 4 hours, despite taking Zofran. She also takes Decadron 1 tablet day before her chemo, 1 tablet the day of, and 1 tablet day after chemo. She continues having SOB with activity.  Overall, she feels ready for next cycle of chemo today.    REVIEW OF SYSTEMS:  Review of Systems  Constitutional: Positive for appetite change (25%) and fatigue (25%).  Respiratory: Positive for shortness of breath (  w/ exertion).   Cardiovascular: Positive for chest pain.  Gastrointestinal: Positive for constipation and nausea (5 days 1 week after Tx). Negative for vomiting.  Neurological: Positive for dizziness.  Psychiatric/Behavioral: Positive for depression.  All other systems reviewed  and are negative.   PAST MEDICAL/SURGICAL HISTORY:  Past Medical History:  Diagnosis Date  . Chronic pain   . Depression   . Essential hypertension   . Family history of breast cancer   . Family history of colon cancer   . Family history of pancreatic cancer   . GERD (gastroesophageal reflux disease)   . History of lung cancer 06/2017  . Osteoarthritis   . Pulmonary asbestosis ([place])   . Rheumatoid arthritis [facility])    Past Surgical History:  Procedure Laterality Date  . ABDOMINAL HYSTERECTOMY    . BILATERAL OOPHORECTOMY    . CESAREAN SECTION     x3  . FEMUR FRACTURE SURGERY    . KNEE ARTHROPLASTY Right   . LUNG CANCER SURGERY Left 07/08/2017  . pelvis fracture/surgery     Dr. Cheri Rous  . PORTACATH PLACEMENT Right 09/05/2019   Procedure: INSERTION PORT-A-CATH;  Surgeon: Aviva Signs, MD;  Location: [facility];  Service: General;  Laterality: Right;  . TONSILLECTOMY    . VIDEO BRONCHOSCOPY WITH ENDOBRONCHIAL NAVIGATION Left 12/31/2016   Procedure: VIDEO BRONCHOSCOPY WITH ENDOBRONCHIAL NAVIGATION, left upper lung lobe;  Surgeon: Collene Gobble, MD;  Location: [facility];  Service: Thoracic;  Laterality: Left;  . YAG LASER APPLICATION Right 32/20/2542   Procedure: YAG LASER APPLICATION;  Surgeon: Rutherford Guys, MD;  Location: [facility];  Service: Ophthalmology;  Laterality: Right;  . YAG LASER APPLICATION Left 70/62/3762   Procedure: YAG LASER APPLICATION;  Surgeon: Rutherford Guys, MD;  Location: [facility];  Service: Ophthalmology;  Laterality: Left;    SOCIAL HISTORY:  Social History   Socioeconomic History  . Marital status: Married    Spouse name: Not on file  . Number of children: 3  . Years of education: Not on file  . Highest education level: Not on file  Occupational History  . Occupation: DISABLED  Tobacco Use  . Smoking status: Former Smoker    Packs/day: 1.25    Years: 38.00    Pack years: 47.50    Types: Cigarettes    Start date: 01/21/1968    Quit date: 01/20/2006    Years  since quitting: 13.8  . Smokeless tobacco: Never Used  Vaping Use  . Vaping Use: Never used  Substance and Sexual Activity  . Alcohol use: No    Alcohol/week: 0.0 standard drinks  . Drug use: No  . Sexual activity: Not Currently  Other Topics Concern  . Not on file  Social History Narrative  . Not on file   Social Determinants of Health   Financial Resource Strain: Medium Risk  . Difficulty of Paying Living Expenses: Somewhat hard  Food Insecurity: No Food Insecurity  . Worried About Charity fundraiser in the Last Year: Never true  . Ran Out of Food in the Last Year: Never true  Transportation Needs: No Transportation Needs  . Lack of Transportation (Medical): No  . Lack of Transportation (Non-Medical): No  Physical Activity: Inactive  . Days of Exercise per Week: 0 days  . Minutes of Exercise per Session: 0 min  Stress: Stress Concern Present  . Feeling of Stress : To some extent  Social Connections: Moderately Isolated  . Frequency of Communication with Friends and Family: Twice a week  .  Frequency of Social Gatherings with Friends and Family: Never  . Attends Religious Services: 1 to 4 times per year  . Active Member of Clubs or Organizations: No  . Attends Archivist Meetings: Never  . Marital Status: Married  Human resources officer Violence: Not At Risk  . Fear of Current or Ex-Partner: No  . Emotionally Abused: No  . Physically Abused: No  . Sexually Abused: No    FAMILY HISTORY:  Family History  Problem Relation Age of Onset  . Breast cancer Mother 8       d. 28  . Colon cancer Maternal Grandmother 60       d. 38  . Pancreatic cancer Son 35  . Healthy Father   . Arthritis Sister   . Arthritis Paternal Grandmother   . Other Son 88       MVA  . Esophageal cancer Maternal Aunt     CURRENT MEDICATIONS:  Current Outpatient Medications  Medication Sig Dispense Refill  . alendronate (FOSAMAX) 35 MG tablet Take 35 mg by mouth every 7 (seven) days.      Marland Kitchen amitriptyline (ELAVIL) 100 MG tablet Take 100 mg by mouth at bedtime.    . Ascorbic Acid (VITAMIN C) 250 MG CHEW Chew 250 mg by mouth daily.     . cetirizine (ZYRTEC) 10 MG tablet Take 10 mg by mouth daily as needed for allergies.     Marland Kitchen dexamethasone (DECADRON) 4 MG tablet Take by mouth.    . diphenhydrAMINE (BENADRYL) 25 mg capsule Take 25 mg by mouth every 6 (six) hours as needed for itching or allergies.     Marland Kitchen gabapentin (NEURONTIN) 100 MG capsule Take 100 mg by mouth 3 (three) times daily.   5  . hydrALAZINE (APRESOLINE) 25 MG tablet Take 25 mg by mouth daily.     . hydroxychloroquine (PLAQUENIL) 200 MG tablet TAKE 2 TABLETS BY MOUTH  EVERY EVENING. [place] (Patient taking differently: Take 400 mg by mouth every Monday, Tuesday, Wednesday, Thursday, and Friday. ) 120 tablet 0  . Lactulose 20 GM/30ML SOLN Take 30 mLs (20 g total) by mouth at bedtime as needed. 450 mL 3  . leflunomide (ARAVA) 10 MG tablet TAKE 2 TABLETS BY MOUTH  DAILY (Patient taking differently: Take 20 mg by mouth daily. ) 60 tablet 0  . losartan-hydrochlorothiazide (HYZAAR) 50-12.5 MG tablet Take 1 tablet by mouth daily.    . Misc. Devices MISC Please provide patient with oxygen via nasal cannula at 2 liters per minute continuous.  Please provide tubing for oxygen delivery. 1 each 99  . Multiple Vitamin (MULTIVITAMIN WITH MINERALS) TABS tablet Take 1 tablet by mouth daily.    Marland Kitchen omeprazole (PRILOSEC) 20 MG capsule Take 20 mg by mouth daily.    . ondansetron (ZOFRAN-ODT) 4 MG disintegrating tablet Take 4 mg by mouth every 8 (eight) hours as needed for nausea or vomiting.     . Oxycodone HCl 10 MG TABS Take 10 mg by mouth 4 (four) times daily as needed.    . potassium chloride (MICRO-K) 10 MEQ CR capsule Take 10 mEq by mouth 2 (two) times daily.     . prochlorperazine (COMPAZINE) 10 MG tablet Take 10 mg by mouth every 6 (six) hours as needed for nausea or vomiting.     . senna (SENOKOT) 8.6 MG tablet Take 1  tablet by mouth at bedtime.     . Vitamin D, Ergocalciferol, (DRISDOL) 50000 units CAPS capsule Take 1  capsule (50,000 Units total) by mouth every 7 (seven) days. 12 capsule 0  . folic acid (FOLVITE) 1 MG tablet Take 1 tablet (1 mg total) by mouth daily. 90 tablet 4   No current facility-administered medications for this visit.   Facility-Administered Medications Ordered in Other Visits  Medication Dose Route Frequency Provider Last Rate Last Admin  . 0.9 %  sodium chloride infusion   Intravenous Continuous Derek Jack, MD        ALLERGIES:  Allergies  Allergen Reactions  . Quinine Derivatives Nausea And Vomiting  . Sulfa Antibiotics Nausea And Vomiting  . Barium-Containing Compounds   . Percocet [Oxycodone-Acetaminophen] Other (See Comments)    Headahces    PHYSICAL EXAM:  Performance status (ECOG): 2 - Symptomatic, <50% confined to bed  Vitals:   11/09/19 1213  BP: 113/68  Pulse: 98  Resp: 19  Temp: (!) 96.9 F (36.1 C)  SpO2: 98%   Wt Readings from Last 3 Encounters:  11/09/19 179 lb 9.6 oz (81.5 kg)  10/19/19 189 lb 14.4 oz (86.1 kg)  09/21/19 190 lb (86.2 kg)   Physical Exam Vitals reviewed.  Constitutional:      Appearance: Normal appearance.  Cardiovascular:     Rate and Rhythm: Normal rate and regular rhythm.     Pulses: Normal pulses.     Heart sounds: Normal heart sounds.  Pulmonary:     Effort: Pulmonary effort is normal.     Breath sounds: Normal breath sounds.  Chest:     Comments: Port-a-Cath in R chest Neurological:     General: No focal deficit present.     Mental Status: She is alert and oriented to person, place, and time.  Psychiatric:        Mood and Affect: Mood normal.        Behavior: Behavior normal.     LABORATORY DATA:  I have reviewed the labs as listed.  CBC Latest Ref Rng & Units 11/09/2019 10/19/2019 09/27/2019  WBC 4.0 - 10.5 K/uL 5.9 7.0 5.8  Hemoglobin 12.0 - 15.0 g/dL 9.3(L) 8.8(L) 9.9(L)  Hematocrit 36 - 46 %  29.6(L) 29.7(L) 32.4(L)  Platelets 150 - 400 K/uL 277 232 286   CMP Latest Ref Rng & Units 11/09/2019 10/19/2019 09/27/2019  Glucose 70 - 99 mg/dL 117(H) 92 125(H)  BUN 8 - 23 mg/dL '15 18 22  ' Creatinine 0.44 - 1.00 mg/dL 1.29(H) 1.24(H) 1.18(H)  Sodium 135 - 145 mmol/L 133(L) 136 138  Potassium 3.5 - 5.1 mmol/L 4.7 4.1 4.8  Chloride 98 - 111 mmol/L 102 105 105  CO2 22 - 32 mmol/L 17(L) 21(L) 23  Calcium 8.9 - 10.3 mg/dL 9.0 8.6(L) 9.1  Total Protein 6.5 - 8.1 g/dL 7.6 6.9 7.5  Total Bilirubin 0.3 - 1.2 mg/dL 0.4 0.4 0.6  Alkaline Phos 38 - 126 U/L 93 78 75  AST 15 - 41 U/L 45(H) 41 35  ALT 0 - 44 U/L '20 22 20    ' DIAGNOSTIC IMAGING:  I have independently reviewed the scans and discussed with the patient. MR Brain W Wo Contrast  Result Date: 10/11/2019 CLINICAL DATA:  Non-small cell lung cancer. EXAM: MRI HEAD WITHOUT AND WITH CONTRAST TECHNIQUE: Multiplanar, multiecho pulse sequences of the brain and surrounding structures were obtained without and with intravenous contrast. CONTRAST:  30m GADAVIST GADOBUTROL 1 MMOL/ML IV SOLN COMPARISON:  01/27/2017 MRI head.  02/22/2018 head CT. FINDINGS: Brain: Enhancing intracranial lesions include: -1.7 x 1.5 cm inferior right frontal lesion (15:32). -1.5  x 1.3 cm medial left occipital lesion (15:32). -5 mm inferior right frontal lesion (15:30). -5 mm left occipital lesion (15:31). -3 mm left frontal operculum lesion (15:38). The aforementioned lesions demonstrate perilesional edema. The medial left occipital lesion demonstrates minimal intralesional SWI signal dropout. No midline shift, ventriculomegaly or extra-axial fluid collection. Mild cerebral atrophy. Mild background chronic microvascular ischemic changes. Vascular: Major intracranial flow voids are preserved. Skull and upper cervical spine: Normal marrow signal. Sinuses/Orbits: Normal orbits. Sequela of bilateral lens replacement. Mild ethmoid sinus mucosal thickening. Trace right mastoid effusion.  Other: None. IMPRESSION: Multifocal supratentorial metastases as detailed above. Mild cerebral atrophy and chronic microvascular ischemic changes. Electronically Signed   By: Primitivo Gauze M.D.   On: 10/11/2019 12:56     ASSESSMENT:  1. Metastatic non-small cell lung cancer: -History of stage IIIa (T3 N1 M0) non-small cell lung cancer presented with 2 masses in the left upper lobe as well as questionable left hilar adenopathy diagnosed in December 2018. -4 cycles of platinum and pemetrexed from 04/02/2017 through 06/08/2017 -Left upper lobectomy with en blocLLLsuperior segmentectomy on 07/08/2017, pathology showing invasive adenocarcinoma, moderately to poorly differentiated with focal treatment effect and necrosis, 4.3 cm (mass #1) and invasive squamous cell carcinoma, moderately to poorly differentiated, 3.1 cm (mass #2), margins negative, YPT3Y PN 1. -PET scan on 08/04/2018 showed hypermetabolic right apical and retroaortic postradiation changes, intensely hypermetabolic subpleural spiculated nodules/correlating mass in the superior segment of the left lower lobe suspicious for locoregional disease recurrence. Small left pleural effusion demonstrated mild FDG activity. Right hepatic lobe segment 5 hypodensity with intense hypermetabolic activity. Hypermetabolic and markedly enlarged porta hepatis lymph nodes intimately related to the caudate lobe. -MRI of the brain on 09/16/2018 shows 2 ring-enhancing lesions in the parasagittal right frontal lobe and long the left parieto-occipital sulcus measuring 1.7 and 1.3 cm respectively. -4 cycles of carbo/Pem/Pembro from 09/20/2018 through 12/06/2018 -Pembrolizumab maintenance from 01/03/2019 through 04/25/2019, with flare of rheumatoid arthritis. -Started on Remicade on 05/27/2019. -She was switched to pemetrexed maintenance on 06/27/2019. -She moved to [facility]. She was due for her pemetrexed on 08/08/2019. -She walks with the help of her walker and  does very minimal household work. She needs assistance taking shower and clothing herself. -She has tolerated pemetrexed except some tiredness and decreased appetite lasting 2 to 3 days after each treatment. -CT CAP on 09/02/2019 shows masslike consolidation in the left lower lobe measuring 5.3 x 2.2 cm. Right hepatic lobe lesion measures 5.7 x 4.6 cm. Additional 1.6 x 1.3 low-attenuation lesion medial posterior right hepatic lobe. Enlarged porta hepatic and preaortic lymph nodes. Left hemisacral insufficiency fracture. Fracture through the left superior and inferior pubic rami. Wedge compression deformity of T4 vertebral body.  2. Brain metastasis: -Underwent SRS to the brain meta stasis in the left temporal lobe, right frontal and left frontal lobes. -Last MRI of the brain was on 06/15/2019. -Brain MRI on 10/11/2019 shows 1.7 x 1.5 cm inferior right frontal lesion, 1.5 x 1.3 cm medial left occipital lesion, 5 mm inferior right frontal lesion, 5 mm left occipital lesion, 3 mm left frontal operculum lesion. -I have compared them with MRI from [place].  All 5 lesions were present previously and the first 2 lesions have significantly decreased in size.  3. Family history: -Maternal grandmother had colon cancer. -Maternal aunt had throat cancer. -Mother had breast cancer. -Oldest son died of pancreatic cancer.   PLAN:  1. Metastatic non-small cell lung cancer: -She has developed nausea about 2 weeks  ago which was intermittent. -Denied any vomiting.  Last episode of nausea was yesterday.  She is taking Zofran which is helping. -Reviewed labs from today.  Creatinine increased to 1.29.  Will give 500 mL normal saline today. -AST is slightly elevated at 45 with normal bilirubin.  White count and platelet count is adequate to proceed with next cycle of pemetrexed. -Plan to repeat CT chest, abdomen and pelvis prior to next visit in 3 weeks.  2. Brain metastasis: -Brain MRI on 10/11/2019  with no evidence of progression.  3. Family history: -[facility] testing showed CFTR heterozygosity.  4. Rheumatoid arthritis: -Remicade not needed as she is getting chemotherapy.  5. Constipation: -Continue lactulose as needed.   Orders placed this encounter:  Orders Placed This Encounter  Procedures  . CT CHEST ABDOMEN PELVIS W CONTRAST     Derek Jack, MD [facility] [phone]   I, Milinda Antis, am acting as a scribe for Dr. Sanda Linger.  I, Derek Jack MD, have reviewed the above documentation for accuracy and completeness, and I agree with the above.

## 2019-11-09 NOTE — Patient Instructions (Signed)
[facility] at [facility] Discharge Instructions  Labs drawn from portacath today   Thank you for choosing [facility] at [facility] to provide your oncology and hematology care.  To afford each patient quality time with our provider, please arrive at least 15 minutes before your scheduled appointment time.   If you have a lab appointment with the Cancer Center please come in thru the Main Entrance and check in at the main information desk.  You need to re-schedule your appointment should you arrive 10 or more minutes late.  We strive to give you quality time with our providers, and arriving late affects you and other patients whose appointments are after yours.  Also, if you no show three or more times for appointments you may be dismissed from the clinic at the providers discretion.     Again, thank you for choosing [facility].  Our hope is that these requests will decrease the amount of time that you wait before being seen by our physicians.       _____________________________________________________________  Should you have questions after your visit to [facility], please contact our office at [phone] and follow the prompts.  Our office hours are 8:00 a.m. and 4:30 p.m. Monday - Friday.  Please note that voicemails left after 4:00 p.m. may not be returned until the following business day.  We are closed weekends and major holidays.  You do have access to a nurse 24-7, just call the main number to the clinic [phone] and do not press any options, hold on the line and a nurse will answer the phone.    For prescription refill requests, have your pharmacy contact our office and allow 72 hours.    Due to Covid, you will need to wear a mask upon entering the hospital. If you do not have a mask, a mask will be given to you at the Main Entrance upon arrival. For doctor visits, patients may have 1 support person age 18  or older with them. For treatment visits, patients can not have anyone with them due to social distancing guidelines and our immunocompromised population.     

## 2019-11-09 NOTE — Progress Notes (Signed)
Treatment given today per MD orders. Tolerated infusion without adverse affects. Vital signs stable. No complaints at this time. Discharged from clinic via wheel chair accompanied by CNS in stable condition. Alert and oriented x 3. F/U with [facility] as scheduled.

## 2019-11-09 NOTE — Progress Notes (Signed)
Add Emend 150 mg IVPB to plan due to patient experiencing nausea.  Takes oral dexamethasone morning of treatment so no additional dexamethasone.  PA approved per Lendell Caprice.  T.O. Dr Jim Desanctis, RN/Arnella Pralle Ronnald Ramp, PharmD

## 2019-11-18 DIAGNOSIS — R519 Headache, unspecified: Secondary | ICD-10-CM | POA: Diagnosis not present

## 2019-11-18 DIAGNOSIS — Z743 Need for continuous supervision: Secondary | ICD-10-CM | POA: Diagnosis not present

## 2019-11-18 DIAGNOSIS — R6889 Other general symptoms and signs: Secondary | ICD-10-CM | POA: Diagnosis not present

## 2019-11-18 DIAGNOSIS — R52 Pain, unspecified: Secondary | ICD-10-CM | POA: Diagnosis not present

## 2019-11-21 DIAGNOSIS — C3490 Malignant neoplasm of unspecified part of unspecified bronchus or lung: Secondary | ICD-10-CM | POA: Diagnosis not present

## 2019-11-28 ENCOUNTER — Encounter (HOSPITAL_COMMUNITY): Payer: Self-pay | Admitting: *Deleted

## 2019-11-28 ENCOUNTER — Encounter (HOSPITAL_COMMUNITY): Payer: Self-pay

## 2019-11-28 ENCOUNTER — Ambulatory Visit (HOSPITAL_COMMUNITY): Payer: Medicare Other

## 2019-11-28 NOTE — Progress Notes (Signed)
Call received from patient's husband requesting transport assistance for Wednesday's appt. Patient transport arranged via [employer]. Patient's husband aware and is agreeable to have the patient in the wheelchair prior to [facility] [employer]'s arrival at 1200 and to accept the $45 cost of transportation.

## 2019-11-28 NOTE — Progress Notes (Signed)
Patient's husband called clinic in tears stating that he needs help with his wife. He wants to call Hospice because he can't care for his wife at home.  I listened to his concerns: altered mental status, altered orientation to place, hard to take medications, no appetite, takes few bites of food daily, falls frequently due to decreased strength in lower extremities.  He is begging for help.  I have asked him to have her brought to the ER for evaluation, and explained that these symptoms could be coming from her known mets to brain from her lung cancer, infection or other reasons.  I explained that we can't do anything via telephone, that she needs in person evaluation. He is concerned because he can't bring her here, he simply can't pick her up.  I have offered to have him call 911 and have them come and he said it costs too much out of pocket if she denies transport.    I spoke to the patient on the telephone and she thinks she is in the hospital in [place], [place] and she needs her mask on because she is in the hospital.  She thinks that the doctors are down stairs and they can just come up and see her.  I have tried to orient her to who I am and where I am calling from. I explained to her that she needs to come to the hospital for evaluation.  She became oriented and told me that [name] was her doctor and if he wants to see her then she will come in.  She fears that she is confused because she has so many phone calls from different places.  She said that she knows what is going on. She has fallen because her husband doesn't sleep in the same room and when she needs him, she tries to get up and then due to lack of strength , she falls.  She said she feels fine.  She denies any other symptoms at this time.  She does say that she is hungry but when she tries to eat, she immediately feels full.    I have offered to her to come to the ER for evaluation, she states that she will wait until her appt with  [place] on Wednesday.    I have spoken back with Thayer Jew, her husband and advised him of our conversation.  I explained that we could do a palliative care consult and have them come assess her within the next few days.  I also explained that I highly recommend her being evaluated by emergency room physicians today.  He said he isn't doing it at this time.  He is agreeable to palliative care services.  He verbalizes understanding of our conversation and wants to call patient's daughter to tell her the same.

## 2019-11-29 ENCOUNTER — Emergency Department (HOSPITAL_COMMUNITY): Payer: Medicare Other

## 2019-11-29 ENCOUNTER — Inpatient Hospital Stay (HOSPITAL_COMMUNITY)
Admission: EM | Admit: 2019-11-29 | Discharge: 2019-12-01 | DRG: 689 | Disposition: A | Discharge status: Home Health | Payer: Medicare Other | Attending: Family Medicine | Admitting: Family Medicine

## 2019-11-29 ENCOUNTER — Other Ambulatory Visit: Payer: Self-pay

## 2019-11-29 ENCOUNTER — Encounter (HOSPITAL_COMMUNITY): Payer: Self-pay | Admitting: *Deleted

## 2019-11-29 ENCOUNTER — Encounter (HOSPITAL_COMMUNITY): Payer: Self-pay | Admitting: Emergency Medicine

## 2019-11-29 DIAGNOSIS — Z7983 Long term (current) use of bisphosphonates: Secondary | ICD-10-CM

## 2019-11-29 DIAGNOSIS — K219 Gastro-esophageal reflux disease without esophagitis: Secondary | ICD-10-CM | POA: Diagnosis present

## 2019-11-29 DIAGNOSIS — R410 Disorientation, unspecified: Secondary | ICD-10-CM | POA: Diagnosis not present

## 2019-11-29 DIAGNOSIS — R0902 Hypoxemia: Secondary | ICD-10-CM | POA: Diagnosis not present

## 2019-11-29 DIAGNOSIS — G9341 Metabolic encephalopathy: Secondary | ICD-10-CM | POA: Diagnosis not present

## 2019-11-29 DIAGNOSIS — F32A Depression, unspecified: Secondary | ICD-10-CM | POA: Diagnosis not present

## 2019-11-29 DIAGNOSIS — C3492 Malignant neoplasm of unspecified part of left bronchus or lung: Secondary | ICD-10-CM | POA: Diagnosis present

## 2019-11-29 DIAGNOSIS — C787 Secondary malignant neoplasm of liver and intrahepatic bile duct: Secondary | ICD-10-CM | POA: Diagnosis present

## 2019-11-29 DIAGNOSIS — E860 Dehydration: Secondary | ICD-10-CM | POA: Diagnosis present

## 2019-11-29 DIAGNOSIS — Z66 Do not resuscitate: Secondary | ICD-10-CM | POA: Diagnosis not present

## 2019-11-29 DIAGNOSIS — M0579 Rheumatoid arthritis with rheumatoid factor of multiple sites without organ or systems involvement: Secondary | ICD-10-CM

## 2019-11-29 DIAGNOSIS — Z9221 Personal history of antineoplastic chemotherapy: Secondary | ICD-10-CM

## 2019-11-29 DIAGNOSIS — Z20822 Contact with and (suspected) exposure to covid-19: Secondary | ICD-10-CM | POA: Diagnosis present

## 2019-11-29 DIAGNOSIS — Z87891 Personal history of nicotine dependence: Secondary | ICD-10-CM | POA: Diagnosis not present

## 2019-11-29 DIAGNOSIS — Z79899 Other long term (current) drug therapy: Secondary | ICD-10-CM | POA: Diagnosis not present

## 2019-11-29 DIAGNOSIS — N1831 Chronic kidney disease, stage 3a: Secondary | ICD-10-CM | POA: Diagnosis not present

## 2019-11-29 DIAGNOSIS — Z515 Encounter for palliative care: Secondary | ICD-10-CM

## 2019-11-29 DIAGNOSIS — N179 Acute kidney failure, unspecified: Secondary | ICD-10-CM | POA: Diagnosis not present

## 2019-11-29 DIAGNOSIS — Z90710 Acquired absence of both cervix and uterus: Secondary | ICD-10-CM | POA: Diagnosis not present

## 2019-11-29 DIAGNOSIS — R296 Repeated falls: Secondary | ICD-10-CM | POA: Diagnosis present

## 2019-11-29 DIAGNOSIS — Z7189 Other specified counseling: Secondary | ICD-10-CM

## 2019-11-29 DIAGNOSIS — Z96651 Presence of right artificial knee joint: Secondary | ICD-10-CM | POA: Diagnosis not present

## 2019-11-29 DIAGNOSIS — I10 Essential (primary) hypertension: Secondary | ICD-10-CM | POA: Diagnosis not present

## 2019-11-29 DIAGNOSIS — R4182 Altered mental status, unspecified: Secondary | ICD-10-CM | POA: Diagnosis present

## 2019-11-29 DIAGNOSIS — C7931 Secondary malignant neoplasm of brain: Secondary | ICD-10-CM | POA: Diagnosis not present

## 2019-11-29 DIAGNOSIS — Z743 Need for continuous supervision: Secondary | ICD-10-CM | POA: Diagnosis not present

## 2019-11-29 DIAGNOSIS — I129 Hypertensive chronic kidney disease with stage 1 through stage 4 chronic kidney disease, or unspecified chronic kidney disease: Secondary | ICD-10-CM | POA: Diagnosis present

## 2019-11-29 DIAGNOSIS — N3000 Acute cystitis without hematuria: Secondary | ICD-10-CM | POA: Diagnosis not present

## 2019-11-29 DIAGNOSIS — R0600 Dyspnea, unspecified: Secondary | ICD-10-CM | POA: Diagnosis not present

## 2019-11-29 DIAGNOSIS — M069 Rheumatoid arthritis, unspecified: Secondary | ICD-10-CM | POA: Diagnosis not present

## 2019-11-29 DIAGNOSIS — R000 Tachycardia, unspecified: Secondary | ICD-10-CM | POA: Diagnosis not present

## 2019-11-29 DIAGNOSIS — R627 Adult failure to thrive: Secondary | ICD-10-CM | POA: Diagnosis present

## 2019-11-29 DIAGNOSIS — N3001 Acute cystitis with hematuria: Principal | ICD-10-CM | POA: Diagnosis present

## 2019-11-29 DIAGNOSIS — N390 Urinary tract infection, site not specified: Secondary | ICD-10-CM | POA: Diagnosis not present

## 2019-11-29 LAB — CBC WITH DIFFERENTIAL/PLATELET
Abs Immature Granulocytes: 0.06 10*3/uL (ref 0.00–0.07)
Basophils Absolute: 0 10*3/uL (ref 0.0–0.1)
Basophils Relative: 0 %
Eosinophils Absolute: 0.1 10*3/uL (ref 0.0–0.5)
Eosinophils Relative: 1 %
HCT: 29.1 % — ABNORMAL LOW (ref 36.0–46.0)
Hemoglobin: 8.9 g/dL — ABNORMAL LOW (ref 12.0–15.0)
Immature Granulocytes: 1 %
Lymphocytes Relative: 17 %
Lymphs Abs: 1.3 10*3/uL (ref 0.7–4.0)
MCH: 33.5 pg (ref 26.0–34.0)
MCHC: 30.6 g/dL (ref 30.0–36.0)
MCV: 109.4 fL — ABNORMAL HIGH (ref 80.0–100.0)
Monocytes Absolute: 1.3 10*3/uL — ABNORMAL HIGH (ref 0.1–1.0)
Monocytes Relative: 16 %
Neutro Abs: 5.2 10*3/uL (ref 1.7–7.7)
Neutrophils Relative %: 65 %
Platelets: 278 10*3/uL (ref 150–400)
RBC: 2.66 MIL/uL — ABNORMAL LOW (ref 3.87–5.11)
RDW: 17.4 % — ABNORMAL HIGH (ref 11.5–15.5)
WBC: 8 10*3/uL (ref 4.0–10.5)
nRBC: 0 % (ref 0.0–0.2)

## 2019-11-29 LAB — URINALYSIS, ROUTINE W REFLEX MICROSCOPIC
Bilirubin Urine: NEGATIVE
Glucose, UA: NEGATIVE mg/dL
Ketones, ur: 5 mg/dL — AB
Nitrite: NEGATIVE
Protein, ur: 100 mg/dL — AB
Specific Gravity, Urine: 1.015 (ref 1.005–1.030)
WBC, UA: >50 WBC/hpf — ABNORMAL HIGH (ref 0–5)
pH: 5 (ref 5.0–8.0)

## 2019-11-29 LAB — COMPREHENSIVE METABOLIC PANEL
ALT: 25 U/L (ref 0–44)
AST: 42 U/L — ABNORMAL HIGH (ref 15–41)
Albumin: 3 g/dL — ABNORMAL LOW (ref 3.5–5.0)
Alkaline Phosphatase: 98 U/L (ref 38–126)
Anion gap: 11 (ref 5–15)
BUN: 17 mg/dL (ref 8–23)
CO2: 22 mmol/L (ref 22–32)
Calcium: 8.8 mg/dL — ABNORMAL LOW (ref 8.9–10.3)
Chloride: 105 mmol/L (ref 98–111)
Creatinine, Ser: 1.28 mg/dL — ABNORMAL HIGH (ref 0.44–1.00)
GFR, Estimated: 45 mL/min — ABNORMAL LOW (ref >60)
Glucose, Bld: 100 mg/dL — ABNORMAL HIGH (ref 70–99)
Potassium: 3.7 mmol/L (ref 3.5–5.1)
Sodium: 138 mmol/L (ref 135–145)
Total Bilirubin: 0.7 mg/dL (ref 0.3–1.2)
Total Protein: 7.2 g/dL (ref 6.5–8.1)

## 2019-11-29 LAB — RESPIRATORY PANEL BY RT PCR (FLU A&B, COVID)
Influenza A by PCR: NEGATIVE
Influenza B by PCR: NEGATIVE
SARS Coronavirus 2 by RT PCR: NEGATIVE

## 2019-11-29 LAB — CBG MONITORING, ED: Glucose-Capillary: 82 mg/dL (ref 70–99)

## 2019-11-29 MED ORDER — ACETAMINOPHEN 650 MG RE SUPP: 650.0000 mg | Freq: Four times a day (QID) | RECTAL | Status: DC | PRN

## 2019-11-29 MED ORDER — LACTULOSE 10 GM/15ML PO SOLN: 20.0000 g | Freq: Every evening | ORAL | Status: DC | PRN

## 2019-11-29 MED ORDER — LOSARTAN POTASSIUM-HCTZ 50-12.5 MG PO TABS: 1.0000 | ORAL_TABLET | Freq: Every day | ORAL | Status: DC

## 2019-11-29 MED ORDER — HYDROCHLOROTHIAZIDE 12.5 MG PO CAPS
12.5000 mg | ORAL_CAPSULE | Freq: Every day | ORAL | Status: DC
  Administered 2019-11-30 – 2019-12-01 (×2): 12.5 mg via ORAL
  Filled 2019-11-29 (×2): qty 1

## 2019-11-29 MED ORDER — SODIUM CHLORIDE 0.9 % IV BOLUS
1000.0000 mL | Freq: Once | INTRAVENOUS | Status: AC
  Administered 2019-11-29: 1000 mL via INTRAVENOUS

## 2019-11-29 MED ORDER — DEXAMETHASONE 4 MG PO TABS: 4.0000 mg | ORAL_TABLET | Freq: Every day | ORAL | Status: DC

## 2019-11-29 MED ORDER — HYDROXYCHLOROQUINE SULFATE 200 MG PO TABS
400.0000 mg | ORAL_TABLET | ORAL | Status: DC
  Administered 2019-11-29: 400 mg via ORAL
  Filled 2019-11-29: qty 2

## 2019-11-29 MED ORDER — GABAPENTIN 100 MG PO CAPS
100.0000 mg | ORAL_CAPSULE | Freq: Three times a day (TID) | ORAL | Status: DC
  Administered 2019-11-29 – 2019-12-01 (×6): 100 mg via ORAL
  Filled 2019-11-29 (×6): qty 1

## 2019-11-29 MED ORDER — LORATADINE 10 MG PO TABS
10.0000 mg | ORAL_TABLET | Freq: Every day | ORAL | Status: DC
  Administered 2019-11-29 – 2019-12-01 (×3): 10 mg via ORAL
  Filled 2019-11-29 (×3): qty 1

## 2019-11-29 MED ORDER — ONDANSETRON 4 MG PO TBDP: 4.0000 mg | ORAL_TABLET | Freq: Three times a day (TID) | ORAL | Status: DC | PRN

## 2019-11-29 MED ORDER — ENOXAPARIN SODIUM 40 MG/0.4ML ~~LOC~~ SOLN
40.0000 mg | SUBCUTANEOUS | Status: DC
  Administered 2019-11-29 – 2019-11-30 (×2): 40 mg via SUBCUTANEOUS
  Filled 2019-11-29 (×2): qty 0.4

## 2019-11-29 MED ORDER — POTASSIUM CHLORIDE CRYS ER 20 MEQ PO TBCR
20.0000 meq | EXTENDED_RELEASE_TABLET | Freq: Every day | ORAL | Status: DC
  Administered 2019-11-29 – 2019-12-01 (×3): 20 meq via ORAL
  Filled 2019-11-29 (×3): qty 1

## 2019-11-29 MED ORDER — OXYCODONE HCL 5 MG PO TABS: 10.0000 mg | ORAL_TABLET | Freq: Four times a day (QID) | ORAL | Status: DC | PRN

## 2019-11-29 MED ORDER — SODIUM CHLORIDE 0.45 % IV SOLN: INTRAVENOUS | Status: DC

## 2019-11-29 MED ORDER — AMITRIPTYLINE HCL 25 MG PO TABS
100.0000 mg | ORAL_TABLET | Freq: Every day | ORAL | Status: DC
  Administered 2019-11-29 – 2019-11-30 (×2): 100 mg via ORAL
  Filled 2019-11-29: qty 4
  Filled 2019-11-29: qty 1
  Filled 2019-11-29: qty 4

## 2019-11-29 MED ORDER — HYDRALAZINE HCL 25 MG PO TABS
25.0000 mg | ORAL_TABLET | Freq: Every day | ORAL | Status: DC
  Administered 2019-11-29 – 2019-12-01 (×3): 25 mg via ORAL
  Filled 2019-11-29 (×3): qty 1

## 2019-11-29 MED ORDER — PROCHLORPERAZINE MALEATE 5 MG PO TABS: 10.0000 mg | ORAL_TABLET | Freq: Four times a day (QID) | ORAL | Status: DC | PRN

## 2019-11-29 MED ORDER — LEFLUNOMIDE 20 MG PO TABS
20.0000 mg | ORAL_TABLET | Freq: Every day | ORAL | Status: DC
  Administered 2019-11-30 – 2019-12-01 (×2): 20 mg via ORAL
  Filled 2019-11-29 (×4): qty 1

## 2019-11-29 MED ORDER — DIPHENHYDRAMINE HCL 25 MG PO CAPS: 25.0000 mg | ORAL_CAPSULE | Freq: Four times a day (QID) | ORAL | Status: DC | PRN

## 2019-11-29 MED ORDER — ACETAMINOPHEN 325 MG PO TABS: 650.0000 mg | ORAL_TABLET | Freq: Four times a day (QID) | ORAL | Status: DC | PRN

## 2019-11-29 MED ORDER — FOLIC ACID 1 MG PO TABS
1.0000 mg | ORAL_TABLET | Freq: Every day | ORAL | Status: DC
  Administered 2019-11-29 – 2019-12-01 (×3): 1 mg via ORAL
  Filled 2019-11-29 (×3): qty 1

## 2019-11-29 MED ORDER — LOSARTAN POTASSIUM 50 MG PO TABS
50.0000 mg | ORAL_TABLET | Freq: Every day | ORAL | Status: DC
  Administered 2019-11-30 – 2019-12-01 (×2): 50 mg via ORAL
  Filled 2019-11-29 (×2): qty 1

## 2019-11-29 MED ORDER — SODIUM CHLORIDE 0.9 % IV SOLN
1.0000 g | Freq: Once | INTRAVENOUS | Status: AC
  Administered 2019-11-29: 1 g via INTRAVENOUS
  Filled 2019-11-29: qty 10

## 2019-11-29 MED ORDER — PANTOPRAZOLE SODIUM 40 MG PO TBEC
40.0000 mg | DELAYED_RELEASE_TABLET | Freq: Every day | ORAL | Status: DC
  Administered 2019-11-29 – 2019-12-01 (×3): 40 mg via ORAL
  Filled 2019-11-29 (×3): qty 1

## 2019-11-29 MED ORDER — SENNA 8.6 MG PO TABS
1.0000 | ORAL_TABLET | Freq: Every day | ORAL | Status: DC
  Administered 2019-11-29 – 2019-11-30 (×2): 8.6 mg via ORAL
  Filled 2019-11-29 (×3): qty 1

## 2019-11-29 NOTE — ED Notes (Signed)
Patient given ice water per request.

## 2019-11-29 NOTE — H&P (Signed)
History and Physical    CHAVY AVERA WPY:[id number] DOB: 11/18/1950 DOA: 11/29/2019  PCP: Jake Samples, PA-C (Confirm with patient/family/NH records and if not entered, this has to be entered at [facility] point of entry) Patient coming from: Home  I have personally briefly reviewed patient's old medical records in [place]  Chief Complaint: altered mental status  HPI: Rose Lutz is a 69 y.o. 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 She has had declining health and requires full assistance with ADLs. Her appetite is poor. She is frequently confused and disoriented. She has had multiple falls during episodes of confusion.  Over the past 48 hours her condition has become acutely worse. She is brought by EMS to [facility] for evaluation.   ED Course: T 98.4  HR 101-112  RR 16,  154/91. On exam by EDP she is mildly confused and generally weak. Lab reveals chronic renal insufficiency with Cr 1.28, up from baseline of 1.18. CBC normal, Covid/Flu negative. U/A cloudlywith large LE, many bacteria >50 WBC/hpf. CT head with increased edema around occipital metastatic lesion, other brain lesions stable. [facility] called to admit patient for treatment.  Review of Systems: As per HPI otherwise 10 point review of systems negative.    Past Medical History:  Diagnosis Date  . Chronic pain   . Depression   . Essential hypertension   . Family history of breast cancer   . Family history of colon cancer   . Family history of pancreatic cancer   . GERD (gastroesophageal reflux disease)   . History of lung cancer 06/2017  . Osteoarthritis   . Pulmonary asbestosis ([place])   . Rheumatoid arthritis [facility])     Past Surgical History:  Procedure Laterality Date  . ABDOMINAL HYSTERECTOMY    . BILATERAL OOPHORECTOMY    . CESAREAN SECTION     x3  . FEMUR FRACTURE SURGERY    . KNEE ARTHROPLASTY Right   . LUNG CANCER SURGERY Left 07/08/2017  .  pelvis fracture/surgery     Dr. Cheri Rous  . PORTACATH PLACEMENT Right 09/05/2019   Procedure: INSERTION PORT-A-CATH;  Surgeon: Aviva Signs, MD;  Location: [facility];  Service: General;  Laterality: Right;  . TONSILLECTOMY    . VIDEO BRONCHOSCOPY WITH ENDOBRONCHIAL NAVIGATION Left 12/31/2016   Procedure: VIDEO BRONCHOSCOPY WITH ENDOBRONCHIAL NAVIGATION, left upper lung lobe;  Surgeon: Collene Gobble, MD;  Location: [place];  Service: Thoracic;  Laterality: Left;  . YAG LASER APPLICATION Right 05/39/7673   Procedure: YAG LASER APPLICATION;  Surgeon: Rutherford Guys, MD;  Location: [facility];  Service: Ophthalmology;  Laterality: Right;  . YAG LASER APPLICATION Left 41/93/7902   Procedure: YAG LASER APPLICATION;  Surgeon: Rutherford Guys, MD;  Location: [facility];  Service: Ophthalmology;  Laterality: Left;    Soc hx - 1st marriage ended in divorce, 2nd marriage ended in divorce, 83rd marriage in 27th year. She has three children and 3 grandchildren. She worked in [employer]. She has family in [place] and wishes to be buried there. Her daughter is interested in bringing her to [place] for end of life care when it is appropriate.    reports that she quit smoking about 13 years ago. Her smoking use included cigarettes. She started smoking about 51 years ago. She has a 47.50 pack-year smoking history. She has never used smokeless tobacco. She reports that she does not drink alcohol and does not use drugs.  Allergies  Allergen Reactions  . Quinine Derivatives Nausea And Vomiting  . Sulfa Antibiotics Nausea And Vomiting  . Barium-Containing Compounds   . Percocet [Oxycodone-Acetaminophen] Other (See Comments)    Headahces    Family History  Problem Relation Age of Onset  . Breast cancer Mother 80       d. 83  . Colon cancer Maternal Grandmother 74       d. 52  . Pancreatic cancer Son 30  . Healthy Father   . Arthritis Sister   . Arthritis Paternal Grandmother   . Other Son 73       MVA  .  Esophageal cancer Maternal Aunt      Prior to Admission medications   Medication Sig Start Date End Date Taking? Authorizing Provider  alendronate (FOSAMAX) 35 MG tablet Take 35 mg by mouth every 7 (seven) days.  03/25/19  Yes [provider]  amitriptyline (ELAVIL) 100 MG tablet Take 100 mg by mouth at bedtime.   Yes [provider]  Ascorbic Acid (VITAMIN C) 250 MG CHEW Chew 250 mg by mouth daily.  11/13/16  Yes [provider]  cetirizine (ZYRTEC) 10 MG tablet Take 10 mg by mouth daily as needed for allergies.    Yes [provider]  diphenhydrAMINE (BENADRYL) 25 mg capsule Take 25 mg by mouth every 6 (six) hours as needed for itching or allergies.    Yes [provider]  folic acid (FOLVITE) 1 MG tablet Take 1 tablet (1 mg total) by mouth daily. 03/24/17 11/29/19 Yes Deveshwar, Abel Presto, MD  gabapentin (NEURONTIN) 100 MG capsule Take 100 mg by mouth 3 (three) times daily.  09/11/17  Yes [provider]  hydrALAZINE (APRESOLINE) 25 MG tablet Take 25 mg by mouth daily.  03/25/19  Yes [provider]  hydroxychloroquine (PLAQUENIL) 200 MG tablet TAKE 2 TABLETS BY MOUTH  EVERY EVENING. [place] Patient taking differently: Take 400 mg by mouth every Monday, Tuesday, Wednesday, Thursday, and Friday.  09/17/18  Yes Deveshwar, Abel Presto, MD  leflunomide (ARAVA) 10 MG tablet TAKE 2 TABLETS BY MOUTH  DAILY Patient taking differently: Take 20 mg by mouth daily.  09/24/18  Yes Deveshwar, Abel Presto, MD  losartan-hydrochlorothiazide (HYZAAR) 50-12.5 MG tablet Take 1 tablet by mouth daily. 06/29/19  Yes [provider]  [place]. Devices MISC Please provide patient with oxygen via nasal cannula at 2 liters per minute continuous.  Please provide tubing for oxygen delivery. 10/20/19  Yes Derek Jack, MD  Multiple Vitamin (MULTIVITAMIN WITH MINERALS) TABS tablet Take 1 tablet by mouth daily.   Yes [provider]  omeprazole  (PRILOSEC) 20 MG capsule Take 20 mg by mouth daily.   Yes [provider]  ondansetron (ZOFRAN-ODT) 4 MG disintegrating tablet Take 4 mg by mouth every 8 (eight) hours as needed for nausea or vomiting.  09/29/18  Yes [provider]  Oxycodone HCl 10 MG TABS Take 10 mg by mouth 4 (four) times daily as needed. 10/07/19  Yes [provider]  potassium chloride (MICRO-K) 10 MEQ CR capsule Take 10 mEq by mouth 2 (two) times daily.  05/19/19  Yes [provider]  prochlorperazine (COMPAZINE) 10 MG tablet Take 10 mg by mouth every 6 (six) hours as needed for nausea or vomiting.  06/12/19  Yes [provider]  senna (SENOKOT) 8.6 MG tablet Take 1 tablet by mouth at bedtime.  03/25/19  Yes [provider]  Vitamin D, Ergocalciferol, (DRISDOL) 50000 units CAPS  capsule Take 1 capsule (50,000 Units total) by mouth every 7 (seven) days. 12/07/15  Yes Panwala, Naitik, PA-C  dexamethasone (DECADRON) 4 MG tablet Take by mouth. Patient not taking: Reported on 11/29/2019 06/06/19   [provider]  Lactulose 20 GM/30ML SOLN Take 30 mLs (20 g total) by mouth at bedtime as needed. Patient not taking: Reported on 11/29/2019 09/27/19   Derek Jack, MD    Physical Exam: Vitals:   11/29/19 1930 11/29/19 2000 11/29/19 2030 11/29/19 2100  BP: (!) 147/78 (!) 154/91 (!) 148/88 (!) 161/92  Pulse: (!) 106 (!) 107 (!) 108 (!) 110  Resp: 14 16 19 16   Temp:      TempSrc:      SpO2: 95% 96% 95% 96%     Vitals:   11/29/19 1930 11/29/19 2000 11/29/19 2030 11/29/19 2100  BP: (!) 147/78 (!) 154/91 (!) 148/88 (!) 161/92  Pulse: (!) 106 (!) 107 (!) 108 (!) 110  Resp: 14 16 19 16   Temp:      TempSrc:      SpO2: 95% 96% 95% 96%   General: older woman in no distress. Eyes: PERRL, lids and conjunctivae normal ENMT: Mucous membranes are very dry. Posterior pharynx clear of any exudate or lesions.Edentulous.  Neck: normal, supple, no masses, no  thyromegaly Respiratory: clear to auscultation bilaterally, no wheezing, no crackles. Normal respiratory effort. No accessory muscle use.  Cardiovascular: Regular rate and rhythm, no murmurs / rubs / gallops. No extremity edema. 2+ pedal pulses. No carotid bruits.  Abdomen:  Tenderness to palpation suprapubic region, no masses palpated. No hepatosplenomegaly. Bowel sounds positive.  Musculoskeletal: no clubbing / cyanosis. No joint deformity upper and lower extremities. Good ROM, no contractures. Normal muscle tone.  Skin: no rashes, lesions, ulcers. No induration. Porta cath right upper chest Neurologic: CN 2-12 grossly intact. Sensation intact, DTR normal. Strength 5/5 in all 4.  Psychiatric: Normal judgment and insight. Alert and oriented x 3. Normal mood.     Labs on Admission: I have personally reviewed following labs and imaging studies  CBC: Recent Labs  Lab 11/29/19 1631  WBC 8.0  NEUTROABS 5.2  HGB 8.9*  HCT 29.1*  MCV 109.4*  PLT 956   Basic Metabolic Panel: Recent Labs  Lab 11/29/19 1631  NA 138  K 3.7  CL 105  CO2 22  GLUCOSE 100*  BUN 17  CREATININE 1.28*  CALCIUM 8.8*   GFR: CrCl cannot be calculated (Unknown ideal weight.). Liver Function Tests: Recent Labs  Lab 11/29/19 1631  AST 42*  ALT 25  ALKPHOS 98  BILITOT 0.7  PROT 7.2  ALBUMIN 3.0*   No results for input(s): LIPASE, AMYLASE in the last 168 hours. No results for input(s): AMMONIA in the last 168 hours. Coagulation Profile: No results for input(s): INR, PROTIME in the last 168 hours. Cardiac Enzymes: No results for input(s): CKTOTAL, CKMB, CKMBINDEX, TROPONINI in the last 168 hours. BNP (last 3 results) No results for input(s): PROBNP in the last 8760 hours. HbA1C: No results for input(s): HGBA1C in the last 72 hours. CBG: Recent Labs  Lab 11/29/19 1555  GLUCAP 82   Lipid Profile: No results for input(s): CHOL, HDL, LDLCALC, TRIG, CHOLHDL, LDLDIRECT in the last 72  hours. Thyroid Function Tests: No results for input(s): TSH, T4TOTAL, FREET4, T3FREE, THYROIDAB in the last 72 hours. Anemia Panel: No results for input(s): VITAMINB12, FOLATE, FERRITIN, TIBC, IRON, RETICCTPCT in the last 72 hours. Urine analysis:    Component Value Date/Time  COLORURINE YELLOW 11/29/2019 1611   APPEARANCEUR CLOUDY (A) 11/29/2019 1611   LABSPEC 1.015 11/29/2019 1611   PHURINE 5.0 11/29/2019 1611   GLUCOSEU NEGATIVE 11/29/2019 1611   HGBUR SMALL (A) 11/29/2019 1611   BILIRUBINUR NEGATIVE 11/29/2019 1611   KETONESUR 5 (A) 11/29/2019 1611   PROTEINUR 100 (A) 11/29/2019 1611   NITRITE NEGATIVE 11/29/2019 1611   LEUKOCYTESUR LARGE (A) 11/29/2019 1611    Radiological Exams on Admission: CT Head Wo Contrast  Result Date: 11/29/2019 CLINICAL DATA:  Mental status change. EXAM: CT HEAD WITHOUT CONTRAST TECHNIQUE: Contiguous axial images were obtained from the base of the skull through the vertex without intravenous contrast. COMPARISON:  MRI 10/11/2019. FINDINGS: Brain: When comparing across modalities, there appears to be slightly increased edema associated with known metastatic lesion in the medial left occipital lobe, which may represent progressive disease. No substantial mass effect. Grossly similar vasogenic edema associated with additional known metastatic lesions, with lesions better characterized on prior MRI. No evidence of acute large vascular territory infarct. No acute hemorrhage. No hydrocephalus. Vascular: Calcific atherosclerosis. Skull: No acute fracture or abnormal bone lesions identified. Sinuses/Orbits: No acute findings. Other: No mastoid effusions. IMPRESSION: 1. When comparing across modalities, there appears to be slightly increased edema associated with known metastatic lesion in the medial left occipital lobe. No substantial mass effect. MRI with and without contrast could further evaluate if clinically indicated. 2. Otherwise, grossly similar vasogenic edema  associated with additional metastatic lesions which were better characterized on prior MRI. Electronically Signed   By: Margaretha Sheffield MD   On: 11/29/2019 18:04   DG Chest Port 1 View  Result Date: 11/29/2019 CLINICAL DATA:  Dyspnea EXAM: PORTABLE CHEST 1 VIEW COMPARISON:  09/05/2019 FINDINGS: Surgical changes of left upper lobectomy with associated left-sided volume loss is again noted. Opacity at the medial left apex likely represents vascular shadow and/or pleural thickening. Stable scarring at the left lung base. Right lung is clear. No pneumothorax or pleural effusion. Right subclavian chest port tip is unchanged at the expected confluence of the superior vena cava. Cardiac size within normal limits. Advanced degenerative changes are seen within the shoulders. No acute bone abnormality. IMPRESSION: No active disease. Electronically Signed   By: Fidela Salisbury MD   On: 11/29/2019 16:58    EKG: Independently reviewed. Sinus tachycardia  Assessment/Plan Active Problems:   Altered mental status, unspecified   Acute lower UTI   HTN (hypertension)   Nonsquamous nonsmall cell neoplasm of left lung (HCC)   UTI (urinary tract infection)    1. UTI - positive U/A. No evidence of sepsis. Compromised patient on chemo Plan Continue IV abx, rocephin, for another 24 hrs then convert to oral abx if stable  2. AKI - mild bump in creatinine. Very dry mucus membranes. Plan IV hydration for 24-36 hrs  3. HTN- continue home meds  4. Onc - metatstatic lung cancer with brain mets. CT reveals mild increased edema surrounding occipital lesion. Plan Resume decadron 4 mg daily  Continue chemotherapy - due for treatment tomorrow, 11/30/19  5. Care plan/EoL care - patient has given thought to hospice. It has become increasingly difficult for her husband to manage her care at home. He reports that her confusional state and disorientation has dramatically increased from 30% of the time to > 70% of the time.  Patient has made her funeral arrangement and burial arrangements to be done in [place]. She has a daughter who wishes to take her to [place] for end of life  care when appropriate. Plan Palliative Care consult re: eligibility for hospice care  [facility] consult to coordinate care.     DVT prophylaxis: . lovenox   Code Status: DNR Family Communication: Spoke with husband Thayer Jew - he is approaching the point where he cannot adequately care for her. He understands UTI dx and tx plan. He is in favor of TOC/Palliative care consults to plan for care.   Disposition Plan: TBD  Consults called: Palliative care, TOC in AM Admission status: observation    Adella Hare MD [facility] Pager [phone]  If 7PM-7AM, please contact night-coverage [email or web address] Password [email or web address]  11/29/2019, 9:30 PM

## 2019-11-29 NOTE — ED Notes (Signed)
Report given to [facility].

## 2019-11-29 NOTE — Progress Notes (Signed)
I spoke with husband today, he was very upset and is requesting that the nurse that was supposed to come today, hurry up.  I explained to him that a palliative care referral had been done yesterday but they were only supposed to call today to arrange an upcoming appointment.  I didn't mean that they would actually come out to the home today.  He became tearful stating that his wife was "sitting in shit.  I am unable to care for her.  I can't roll her or care for her.  I can't clean her up.  I can't leave her sitting in this shit today.  Who is going to help me clean her up?"  I explained that I was very sorry for being misleading.  I explained that he needs to call friends or family to help care for her if he is unable. He states that all of their family is out of state ([place]arlis Edelson, [place]).  He states that he just wants her to get help.    I talked with patient via phone and advised her that she needs care that her husband is unable to provide.  I explained that she needs to come to the ER just as I suggested yesterday.  She wants to know who is going to care for her after she comes here, if her husband can't care for her now.  I explained that we could do a proper assessment once she gets here and if she needs placement in skilled nursing facility then we could help arrange that.  She verbalizes understanding and agrees to come.   I spoke with both EMT and paramedic via phone and advised them of conversation yesterday and today with patient and her husband.  I advised that we recommend her come to ER for evaluation. They were able to get verbal consent from patient to come to the ER.    I called ER and gave report to [place], Therapist, sports.

## 2019-11-29 NOTE — ED Notes (Signed)
Attempted to call report x2.  Nurse unavailable.

## 2019-11-29 NOTE — ED Notes (Signed)
Attempted to call report x1.  No answer.

## 2019-11-29 NOTE — ED Triage Notes (Signed)
Pt from home via [facility]. Pt reports she was sent because "my husband has it in his mind he can't take care of me." Per EMS pt has cancer that has metastasized to the brain and at times "pt is confused." Pt alert and oriented at this time and denies pain.

## 2019-11-29 NOTE — ED Provider Notes (Signed)
[facility] EMERGENCY DEPARTMENT Provider Note   CSN: [id number] Arrival date & time: 11/29/19  1552     History Chief Complaint  Patient presents with  . Failure To Thrive    Rose Lutz is a 69 y.o. female.  Patient brought to the emergency department today because of general weakness and confusion.  Patient has metastatic lung disease with mets to the brain and liver.  Her husband has been trying to take care of her and states he is unable to do it anymore.  The history is provided by the patient and a relative. No language interpreter was used.  Weakness Severity:  Moderate Onset quality:  Gradual Timing:  Constant Progression:  Worsening Chronicity:  New Relieved by:  Nothing Worsened by:  Nothing Ineffective treatments:  None tried Associated symptoms: no abdominal pain, no chest pain, no cough, no diarrhea, no frequency, no headaches and no seizures        Past Medical History:  Diagnosis Date  . Chronic pain   . Depression   . Essential hypertension   . Family history of breast cancer   . Family history of colon cancer   . Family history of pancreatic cancer   . GERD (gastroesophageal reflux disease)   . History of lung cancer 06/2017  . Osteoarthritis   . Pulmonary asbestosis ([place])   . Rheumatoid arthritis [facility])     Patient Active Problem List   Diagnosis Date Noted  . Genetic testing 09/06/2019  . Nonsquamous nonsmall cell neoplasm of left lung ([place])   . Goals of care, counseling/discussion 08/31/2019  . Family history of pancreatic cancer   . Family history of breast cancer   . Family history of colon cancer   . Metastatic non-small cell lung cancer ([place]) 08/19/2019  . Chondrocalcinosis 02/16/2018  . History of total right knee replacement 09/22/2017  . Primary malignant neoplasm of left upper lobe of lung ([place]) 01/22/2017  . Family history of cancer 01/22/2017  . Mass of left lung 12/19/2016  . Rheumatoid arthritis involving multiple  sites with positive rheumatoid factor ([place]) 05/06/2016  . High risk medications (not anticoagulants) long-term use 05/06/2016  . Primary osteoarthritis of both hands 05/06/2016  . Primary osteoarthritis of left knee 05/06/2016  . Pain in left ankle and joints of left foot 05/06/2016  . Other fatigue 05/06/2016  . Dizziness and giddiness 02/05/2015  . HTN (hypertension) 02/05/2015    Past Surgical History:  Procedure Laterality Date  . ABDOMINAL HYSTERECTOMY    . BILATERAL OOPHORECTOMY    . CESAREAN SECTION     x3  . FEMUR FRACTURE SURGERY    . KNEE ARTHROPLASTY Right   . LUNG CANCER SURGERY Left 07/08/2017  . pelvis fracture/surgery     Dr. Cheri Rous  . PORTACATH PLACEMENT Right 09/05/2019   Procedure: INSERTION PORT-A-CATH;  Surgeon: Aviva Signs, MD;  Location: [facility];  Service: General;  Laterality: Right;  . TONSILLECTOMY    . VIDEO BRONCHOSCOPY WITH ENDOBRONCHIAL NAVIGATION Left 12/31/2016   Procedure: VIDEO BRONCHOSCOPY WITH ENDOBRONCHIAL NAVIGATION, left upper lung lobe;  Surgeon: Collene Gobble, MD;  Location: [place];  Service: Thoracic;  Laterality: Left;  . YAG LASER APPLICATION Right 38/46/6599   Procedure: YAG LASER APPLICATION;  Surgeon: Rutherford Guys, MD;  Location: [facility];  Service: Ophthalmology;  Laterality: Right;  . YAG LASER APPLICATION Left 35/70/1779   Procedure: YAG LASER APPLICATION;  Surgeon: Rutherford Guys, MD;  Location: [facility];  Service: Ophthalmology;  Laterality: Left;  OB History   No obstetric history on file.     Family History  Problem Relation Age of Onset  . Breast cancer Mother 38       d. 64  . Colon cancer Maternal Grandmother 56       d. 36  . Pancreatic cancer Son 35  . Healthy Father   . Arthritis Sister   . Arthritis Paternal Grandmother   . Other Son 64       MVA  . Esophageal cancer Maternal Aunt     Social History   Tobacco Use  . Smoking status: Former Smoker    Packs/day: 1.25    Years: 38.00    Pack years: 47.50     Types: Cigarettes    Start date: 01/21/1968    Quit date: 01/20/2006    Years since quitting: 13.8  . Smokeless tobacco: Never Used  Vaping Use  . Vaping Use: Never used  Substance Use Topics  . Alcohol use: No    Alcohol/week: 0.0 standard drinks  . Drug use: No    Home Medications Prior to Admission medications   Medication Sig Start Date End Date Taking? Authorizing Provider  alendronate (FOSAMAX) 35 MG tablet Take 35 mg by mouth every 7 (seven) days.  03/25/19  Yes [provider]  amitriptyline (ELAVIL) 100 MG tablet Take 100 mg by mouth at bedtime.   Yes [provider]  Ascorbic Acid (VITAMIN C) 250 MG CHEW Chew 250 mg by mouth daily.  11/13/16  Yes [provider]  cetirizine (ZYRTEC) 10 MG tablet Take 10 mg by mouth daily as needed for allergies.    Yes [provider]  diphenhydrAMINE (BENADRYL) 25 mg capsule Take 25 mg by mouth every 6 (six) hours as needed for itching or allergies.    Yes [provider]  folic acid (FOLVITE) 1 MG tablet Take 1 tablet (1 mg total) by mouth daily. 03/24/17 11/29/19 Yes Deveshwar, Abel Presto, MD  gabapentin (NEURONTIN) 100 MG capsule Take 100 mg by mouth 3 (three) times daily.  09/11/17  Yes [provider]  hydrALAZINE (APRESOLINE) 25 MG tablet Take 25 mg by mouth daily.  03/25/19  Yes [provider]  hydroxychloroquine (PLAQUENIL) 200 MG tablet TAKE 2 TABLETS BY MOUTH  EVERY EVENING. Gas Patient taking differently: Take 400 mg by mouth every Monday, Tuesday, Wednesday, Thursday, and Friday.  09/17/18  Yes Deveshwar, Abel Presto, MD  leflunomide (ARAVA) 10 MG tablet TAKE 2 TABLETS BY MOUTH  DAILY Patient taking differently: Take 20 mg by mouth daily.  09/24/18  Yes Deveshwar, Abel Presto, MD  losartan-hydrochlorothiazide (HYZAAR) 50-12.5 MG tablet Take 1 tablet by mouth daily. 06/29/19  Yes [provider]  [place]. Devices MISC Please provide patient with oxygen via nasal cannula at 2  liters per minute continuous.  Please provide tubing for oxygen delivery. 10/20/19  Yes Derek Jack, MD  Multiple Vitamin (MULTIVITAMIN WITH MINERALS) TABS tablet Take 1 tablet by mouth daily.   Yes [provider]  omeprazole (PRILOSEC) 20 MG capsule Take 20 mg by mouth daily.   Yes [provider]  ondansetron (ZOFRAN-ODT) 4 MG disintegrating tablet Take 4 mg by mouth every 8 (eight) hours as needed for nausea or vomiting.  09/29/18  Yes [provider]  Oxycodone HCl 10 MG TABS Take 10 mg by mouth 4 (four) times daily as needed. 10/07/19  Yes [provider]  potassium chloride (MICRO-K) 10 MEQ CR capsule Take 10 mEq by  mouth 2 (two) times daily.  05/19/19  Yes [provider]  prochlorperazine (COMPAZINE) 10 MG tablet Take 10 mg by mouth every 6 (six) hours as needed for nausea or vomiting.  06/12/19  Yes [provider]  senna (SENOKOT) 8.6 MG tablet Take 1 tablet by mouth at bedtime.  03/25/19  Yes [provider]  Vitamin D, Ergocalciferol, (DRISDOL) 50000 units CAPS capsule Take 1 capsule (50,000 Units total) by mouth every 7 (seven) days. 12/07/15  Yes Panwala, Naitik, PA-C  dexamethasone (DECADRON) 4 MG tablet Take by mouth. Patient not taking: Reported on 11/29/2019 06/06/19   [provider]  Lactulose 20 GM/30ML SOLN Take 30 mLs (20 g total) by mouth at bedtime as needed. Patient not taking: Reported on 11/29/2019 09/27/19   Derek Jack, MD    Allergies    Quinine derivatives, Sulfa antibiotics, Barium-containing compounds, and Percocet [oxycodone-acetaminophen]  Review of Systems   Review of Systems  Constitutional: Negative for appetite change and fatigue.  HENT: Negative for congestion, ear discharge and sinus pressure.   Eyes: Negative for discharge.  Respiratory: Negative for cough.   Cardiovascular: Negative for chest pain.  Gastrointestinal: Negative for abdominal pain and diarrhea.   Genitourinary: Negative for frequency and hematuria.  Musculoskeletal: Negative for back pain.  Skin: Negative for rash.  Neurological: Positive for weakness. Negative for seizures and headaches.  Psychiatric/Behavioral: Negative for hallucinations.    Physical Exam Updated Vital Signs BP (!) 155/81   Pulse (!) 106   Temp 98.4 F (36.9 C) (Oral)   Resp 20   SpO2 100%   Physical Exam Vitals and nursing note reviewed.  Constitutional:      Appearance: She is well-developed.     Comments: Patient alert mildly confused  HENT:     Head: Normocephalic.     Nose: Nose normal.  Eyes:     General: No scleral icterus.    Conjunctiva/sclera: Conjunctivae normal.  Neck:     Thyroid: No thyromegaly.  Cardiovascular:     Rate and Rhythm: Normal rate and regular rhythm.     Heart sounds: No murmur heard.  No friction rub. No gallop.   Pulmonary:     Breath sounds: No stridor. No wheezing or rales.  Chest:     Chest wall: No tenderness.  Abdominal:     General: There is no distension.     Tenderness: There is no abdominal tenderness. There is no rebound.  Musculoskeletal:        General: Normal range of motion.     Cervical back: Neck supple.     Comments: Patient has general weakness throughout is able to move extremities but not strong enough to walk  Lymphadenopathy:     Cervical: No cervical adenopathy.  Skin:    Findings: No erythema or rash.  Neurological:     Mental Status: She is oriented to person, place, and time.     Motor: No abnormal muscle tone.     Coordination: Coordination normal.  Psychiatric:        Behavior: Behavior normal.     ED Results / Procedures / Treatments   Labs (all labs ordered are listed, but only abnormal results are displayed) Labs Reviewed  CBC WITH DIFFERENTIAL/PLATELET - Abnormal; Notable for the following components:      Result Value   RBC 2.66 (*)    Hemoglobin 8.9 (*)    HCT 29.1 (*)    MCV 109.4 (*)    RDW 17.4 (*)  Monocytes Absolute 1.3 (*)    All other components within normal limits  COMPREHENSIVE METABOLIC PANEL - Abnormal; Notable for the following components:   Glucose, Bld 100 (*)    Creatinine, Ser 1.28 (*)    Calcium 8.8 (*)    Albumin 3.0 (*)    AST 42 (*)    GFR, Estimated 45 (*)    All other components within normal limits  URINALYSIS, ROUTINE W REFLEX MICROSCOPIC - Abnormal; Notable for the following components:   APPearance CLOUDY (*)    Hgb urine dipstick SMALL (*)    Ketones, ur 5 (*)    Protein, ur 100 (*)    Leukocytes,Ua LARGE (*)    WBC, UA >50 (*)    Bacteria, UA MANY (*)    Non Squamous Epithelial 0-5 (*)    All other components within normal limits  RESPIRATORY PANEL BY RT PCR (FLU A&B, COVID)  URINE CULTURE  CBG MONITORING, ED    EKG None  Radiology CT Head Wo Contrast  Result Date: 11/29/2019 CLINICAL DATA:  Mental status change. EXAM: CT HEAD WITHOUT CONTRAST TECHNIQUE: Contiguous axial images were obtained from the base of the skull through the vertex without intravenous contrast. COMPARISON:  MRI 10/11/2019. FINDINGS: Brain: When comparing across modalities, there appears to be slightly increased edema associated with known metastatic lesion in the medial left occipital lobe, which may represent progressive disease. No substantial mass effect. Grossly similar vasogenic edema associated with additional known metastatic lesions, with lesions better characterized on prior MRI. No evidence of acute large vascular territory infarct. No acute hemorrhage. No hydrocephalus. Vascular: Calcific atherosclerosis. Skull: No acute fracture or abnormal bone lesions identified. Sinuses/Orbits: No acute findings. Other: No mastoid effusions. IMPRESSION: 1. When comparing across modalities, there appears to be slightly increased edema associated with known metastatic lesion in the medial left occipital lobe. No substantial mass effect. MRI with and without contrast could further  evaluate if clinically indicated. 2. Otherwise, grossly similar vasogenic edema associated with additional metastatic lesions which were better characterized on prior MRI. Electronically Signed   By: Margaretha Sheffield MD   On: 11/29/2019 18:04   DG Chest Port 1 View  Result Date: 11/29/2019 CLINICAL DATA:  Dyspnea EXAM: PORTABLE CHEST 1 VIEW COMPARISON:  09/05/2019 FINDINGS: Surgical changes of left upper lobectomy with associated left-sided volume loss is again noted. Opacity at the medial left apex likely represents vascular shadow and/or pleural thickening. Stable scarring at the left lung base. Right lung is clear. No pneumothorax or pleural effusion. Right subclavian chest port tip is unchanged at the expected confluence of the superior vena cava. Cardiac size within normal limits. Advanced degenerative changes are seen within the shoulders. No acute bone abnormality. IMPRESSION: No active disease. Electronically Signed   By: Fidela Salisbury MD   On: 11/29/2019 16:58    Procedures Procedures (including critical care time)  Medications Ordered in ED Medications  cefTRIAXone (ROCEPHIN) 1 g in sodium chloride 0.9 % 100 mL IVPB (1 g Intravenous New Bag/Given 11/29/19 1850)  sodium chloride 0.9 % bolus 1,000 mL (0 mLs Intravenous Stopped 11/29/19 1813)    ED Course  I have reviewed the triage vital signs and the nursing notes.  Pertinent labs & imaging results that were available during my care of the patient were reviewed by me and considered in my medical decision making (see chart for details).    MDM Rules/Calculators/A&P  Patient with metastatic lung cancer.  Extreme weakness and urinary tract infection.  She will be admitted to medicine and possible nursing home placement       This patient presents to the ED for concern of weakness, this involves an extensive number of treatment options, and is a complaint that carries with it a high risk of complications  and morbidity.  The differential diagnosis includes metastatic lung cancer   Lab Tests:   I Ordered, reviewed, and interpreted labs, which included CBC chemistries shows anemia and mild elevated creatinine low albumin  Medicines ordered:   I ordered medication Rocephin for UTI  Imaging Studies ordered:   I ordered imaging studies which included chest x-ray and CT head  I independently visualized and interpreted imaging which showed unremarkable chest x-ray with CT head showing brain mets  Additional history obtained:   Additional history obtained from husband  Previous records obtained and reviewed.  Consultations Obtained:   I consulted hospitalist and discussed lab and imaging findings  Reevaluation:  After the interventions stated above, I reevaluated the patient and found no improvement  Critical Interventions:  .   Final Clinical Impression(s) / ED Diagnoses Final diagnoses:  Acute cystitis with hematuria    Rx / DC Orders ED Discharge Orders    None       Milton Ferguson, MD 11/29/19 [phone]

## 2019-11-29 NOTE — ED Notes (Signed)
Received care of patient resting in bed.  0 s/s acute distress.  Call bell in reach. 

## 2019-11-29 NOTE — ED Notes (Signed)
Pt refused in and out cath. Patient has a purwick on currently.

## 2019-11-30 ENCOUNTER — Inpatient Hospital Stay (HOSPITAL_COMMUNITY): Payer: Medicare Other | Admitting: Hematology

## 2019-11-30 ENCOUNTER — Inpatient Hospital Stay (HOSPITAL_COMMUNITY): Payer: Medicare Other

## 2019-11-30 ENCOUNTER — Encounter (HOSPITAL_COMMUNITY): Payer: Self-pay | Admitting: Internal Medicine

## 2019-11-30 DIAGNOSIS — Z515 Encounter for palliative care: Secondary | ICD-10-CM

## 2019-11-30 DIAGNOSIS — Z7189 Other specified counseling: Secondary | ICD-10-CM

## 2019-11-30 MED ORDER — SODIUM CHLORIDE 0.9 % IV SOLN
1.0000 g | INTRAVENOUS | Status: DC
  Administered 2019-11-30: 1 g via INTRAVENOUS
  Filled 2019-11-30 (×2): qty 10

## 2019-11-30 MED ORDER — CHLORHEXIDINE GLUCONATE CLOTH 2 % EX PADS
6.0000 | MEDICATED_PAD | Freq: Every day | CUTANEOUS | Status: DC
  Administered 2019-11-30 – 2019-12-01 (×2): 6 via TOPICAL

## 2019-11-30 NOTE — Consult Note (Signed)
Consultation Note Date: 11/30/2019   Patient Name: Rose Lutz  DOB: Jan 25, 1950  MRN: [id number]  Age / Sex: 69 y.o., female  PCP: Jake Samples, PA-C Referring Physician: Murlean Iba, MD  Reason for Consultation: Establishing goals of care and Psychosocial/spiritual support  HPI/Patient Profile: 69 y.o. female  with past medical history of HTN, RA, OA, mets lung cancer - brain/liver. declining health- full assist/ ADLs admitted on 11/29/2019 with UTI/AKI.   Clinical Assessment and Goals of Care: I have reviewed medical records including EPIC notes, labs and imaging, examined the patient and met at bedside with patient to discuss diagnosis prognosis, GOC, EOL wishes, disposition and options.  Mrs. Rabalais is sitting up quietly in bed.  She greets me making and somewhat keeping eye contact.  She appears chronically ill and quite frail, older than stated age.  She is oriented x3, but is slowed with her responses to my orientation questions.  She is able to make her basic needs known.  There is no family at bedside at this time. I introduced Palliative Medicine as specialized medical care for people living with serious illness. It focuses on providing relief from the symptoms and stress of a serious illness.   We discussed a brief life review of the patient.  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  She has been married to [place] for 27 years this year.  She had 3 children, one still living, daughter, Michiel Cowboy who lives in [place].  Mrs. Ysaguirre and her husband moved to [place] a few months ago.  As far as functional and nutritional status, Mrs. Mcbrearty denies issues or concerns, but per notes, she needs assistance with ADLs.  We discussed her current illness and what it means in the larger context of her on-going co-morbidities.  Natural disease trajectory  and expectations at EOL were discussed.  I share that I read that she wants to return to [place] as she is nearing end-of-life.  She states that this is correct.  I ask when she feels like would be the right time to move back and she states probably after the first of the year.  We talked about her functional decline and her abilities.  I encouraged her to move back to [place] if this is what she so desires sooner rather than later so that she may enjoy being with friends and family.  As we continue to discuss her choices, and I mention her daughter Claiborne Billings, she shares it Claiborne Billings is "bipolar" and that she cannot live with Claiborne Billings.  I ask if she would live with, and she tells me that she would find a friend or get her own apartment.  I do not believe that she could live independently.   We talked about the treatment plan, no questions or concerns at this time.  Call to husband of 43 years, Jalah Warmuth.  I asked Mr. Tukes what he understands about Sherry's illness.  He tells me that he knows that she  has a UTI that she is being treated for.  He tells me a story of decline over the last 3 weeks in particular.  He shares that she is "hallucinating more".    We talk about her desire to go back to [place], but Thayer Jew states that they will probably not be able to return.  He tells me that Mrs. Kent is unable to live with her daughter Claiborne Billings, but that Claiborne Billings wants residential hospice back in [place] when time is right.  We talked about the logistics of going to [place], my worry that if she were to qualify for residential hospice, the trip to [place] would be too difficult.  We talk about HCPOA see below.    We talk about STR, but Thayer Jew thinks she wouldn't go.  He shares that her physical function has decreased over last 3 weeks, but hopes she may be able to improve after treatment.  I share my worry that this is a sign of decline related to cancer.  Cheron Schaumann tells me that PT at home did help with Sherry's  mobility.   Out patient Palliative Care services were requested by Oncology services.   Questions and concerns were addressed.  The family was encouraged to call with questions or concerns.   Conference with attending and [facility] team related to patient condition, needs and GOC.     HCPOA    OTHER -Mrs. Cottrill tells me that her brother, Estell Harpin is her healthcare surrogate.  She tells me that she completed paperwork naming her brother Kyung Rudd as her surrogate about 7 to 8 years ago.   Mrs. Gasparini is married to her husband, Ada Holness for approximately 27 years.  She bore 3 children, has 1 living child, daughter Michiel Cowboy who lives in [place]. Mr. Kamphuis tells me that he was not aware that Mrs. Gural has named anyone other than he is her healthcare surrogate.    SUMMARY OF RECOMMENDATIONS   Continue to treat the treatable but no CPR or intubation. Continue chemotherapy treatments as offered. Oncology has made outpatient palliative referral. Could possibly benefit from short-term rehab    Code Status/Advance Care Planning:  DNR  Symptom Management:   Per hospitalist, no additional needs at this time.  Palliative Prophylaxis:   Frequent Pain Assessment and Oral Care  Additional Recommendations (Limitations, Scope, Preferences):  Treat the treatable but no extraordinary measures.  Open to continuing chemo if offered.  Psycho-social/Spiritual:   Desire for further Chaplaincy support:no  Additional Recommendations: Caregiving  Support/Resources and Education on Hospice  Prognosis:   Unable to determine, based on outcomes.  3 to 6 months and has a baseline decreasing functional status.  Discharge Planning: Determined, based on outcomes and patient/family choice      Primary Diagnoses: Present on Admission: . HTN (hypertension) . Nonsquamous nonsmall cell neoplasm of left lung ([place]) . Altered mental status, unspecified . Acute lower UTI . UTI (urinary tract  infection)   I have reviewed the medical record, interviewed the patient and family, and examined the patient. The following aspects are pertinent.  Past Medical History:  Diagnosis Date  . Chronic pain   . Depression   . Essential hypertension   . Family history of breast cancer   . Family history of colon cancer   . Family history of pancreatic cancer   . GERD (gastroesophageal reflux disease)   . History of lung cancer 06/2017  . Osteoarthritis   . Pulmonary asbestosis ([place])   . Rheumatoid arthritis ([place])  Social History   Socioeconomic History  . Marital status: Married    Spouse name: Not on file  . Number of children: 3  . Years of education: Not on file  . Highest education level: Not on file  Occupational History  . Occupation: DISABLED  Tobacco Use  . Smoking status: Former Smoker    Packs/day: 1.25    Years: 38.00    Pack years: 47.50    Types: Cigarettes    Start date: 01/21/1968    Quit date: 01/20/2006    Years since quitting: 13.8  . Smokeless tobacco: Never Used  Vaping Use  . Vaping Use: Never used  Substance and Sexual Activity  . Alcohol use: No    Alcohol/week: 0.0 standard drinks  . Drug use: No  . Sexual activity: Not Currently  Other Topics Concern  . Not on file  Social History Narrative  . Not on file   Social Determinants of Health   Financial Resource Strain: Medium Risk  . Difficulty of Paying Living Expenses: Somewhat hard  Food Insecurity: No Food Insecurity  . Worried About Charity fundraiser in the Last Year: Never true  . Ran Out of Food in the Last Year: Never true  Transportation Needs: No Transportation Needs  . Lack of Transportation (Medical): No  . Lack of Transportation (Non-Medical): No  Physical Activity: Inactive  . Days of Exercise per Week: 0 days  . Minutes of Exercise per Session: 0 min  Stress: Stress Concern Present  . Feeling of Stress : To some extent  Social Connections: Moderately Isolated  .  Frequency of Communication with Friends and Family: Twice a week  . Frequency of Social Gatherings with Friends and Family: Never  . Attends Religious Services: 1 to 4 times per year  . Active Member of Clubs or Organizations: No  . Attends Archivist Meetings: Never  . Marital Status: Married   Family History  Problem Relation Age of Onset  . Breast cancer Mother 2       d. 77  . Colon cancer Maternal Grandmother 38       d. 49  . Pancreatic cancer Son 69  . Healthy Father   . Arthritis Sister   . Arthritis Paternal Grandmother   . Other Son 36       MVA  . Esophageal cancer Maternal Aunt    Scheduled Meds: . amitriptyline  100 mg Oral QHS  . Chlorhexidine Gluconate Cloth  6 each Topical Daily  . enoxaparin (LOVENOX) injection  40 mg Subcutaneous Q24H  . folic acid  1 mg Oral Daily  . gabapentin  100 mg Oral TID  . hydrALAZINE  25 mg Oral Daily  . hydrochlorothiazide  12.5 mg Oral Daily  . hydroxychloroquine  400 mg Oral Q MTWThF  . leflunomide  20 mg Oral Daily  . loratadine  10 mg Oral Daily  . losartan  50 mg Oral Daily  . pantoprazole  40 mg Oral Daily  . potassium chloride  20 mEq Oral Daily  . senna  1 tablet Oral QHS   Continuous Infusions: . sodium chloride 50 mL/hr at 11/29/19 2339   PRN Meds:.acetaminophen **OR** acetaminophen, diphenhydrAMINE, lactulose, ondansetron, oxyCODONE, prochlorperazine Medications Prior to Admission:  Prior to Admission medications   Medication Sig Start Date End Date Taking? Authorizing Provider  alendronate (FOSAMAX) 35 MG tablet Take 35 mg by mouth every 7 (seven) days.  03/25/19  Yes [provider]  amitriptyline (ELAVIL)  100 MG tablet Take 100 mg by mouth at bedtime.   Yes [provider]  Ascorbic Acid (VITAMIN C) 250 MG CHEW Chew 250 mg by mouth daily.  11/13/16  Yes [provider]  cetirizine (ZYRTEC) 10 MG tablet Take 10 mg by mouth daily as needed for allergies.    Yes [provider]  diphenhydrAMINE (BENADRYL) 25 mg capsule Take 25 mg by mouth every 6 (six) hours as needed for itching or allergies.    Yes [provider]  folic acid (FOLVITE) 1 MG tablet Take 1 tablet (1 mg total) by mouth daily. 03/24/17 11/29/19 Yes Deveshwar, Abel Presto, MD  gabapentin (NEURONTIN) 100 MG capsule Take 100 mg by mouth 3 (three) times daily.  09/11/17  Yes [provider]  hydrALAZINE (APRESOLINE) 25 MG tablet Take 25 mg by mouth daily.  03/25/19  Yes [provider]  hydroxychloroquine (PLAQUENIL) 200 MG tablet TAKE 2 TABLETS BY MOUTH  EVERY EVENING. [place] Patient taking differently: Take 400 mg by mouth every Monday, Tuesday, Wednesday, Thursday, and Friday.  09/17/18  Yes Deveshwar, Abel Presto, MD  leflunomide (ARAVA) 10 MG tablet TAKE 2 TABLETS BY MOUTH  DAILY Patient taking differently: Take 20 mg by mouth daily.  09/24/18  Yes Deveshwar, Abel Presto, MD  losartan-hydrochlorothiazide (HYZAAR) 50-12.5 MG tablet Take 1 tablet by mouth daily. 06/29/19  Yes [provider]  [place]. Devices MISC Please provide patient with oxygen via nasal cannula at 2 liters per minute continuous.  Please provide tubing for oxygen delivery. 10/20/19  Yes Derek Jack, MD  Multiple Vitamin (MULTIVITAMIN WITH MINERALS) TABS tablet Take 1 tablet by mouth daily.   Yes [provider]  omeprazole (PRILOSEC) 20 MG capsule Take 20 mg by mouth daily.   Yes [provider]  ondansetron (ZOFRAN-ODT) 4 MG disintegrating tablet Take 4 mg by mouth every 8 (eight) hours as needed for nausea or vomiting.  09/29/18  Yes [provider]  Oxycodone HCl 10 MG TABS Take 10 mg by mouth 4 (four) times daily as needed. 10/07/19  Yes [provider]  potassium chloride (MICRO-K) 10 MEQ CR capsule Take 10 mEq by mouth 2 (two) times daily.  05/19/19  Yes [provider]  prochlorperazine (COMPAZINE) 10 MG tablet Take 10 mg by mouth every 6 (six)  hours as needed for nausea or vomiting.  06/12/19  Yes [provider]  senna (SENOKOT) 8.6 MG tablet Take 1 tablet by mouth at bedtime.  03/25/19  Yes [provider]  Vitamin D, Ergocalciferol, (DRISDOL) 50000 units CAPS capsule Take 1 capsule (50,000 Units total) by mouth every 7 (seven) days. 12/07/15  Yes Panwala, Naitik, PA-C  dexamethasone (DECADRON) 4 MG tablet Take by mouth. Patient not taking: Reported on 11/29/2019 06/06/19   [provider]  Lactulose 20 GM/30ML SOLN Take 30 mLs (20 g total) by mouth at bedtime as needed. Patient not taking: Reported on 11/29/2019 09/27/19   Derek Jack, MD   Allergies  Allergen Reactions  . Quinine Derivatives Nausea And Vomiting  . Sulfa Antibiotics Nausea And Vomiting  . Barium-Containing Compounds   . Percocet [Oxycodone-Acetaminophen] Other (See Comments)    Headahces   Review of Systems  Unable to perform ROS: Mental status change    Physical Exam Vitals and nursing note reviewed.  Constitutional:      General: She is not in acute distress.    Appearance: She is ill-appearing.  HENT:     Head: Normocephalic and atraumatic.  Mouth/Throat:     Mouth: Mucous membranes are moist.  Cardiovascular:     Rate and Rhythm: Normal rate.  Pulmonary:     Effort: Pulmonary effort is normal. No respiratory distress.  Skin:    General: Skin is warm and dry.  Neurological:     Mental Status: She is alert and oriented to person, place, and time.  Psychiatric:        Mood and Affect: Mood normal.        Behavior: Behavior normal.     Vital Signs: BP 119/72 (BP Location: Right Arm)   Pulse (!) 110   Temp 98.2 F (36.8 C) (Oral)   Resp 18   Wt 76 kg   SpO2 97%   BMI 29.68 kg/m  Pain Scale: 0-10   Pain Score: 0-No pain   SpO2: SpO2: 97 % O2 Device:SpO2: 97 % O2 Flow Rate: .   IO: Intake/output summary:   Intake/Output Summary (Last 24 hours) at 11/30/2019 1346 Last data filed at 11/30/2019  1200 Gross per 24 hour  Intake 1316.58 ml  Output 400 ml  Net 916.58 ml    LBM: Last BM Date: 11/29/19 Baseline Weight: Weight: 76 kg Most recent weight: Weight: 76 kg     Palliative Assessment/Data:   Flowsheet Rows     Most Recent Value  Intake Tab  Referral Department Hospitalist  Unit at Time of Referral Med/Surg Unit  Palliative Care Primary Diagnosis Other (Comment)  Date Notified 11/29/19  Palliative Care Type New Palliative care  Reason for referral Clarify Goals of Care  Date of Admission 11/29/19  Date first seen by Palliative Care 11/30/19  # of days Palliative referral response time 1 Day(s)  # of days IP prior to Palliative referral 0  Clinical Assessment  Palliative Performance Scale Score 30%  Pain Max last 24 hours Not able to report  Pain Min Last 24 hours Not able to report  Dyspnea Max Last 24 Hours Not able to report  Dyspnea Min Last 24 hours Not able to report  Psychosocial & Spiritual Assessment  Palliative Care Outcomes      Time In: 0950 Time Out: 1040 Time Total: 50 minutes  Greater than 50%  of this time was spent counseling and coordinating care related to the above assessment and plan.  Signed by: Drue Novel, NP   Please contact Palliative Medicine Team phone at [phone] for questions and concerns.  For individual provider: See Shea Evans

## 2019-11-30 NOTE — Progress Notes (Signed)
PROGRESS NOTE   Rose Lutz  YBO:[id number] DOB: Apr 05, 1950 DOA: 11/29/2019 PCP: Jake Samples, PA-C   Chief Complaint  Patient presents with  . Failure To Thrive    Brief Admission History:  69 y.o. 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 She has had declining health and requires full assistance with ADLs. Her appetite is poor. She is frequently confused and disoriented. She has had multiple falls during episodes of confusion.  Over the past 48 hours her condition has become acutely worse. She is brought by EMS to [facility] for evaluation.  She was admitted with findings of acute UTI.   Assessment & Plan:   Active Problems:   HTN (hypertension)   Nonsquamous nonsmall cell neoplasm of left lung (HCC)   Altered mental status, unspecified   Acute lower UTI   UTI (urinary tract infection)   Palliative care by specialist   Encounter for hospice care discussion  1. Metabolic encephalopathy - improving with treatments.  Continue supportive measures. 2. UTI - continue iv ceftriaxone pending urine culture findings.  Continue supportive measures.  3. nonsmall cell metastatic lung cancer - oncology has ordered for outpatient palliative care which is appropriate.   4. DNR present on admission - continue DNR in hospital.  5. Sinus tachycardia - secondary to dehdyration and metastatic cancer, continue IV fluid hydration and follow.    DVT prophylaxis: enoxaparin  Code Status: DNR  Family Communication: husband  Disposition: Home with HH  Status is: Inpatient  Remains inpatient appropriate because:IV treatments appropriate due to intensity of illness or inability to take PO and Inpatient level of care appropriate due to severity of illness  Dispo:  Patient From: Home  Planned Disposition: To be determined  Expected discharge date: 12/01/19  Medically stable for discharge: No  Consultants:   Palliative care    Procedures:     Antimicrobials:  Ceftriaxone 11/9>>   Subjective: Pt is less confused today.    Objective: Vitals:   11/29/19 2312 11/30/19 0343 11/30/19 0643 11/30/19 1015  BP: 139/80 120/69 127/65 119/72  Pulse: (!) 110 (!) 115 (!) 111 (!) 110  Resp: 16 20 20 18   Temp: 98.8 F (37.1 C) 99.2 F (37.3 C) 98.1 F (36.7 C) 98.2 F (36.8 C)  TempSrc: Oral Oral Oral Oral  SpO2: 99% 96% 95% 97%  Weight: 76 kg       Intake/Output Summary (Last 24 hours) at 11/30/2019 1454 Last data filed at 11/30/2019 1200 Gross per 24 hour  Intake 1316.58 ml  Output 400 ml  Net 916.58 ml   Filed Weights   11/29/19 2312  Weight: 76 kg    Examination:  General exam: Appears calm and comfortable  Respiratory system: Clear to auscultation. Respiratory effort normal. Cardiovascular system: S1 & S2 heard, tachycardic rate. No JVD, murmurs, rubs, gallops or clicks. No pedal edema. Gastrointestinal system: Abdomen is nondistended, soft and nontender. No organomegaly or masses felt. Normal bowel sounds heard. Central nervous system: Alert and oriented to person and place. No focal neurological deficits. Extremities: Symmetric 5 x 5 power. Skin: No rashes, lesions or ulcers Psychiatry: Judgement and insight appear normal. Mood & affect appropriate.   Data Reviewed: I have personally reviewed following labs and imaging studies  CBC: Recent Labs  Lab 11/29/19 1631  WBC 8.0  NEUTROABS 5.2  HGB 8.9*  HCT 29.1*  MCV 109.4*  PLT 277    Basic Metabolic Panel: Recent Labs  Lab 11/29/19 1631  NA 138  K 3.7  CL 105  CO2 22  GLUCOSE 100*  BUN 17  CREATININE 1.28*  CALCIUM 8.8*    GFR: Estimated Creatinine Clearance: 39.9 mL/min (A) (by C-G formula based on SCr of 1.28 mg/dL (H)).  Liver Function Tests: Recent Labs  Lab 11/29/19 1631  AST 42*  ALT 25  ALKPHOS 98  BILITOT 0.7  PROT 7.2  ALBUMIN 3.0*    CBG: Recent Labs  Lab 11/29/19 1555  GLUCAP 82    Recent  Results (from the past 240 hour(s))  Respiratory Panel by RT PCR (Flu A&B, Covid) - Nasopharyngeal Swab     Status: None   Collection Time: 11/29/19  4:42 PM   Specimen: Nasopharyngeal Swab  Result Value Ref Range Status   SARS Coronavirus 2 by RT PCR NEGATIVE NEGATIVE Final    Comment: (NOTE) SARS-CoV-2 target nucleic acids are NOT DETECTED.  The SARS-CoV-2 RNA is generally detectable in upper respiratoy specimens during the acute phase of infection. The lowest concentration of SARS-CoV-2 viral copies this assay can detect is 131 copies/mL. A negative result does not preclude SARS-Cov-2 infection and should not be used as the sole basis for treatment or other patient management decisions. A negative result may occur with  improper specimen collection/handling, submission of specimen other than nasopharyngeal swab, presence of viral mutation(s) within the areas targeted by this assay, and inadequate number of viral copies (<131 copies/mL). A negative result must be combined with clinical observations, patient history, and epidemiological information. The expected result is Negative.  Fact Sheet for Patients:  PinkCheek.be  Fact Sheet for Healthcare Providers:  GravelBags.it  This test is no t yet approved or cleared by the [place] FDA and  has been authorized for detection and/or diagnosis of SARS-CoV-2 by FDA under an Emergency Use Authorization (EUA). This EUA will remain  in effect (meaning this test can be used) for the duration of the COVID-19 declaration under Section 564(b)(1) of the Act, 21 U.S.C. section 360bbb-3(b)(1), unless the authorization is terminated or revoked sooner.     Influenza A by PCR NEGATIVE NEGATIVE Final   Influenza B by PCR NEGATIVE NEGATIVE Final    Comment: (NOTE) The Xpert Xpress SARS-CoV-2/FLU/RSV assay is intended as an aid in  the diagnosis of influenza from Nasopharyngeal swab  specimens and  should not be used as a sole basis for treatment. Nasal washings and  aspirates are unacceptable for Xpert Xpress SARS-CoV-2/FLU/RSV  testing.  Fact Sheet for Patients: PinkCheek.be  Fact Sheet for Healthcare Providers: GravelBags.it  This test is not yet approved or cleared by the [place] FDA and  has been authorized for detection and/or diagnosis of SARS-CoV-2 by  FDA under an Emergency Use Authorization (EUA). This EUA will remain  in effect (meaning this test can be used) for the duration of the  Covid-19 declaration under Section 564(b)(1) of the Act, 21  U.S.C. section 360bbb-3(b)(1), unless the authorization is  terminated or revoked. Performed at [facility], [address] [place], [place] [address]     Radiology Studies: CT Head Wo Contrast  Result Date: 11/29/2019 CLINICAL DATA:  Mental status change. EXAM: CT HEAD WITHOUT CONTRAST TECHNIQUE: Contiguous axial images were obtained from the base of the skull through the vertex without intravenous contrast. COMPARISON:  MRI 10/11/2019. FINDINGS: Brain: When comparing across modalities, there appears to be slightly increased edema associated with known metastatic lesion in the medial left occipital lobe, which may represent progressive  disease. No substantial mass effect. Grossly similar vasogenic edema associated with additional known metastatic lesions, with lesions better characterized on prior MRI. No evidence of acute large vascular territory infarct. No acute hemorrhage. No hydrocephalus. Vascular: Calcific atherosclerosis. Skull: No acute fracture or abnormal bone lesions identified. Sinuses/Orbits: No acute findings. Other: No mastoid effusions. IMPRESSION: 1. When comparing across modalities, there appears to be slightly increased edema associated with known metastatic lesion in the medial left occipital lobe. No substantial mass effect. MRI with  and without contrast could further evaluate if clinically indicated. 2. Otherwise, grossly similar vasogenic edema associated with additional metastatic lesions which were better characterized on prior MRI. Electronically Signed   By: Margaretha Sheffield MD   On: 11/29/2019 18:04   DG Chest Port 1 View  Result Date: 11/29/2019 CLINICAL DATA:  Dyspnea EXAM: PORTABLE CHEST 1 VIEW COMPARISON:  09/05/2019 FINDINGS: Surgical changes of left upper lobectomy with associated left-sided volume loss is again noted. Opacity at the medial left apex likely represents vascular shadow and/or pleural thickening. Stable scarring at the left lung base. Right lung is clear. No pneumothorax or pleural effusion. Right subclavian chest port tip is unchanged at the expected confluence of the superior vena cava. Cardiac size within normal limits. Advanced degenerative changes are seen within the shoulders. No acute bone abnormality. IMPRESSION: No active disease. Electronically Signed   By: Fidela Salisbury MD   On: 11/29/2019 16:58   Scheduled Meds: . amitriptyline  100 mg Oral QHS  . Chlorhexidine Gluconate Cloth  6 each Topical Daily  . enoxaparin (LOVENOX) injection  40 mg Subcutaneous Q24H  . folic acid  1 mg Oral Daily  . gabapentin  100 mg Oral TID  . hydrALAZINE  25 mg Oral Daily  . hydrochlorothiazide  12.5 mg Oral Daily  . hydroxychloroquine  400 mg Oral Q MTWThF  . leflunomide  20 mg Oral Daily  . loratadine  10 mg Oral Daily  . losartan  50 mg Oral Daily  . pantoprazole  40 mg Oral Daily  . potassium chloride  20 mEq Oral Daily  . senna  1 tablet Oral QHS   Continuous Infusions: . sodium chloride 50 mL/hr at 11/29/19 2339     LOS: 1 day   Time spent: 35 minutes   Quinlynn Cuthbert Wynetta Emery, MD How to contact the [facility] Attending or Consulting provider [place] or covering provider during after hours [place], for this patient?  1. Check the care team in [facility] and look for a) attending/consulting [facility] provider listed  and b) the [facility] team listed 2. Log into [email or web address] and use [place]'s universal password to access. If you do not have the password, please contact the hospital operator. 3. Locate the [facility] provider you are looking for under [facility] and page to a number that you can be directly reached. 4. If you still have difficulty reaching the provider, please page the [facility] (Director on Call) for the Hospitalists listed on amion for assistance.  11/30/2019, 2:54 PM

## 2019-12-01 DIAGNOSIS — N390 Urinary tract infection, site not specified: Secondary | ICD-10-CM

## 2019-12-01 DIAGNOSIS — N3000 Acute cystitis without hematuria: Secondary | ICD-10-CM

## 2019-12-01 LAB — MAGNESIUM: Magnesium: 1.3 mg/dL — ABNORMAL LOW (ref 1.7–2.4)

## 2019-12-01 LAB — COMPREHENSIVE METABOLIC PANEL
ALT: 20 U/L (ref 0–44)
AST: 39 U/L (ref 15–41)
Albumin: 2.6 g/dL — ABNORMAL LOW (ref 3.5–5.0)
Alkaline Phosphatase: 84 U/L (ref 38–126)
Anion gap: 10 (ref 5–15)
BUN: 13 mg/dL (ref 8–23)
CO2: 22 mmol/L (ref 22–32)
Calcium: 8.5 mg/dL — ABNORMAL LOW (ref 8.9–10.3)
Chloride: 105 mmol/L (ref 98–111)
Creatinine, Ser: 1.22 mg/dL — ABNORMAL HIGH (ref 0.44–1.00)
GFR, Estimated: 48 mL/min — ABNORMAL LOW (ref >60)
Glucose, Bld: 95 mg/dL (ref 70–99)
Potassium: 3.7 mmol/L (ref 3.5–5.1)
Sodium: 137 mmol/L (ref 135–145)
Total Bilirubin: 0.5 mg/dL (ref 0.3–1.2)
Total Protein: 6.4 g/dL — ABNORMAL LOW (ref 6.5–8.1)

## 2019-12-01 LAB — CBC
HCT: 28.2 % — ABNORMAL LOW (ref 36.0–46.0)
Hemoglobin: 8.6 g/dL — ABNORMAL LOW (ref 12.0–15.0)
MCH: 33.3 pg (ref 26.0–34.0)
MCHC: 30.5 g/dL (ref 30.0–36.0)
MCV: 109.3 fL — ABNORMAL HIGH (ref 80.0–100.0)
Platelets: 277 10*3/uL (ref 150–400)
RBC: 2.58 MIL/uL — ABNORMAL LOW (ref 3.87–5.11)
RDW: 17.3 % — ABNORMAL HIGH (ref 11.5–15.5)
WBC: 8.2 10*3/uL (ref 4.0–10.5)
nRBC: 0 % (ref 0.0–0.2)

## 2019-12-01 MED ORDER — LEFLUNOMIDE 10 MG PO TABS
20.0000 mg | ORAL_TABLET | Freq: Every day | ORAL | Status: DC
Start: 2019-12-01 — End: 2020-02-22

## 2019-12-01 MED ORDER — MAGNESIUM SULFATE 4 GM/100ML IV SOLN
4.0000 g | Freq: Once | INTRAVENOUS | Status: AC
  Administered 2019-12-01: 4 g via INTRAVENOUS
  Filled 2019-12-01: qty 100

## 2019-12-01 MED ORDER — CEFDINIR 300 MG PO CAPS: 600.0000 mg | ORAL_CAPSULE | Freq: Every day | ORAL | 0 refills | Status: DC

## 2019-12-01 MED ORDER — HYDROXYCHLOROQUINE SULFATE 200 MG PO TABS: 400.0000 mg | ORAL_TABLET | ORAL | Status: DC

## 2019-12-01 MED ORDER — HEPARIN SOD (PORK) LOCK FLUSH 100 UNIT/ML IV SOLN
500.0000 [IU] | Freq: Once | INTRAVENOUS | Status: AC
  Administered 2019-12-01: 500 [IU] via INTRAVENOUS
  Filled 2019-12-01: qty 5

## 2019-12-01 MED ORDER — CEFDINIR 300 MG PO CAPS: 600.0000 mg | ORAL_CAPSULE | Freq: Every day | ORAL | 0 refills | Status: AC

## 2019-12-01 NOTE — Discharge Summary (Addendum)
Physician Discharge Summary  ANANYA MCCLEESE URK:[id number] DOB: [birth year] DOA: 11/29/2019  PCP: Jake Samples, PA-C Oncologist: Dr Delton Coombes Rheumatologist: Dr. Bo Merino Admit date: 11/29/2019 Discharge date: 12/01/2019  Admitted From:  Home  Disposition: Home with [facility] ([facility])  Recommendations for Outpatient Follow-up:  Follow up with PCP in 1 weeks Follow up with oncologist in 5 days Follow up with rheumatologist  Outpatient palliative care referral recommended  Home Health: PT, OT, RN, Aide, SW  Discharge Condition: STABLE BUT GUARDED  CODE STATUS: DNR    Brief Hospitalization Summary: Please see all hospital notes, images, labs for full details of the hospitalization. Brief Admission History:  69 y.o. 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 She has had declining health and requires full assistance with ADLs. Her appetite is poor. She is frequently confused and disoriented. She has had multiple falls during episodes of confusion.  Over the past 48 hours her condition has become acutely worse. She is brought by EMS to [facility] for evaluation.  She was admitted with findings of acute UTI.    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  Likely was exacerbated by UTI and dehydration.  UTI - Treated with iv ceftriaxone and supportive measures.  DC home on 3 more days of oral cefdinir to complete course.  nonsmall cell metastatic lung cancer - oncology has ordered for outpatient palliative care which is appropriate.  Close follow up with oncologist.   DNR present on admission - continue DNR in hospital.   Sinus tachycardia - secondary to dehdyration and metastatic cancer, treated with IV fluid hydration and has improved.     Hypomagnesemia - IV replacement was given.   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 (HCC)   Altered mental status, unspecified   Acute lower UTI   UTI (urinary tract infection)   Palliative care by specialist   Encounter for hospice care discussion  Discharge Instructions: Discharge Instructions     Ambulatory referral to Hematology / Oncology   Complete by: As directed       Allergies as of 12/01/2019       Reactions   Quinine Derivatives Nausea And Vomiting   Sulfa Antibiotics Nausea And Vomiting   Barium-containing Compounds    Percocet [oxycodone-acetaminophen] Other (See Comments)   Headahces        Medication List     STOP taking these medications    dexamethasone 4 MG tablet Commonly known as: DECADRON   Lactulose 20 GM/30ML Soln       TAKE these medications    alendronate 35 MG tablet Commonly known as: FOSAMAX Take 35 mg by mouth every 7 (seven) days.   amitriptyline 100 MG tablet Commonly known as: ELAVIL Take 100 mg by mouth at bedtime.   cefdinir 300 MG capsule Commonly known as: OMNICEF Take 2 capsules (600 mg total) by mouth daily for 3 days. Start taking on:  December 02, 2019   cetirizine 10 MG tablet Commonly known as: ZYRTEC Take 10 mg by mouth daily as needed for allergies.   diphenhydrAMINE 25 mg capsule Commonly known as: BENADRYL Take 25 mg by mouth every 6 (six) hours as needed for itching or allergies.   folic acid 1 MG tablet Commonly known as: FOLVITE Take 1 tablet (1 mg total) by mouth daily.   gabapentin 100 MG capsule Commonly known as: NEURONTIN Take 100 mg by mouth 3  (three) times daily.   hydrALAZINE 25 MG tablet Commonly known as: APRESOLINE Take 25 mg by mouth daily.   hydroxychloroquine 200 MG tablet Commonly known as: PLAQUENIL Take 2 tablets (400 mg total) by mouth every Monday, Tuesday, Wednesday, Thursday, and Friday.   leflunomide 10 MG tablet Commonly known as: ARAVA Take 2 tablets (20 mg total) by mouth daily.   losartan-hydrochlorothiazide 50-12.5 MG tablet Commonly known as: HYZAAR Take 1 tablet by mouth daily.   Misc. Devices Misc Please provide patient with oxygen via nasal cannula at 2 liters per minute continuous.  Please provide tubing for oxygen delivery.   multivitamin with minerals Tabs tablet Take 1 tablet by mouth daily.   omeprazole 20 MG capsule Commonly known as: PRILOSEC Take 20 mg by mouth daily.   ondansetron 4 MG disintegrating tablet Commonly known as: ZOFRAN-ODT Take 4 mg by mouth every 8 (eight) hours as needed for nausea or vomiting.   Oxycodone HCl 10 MG Tabs Take 10 mg by mouth 4 (four) times daily as needed.   potassium chloride 10 MEQ CR capsule Commonly known as: MICRO-K Take 10 mEq by mouth 2 (two) times daily.   prochlorperazine 10 MG tablet Commonly known as: COMPAZINE Take 10 mg by mouth every 6 (six) hours as needed for nausea or vomiting.   senna 8.6 MG tablet Commonly known as: SENOKOT Take 1 tablet by mouth at bedtime.   Vitamin C 250 MG Chew Chew 250 mg by mouth daily.   Vitamin D (Ergocalciferol) 1.25 MG (50000 UNIT) Caps capsule Commonly known as: DRISDOL Take 1 capsule (50,000 Units total) by mouth every 7 (seven) days.        Follow-up Information     Derek Jack, MD. Schedule an appointment as soon as possible for a visit in 5 day(s).   Specialty: Hematology Why: Hospital Follow Up  Contact information: [address] [place] [address] [phone]         Jake Samples, PA-C Follow up in 1 week(s).   Specialty: Family Medicine Contact  information: [place] [address] [phone]                Allergies  Allergen Reactions   Quinine Derivatives Nausea And Vomiting   Sulfa Antibiotics Nausea And Vomiting   Barium-Containing Compounds    Percocet [Oxycodone-Acetaminophen] Other (See Comments)    Headahces   Allergies as of 12/01/2019       Reactions   Quinine Derivatives Nausea And Vomiting   Sulfa Antibiotics Nausea And Vomiting   Barium-containing Compounds    Percocet [oxycodone-acetaminophen] Other (See Comments)   Headahces        Medication List     STOP taking these medications    dexamethasone 4 MG tablet Commonly known as: DECADRON   Lactulose 20 GM/30ML Soln       TAKE these medications    alendronate 35 MG tablet Commonly known as: FOSAMAX Take 35 mg by mouth every 7 (seven) days.  amitriptyline 100 MG tablet Commonly known as: ELAVIL Take 100 mg by mouth at bedtime.   cefdinir 300 MG capsule Commonly known as: OMNICEF Take 2 capsules (600 mg total) by mouth daily for 3 days. Start taking on: December 02, 2019   cetirizine 10 MG tablet Commonly known as: ZYRTEC Take 10 mg by mouth daily as needed for allergies.   diphenhydrAMINE 25 mg capsule Commonly known as: BENADRYL Take 25 mg by mouth every 6 (six) hours as needed for itching or allergies.   folic acid 1 MG tablet Commonly known as: FOLVITE Take 1 tablet (1 mg total) by mouth daily.   gabapentin 100 MG capsule Commonly known as: NEURONTIN Take 100 mg by mouth 3 (three) times daily.   hydrALAZINE 25 MG tablet Commonly known as: APRESOLINE Take 25 mg by mouth daily.   hydroxychloroquine 200 MG tablet Commonly known as: PLAQUENIL Take 2 tablets (400 mg total) by mouth every Monday, Tuesday, Wednesday, Thursday, and Friday.   leflunomide 10 MG tablet Commonly known as: ARAVA Take 2 tablets (20 mg total) by mouth daily.   losartan-hydrochlorothiazide 50-12.5 MG  tablet Commonly known as: HYZAAR Take 1 tablet by mouth daily.   Misc. Devices Misc Please provide patient with oxygen via nasal cannula at 2 liters per minute continuous.  Please provide tubing for oxygen delivery.   multivitamin with minerals Tabs tablet Take 1 tablet by mouth daily.   omeprazole 20 MG capsule Commonly known as: PRILOSEC Take 20 mg by mouth daily.   ondansetron 4 MG disintegrating tablet Commonly known as: ZOFRAN-ODT Take 4 mg by mouth every 8 (eight) hours as needed for nausea or vomiting.   Oxycodone HCl 10 MG Tabs Take 10 mg by mouth 4 (four) times daily as needed.   potassium chloride 10 MEQ CR capsule Commonly known as: MICRO-K Take 10 mEq by mouth 2 (two) times daily.   prochlorperazine 10 MG tablet Commonly known as: COMPAZINE Take 10 mg by mouth every 6 (six) hours as needed for nausea or vomiting.   senna 8.6 MG tablet Commonly known as: SENOKOT Take 1 tablet by mouth at bedtime.   Vitamin C 250 MG Chew Chew 250 mg by mouth daily.   Vitamin D (Ergocalciferol) 1.25 MG (50000 UNIT) Caps capsule Commonly known as: DRISDOL Take 1 capsule (50,000 Units total) by mouth every 7 (seven) days.        Procedures/Studies: CT Head Wo Contrast  Result Date: 11/29/2019 CLINICAL DATA:  Mental status change. EXAM: CT HEAD WITHOUT CONTRAST TECHNIQUE: Contiguous axial images were obtained from the base of the skull through the vertex without intravenous contrast. COMPARISON:  MRI 10/11/2019. FINDINGS: Brain: When comparing across modalities, there appears to be slightly increased edema associated with known metastatic lesion in the medial left occipital lobe, which may represent progressive disease. No substantial mass effect. Grossly similar vasogenic edema associated with additional known metastatic lesions, with lesions better characterized on prior MRI. No evidence of acute large vascular territory infarct. No acute hemorrhage. No hydrocephalus. Vascular:  Calcific atherosclerosis. Skull: No acute fracture or abnormal bone lesions identified. Sinuses/Orbits: No acute findings. Other: No mastoid effusions. IMPRESSION: 1. When comparing across modalities, there appears to be slightly increased edema associated with known metastatic lesion in the medial left occipital lobe. No substantial mass effect. MRI with and without contrast could further evaluate if clinically indicated. 2. Otherwise, grossly similar vasogenic edema associated with additional metastatic lesions which were better characterized on prior MRI. Electronically Signed  By: Margaretha Sheffield MD   On: 11/29/2019 18:04   DG Chest Port 1 View  Result Date: 11/29/2019 CLINICAL DATA:  Dyspnea EXAM: PORTABLE CHEST 1 VIEW COMPARISON:  09/05/2019 FINDINGS: Surgical changes of left upper lobectomy with associated left-sided volume loss is again noted. Opacity at the medial left apex likely represents vascular shadow and/or pleural thickening. Stable scarring at the left lung base. Right lung is clear. No pneumothorax or pleural effusion. Right subclavian chest port tip is unchanged at the expected confluence of the superior vena cava. Cardiac size within normal limits. Advanced degenerative changes are seen within the shoulders. No acute bone abnormality. IMPRESSION: No active disease. Electronically Signed   By: Fidela Salisbury MD   On: 11/29/2019 16:58     Subjective: Pt reports that she feels ready to go home.  She has no complaints.    Discharge Exam: Vitals:   12/01/19 0015 12/01/19 0455  BP: 120/75 135/71  Pulse: 100 (!) 102  Resp:  14  Temp:  97.8 F (36.6 C)  SpO2:  96%   Vitals:   11/30/19 2010 11/30/19 2013 12/01/19 0015 12/01/19 0455  BP: 128/84  120/75 135/71  Pulse: (!) 107  100 (!) 102  Resp: 13   14  Temp: 98.9 F (37.2 C)   97.8 F (36.6 C)  TempSrc:      SpO2: 97% 95%  96%  Weight:       General: Pt is alert, awake, not in acute distress Cardiovascular:  tachycardic, normal S1/S2 +, no rubs, no gallops Respiratory: CTA bilaterally, no wheezing, no rhonchi Abdominal: Soft, NT, ND, bowel sounds + Extremities: no edema, no cyanosis   The results of significant diagnostics from this hospitalization (including imaging, microbiology, ancillary and laboratory) are listed below for reference.     Microbiology: Recent Results (from the past 240 hour(s))  Respiratory Panel by RT PCR (Flu A&B, Covid) - Nasopharyngeal Swab     Status: None   Collection Time: 11/29/19  4:42 PM   Specimen: Nasopharyngeal Swab  Result Value Ref Range Status   SARS Coronavirus 2 by RT PCR NEGATIVE NEGATIVE Final    Comment: (NOTE) SARS-CoV-2 target nucleic acids are NOT DETECTED.  The SARS-CoV-2 RNA is generally detectable in upper respiratoy specimens during the acute phase of infection. The lowest concentration of SARS-CoV-2 viral copies this assay can detect is 131 copies/mL. A negative result does not preclude SARS-Cov-2 infection and should not be used as the sole basis for treatment or other patient management decisions. A negative result may occur with  improper specimen collection/handling, submission of specimen other than nasopharyngeal swab, presence of viral mutation(s) within the areas targeted by this assay, and inadequate number of viral copies (<131 copies/mL). A negative result must be combined with clinical observations, patient history, and epidemiological information. The expected result is Negative.  Fact Sheet for Patients:  PinkCheek.be  Fact Sheet for Healthcare Providers:  GravelBags.it  This test is no t yet approved or cleared by the [place] FDA and  has been authorized for detection and/or diagnosis of SARS-CoV-2 by FDA under an Emergency Use Authorization (EUA). This EUA will remain  in effect (meaning this test can be used) for the duration of the COVID-19  declaration under Section 564(b)(1) of the Act, 21 U.S.C. section 360bbb-3(b)(1), unless the authorization is terminated or revoked sooner.     Influenza A by PCR NEGATIVE NEGATIVE Final   Influenza B by PCR NEGATIVE NEGATIVE Final  Comment: (NOTE) The Xpert Xpress SARS-CoV-2/FLU/RSV assay is intended as an aid in  the diagnosis of influenza from Nasopharyngeal swab specimens and  should not be used as a sole basis for treatment. Nasal washings and  aspirates are unacceptable for Xpert Xpress SARS-CoV-2/FLU/RSV  testing.  Fact Sheet for Patients: PinkCheek.be  Fact Sheet for Healthcare Providers: GravelBags.it  This test is not yet approved or cleared by the [place] FDA and  has been authorized for detection and/or diagnosis of SARS-CoV-2 by  FDA under an Emergency Use Authorization (EUA). This EUA will remain  in effect (meaning this test can be used) for the duration of the  Covid-19 declaration under Section 564(b)(1) of the Act, 21  U.S.C. section 360bbb-3(b)(1), unless the authorization is  terminated or revoked. Performed at [facility], [address] [place], [place] [address]      Labs: BNP (last 3 results) No results for input(s): BNP in the last 8760 hours. Basic Metabolic Panel: Recent Labs  Lab 11/29/19 1631 12/01/19 0436  NA 138 137  K 3.7 3.7  CL 105 105  CO2 22 22  GLUCOSE 100* 95  BUN 17 13  CREATININE 1.28* 1.22*  CALCIUM 8.8* 8.5*  MG  --  1.3*   Liver Function Tests: Recent Labs  Lab 11/29/19 1631 12/01/19 0436  AST 42* 39  ALT 25 20  ALKPHOS 98 84  BILITOT 0.7 0.5  PROT 7.2 6.4*  ALBUMIN 3.0* 2.6*   No results for input(s): LIPASE, AMYLASE in the last 168 hours. No results for input(s): AMMONIA in the last 168 hours. CBC: Recent Labs  Lab 11/29/19 1631 12/01/19 0436  WBC 8.0 8.2  NEUTROABS 5.2  --   HGB 8.9* 8.6*  HCT 29.1* 28.2*  MCV 109.4* 109.3*  PLT 278  277   Cardiac Enzymes: No results for input(s): CKTOTAL, CKMB, CKMBINDEX, TROPONINI in the last 168 hours. BNP: Invalid input(s): POCBNP CBG: Recent Labs  Lab 11/29/19 1555  GLUCAP 82   D-Dimer No results for input(s): DDIMER in the last 72 hours. Hgb A1c No results for input(s): HGBA1C in the last 72 hours. Lipid Profile No results for input(s): CHOL, HDL, LDLCALC, TRIG, CHOLHDL, LDLDIRECT in the last 72 hours. Thyroid function studies No results for input(s): TSH, T4TOTAL, T3FREE, THYROIDAB in the last 72 hours.  Invalid input(s): FREET3 Anemia work up No results for input(s): VITAMINB12, FOLATE, FERRITIN, TIBC, IRON, RETICCTPCT in the last 72 hours. Urinalysis    Component Value Date/Time   COLORURINE YELLOW 11/29/2019 1611   APPEARANCEUR CLOUDY (A) 11/29/2019 1611   LABSPEC 1.015 11/29/2019 1611   PHURINE 5.0 11/29/2019 1611   GLUCOSEU NEGATIVE 11/29/2019 1611   HGBUR SMALL (A) 11/29/2019 1611   BILIRUBINUR NEGATIVE 11/29/2019 1611   KETONESUR 5 (A) 11/29/2019 1611   PROTEINUR 100 (A) 11/29/2019 1611   NITRITE NEGATIVE 11/29/2019 1611   LEUKOCYTESUR LARGE (A) 11/29/2019 1611   Sepsis Labs Invalid input(s): PROCALCITONIN,  WBC,  LACTICIDVEN Microbiology Recent Results (from the past 240 hour(s))  Respiratory Panel by RT PCR (Flu A&B, Covid) - Nasopharyngeal Swab     Status: None   Collection Time: 11/29/19  4:42 PM   Specimen: Nasopharyngeal Swab  Result Value Ref Range Status   SARS Coronavirus 2 by RT PCR NEGATIVE NEGATIVE Final    Comment: (NOTE) SARS-CoV-2 target nucleic acids are NOT DETECTED.  The SARS-CoV-2 RNA is generally detectable in upper respiratoy specimens during the acute phase of infection. The lowest concentration of SARS-CoV-2  viral copies this assay can detect is 131 copies/mL. A negative result does not preclude SARS-Cov-2 infection and should not be used as the sole basis for treatment or other patient management decisions. A negative  result may occur with  improper specimen collection/handling, submission of specimen other than nasopharyngeal swab, presence of viral mutation(s) within the areas targeted by this assay, and inadequate number of viral copies (<131 copies/mL). A negative result must be combined with clinical observations, patient history, and epidemiological information. The expected result is Negative.  Fact Sheet for Patients:  PinkCheek.be  Fact Sheet for Healthcare Providers:  GravelBags.it  This test is no t yet approved or cleared by the [place] FDA and  has been authorized for detection and/or diagnosis of SARS-CoV-2 by FDA under an Emergency Use Authorization (EUA). This EUA will remain  in effect (meaning this test can be used) for the duration of the COVID-19 declaration under Section 564(b)(1) of the Act, 21 U.S.C. section 360bbb-3(b)(1), unless the authorization is terminated or revoked sooner.     Influenza A by PCR NEGATIVE NEGATIVE Final   Influenza B by PCR NEGATIVE NEGATIVE Final    Comment: (NOTE) The Xpert Xpress SARS-CoV-2/FLU/RSV assay is intended as an aid in  the diagnosis of influenza from Nasopharyngeal swab specimens and  should not be used as a sole basis for treatment. Nasal washings and  aspirates are unacceptable for Xpert Xpress SARS-CoV-2/FLU/RSV  testing.  Fact Sheet for Patients: PinkCheek.be  Fact Sheet for Healthcare Providers: GravelBags.it  This test is not yet approved or cleared by the [place] FDA and  has been authorized for detection and/or diagnosis of SARS-CoV-2 by  FDA under an Emergency Use Authorization (EUA). This EUA will remain  in effect (meaning this test can be used) for the duration of the  Covid-19 declaration under Section 564(b)(1) of the Act, 21  U.S.C. section 360bbb-3(b)(1), unless the authorization is   terminated or revoked. Performed at [facility], [address] [place], [place] [address]    Time coordinating discharge: 40 mins   SIGNED:  Irwin Brakeman, MD  [facility] 12/01/2019, 12:03 PM How to contact the [facility] Attending or Consulting provider [place] or covering provider during after hours [place], for this patient?  Check the care team in [facility] and look for a) attending/consulting [facility] provider listed and b) the [facility] team listed Log into [email or web address] and use 's universal password to access. If you do not have the password, please contact the hospital operator. Locate the [facility] provider you are looking for under [facility] and page to a number that you can be directly reached. If you still have difficulty reaching the provider, please page the [facility] (Director on Call) for the Hospitalists listed on amion for assistance.

## 2019-12-01 NOTE — Discharge Instructions (Signed)
IMPORTANT INFORMATION: PAY CLOSE ATTENTION   PHYSICIAN DISCHARGE INSTRUCTIONS  Follow with Primary care provider  Jake Samples, PA-C  and other consultants as instructed by your Hospitalist Physician  [place] IF SYMPTOMS COME BACK, WORSEN OR NEW PROBLEM DEVELOPS   Please note: You were cared for by a hospitalist during your hospital stay. Every effort will be made to forward records to your primary care provider.  You can request that your primary care provider send for your hospital records if they have not received them.  Once you are discharged, your primary care physician will handle any further medical issues. Please note that NO REFILLS for any discharge medications will be authorized once you are discharged, as it is imperative that you return to your primary care physician (or establish a relationship with a primary care physician if you do not have one) for your post hospital discharge needs so that they can reassess your need for medications and monitor your lab values.  Please get a complete blood count and chemistry panel checked by your Primary MD at your next visit, and again as instructed by your Primary MD.  Get Medicines reviewed and adjusted: Please take all your medications with you for your next visit with your Primary MD  Laboratory/radiological data: Please request your Primary MD to go over all hospital tests and procedure/radiological results at the follow up, please ask your primary care provider to get all Hospital records sent to his/her office.  In some cases, they will be blood work, cultures and biopsy results pending at the time of your discharge. Please request that your primary care provider follow up on these results.  If you are diabetic, please bring your blood sugar readings with you to your follow up appointment with primary care.    Please call and make your follow up appointments as soon as possible.    Also  Note the following: If you experience worsening of your admission symptoms, develop shortness of breath, life threatening emergency, suicidal or homicidal thoughts you must seek medical attention immediately by calling 911 or calling your MD immediately  if symptoms less severe.  You must read complete instructions/literature along with all the possible adverse reactions/side effects for all the Medicines you take and that have been prescribed to you. Take any new Medicines after you have completely understood and accpet all the possible adverse reactions/side effects.   Do not drive when taking Pain medications or sleeping medications (Benzodiazepines)  Do not take more than prescribed Pain, Sleep and Anxiety Medications. It is not advisable to combine anxiety,sleep and pain medications without talking with your primary care practitioner  Special Instructions: If you have smoked or chewed Tobacco  in the last 2 yrs please stop smoking, stop any regular Alcohol  and or any Recreational drug use.  Wear Seat belts while driving.  Do not drive if taking any narcotic, mind altering or controlled substances or recreational drugs or alcohol.

## 2019-12-01 NOTE — TOC Transition Note (Signed)
Transition of Care [facility]) - CM/SW Discharge Note   Patient Details  Name: Rose Lutz MRN: [id number] Date of Birth: [birth year]  [facility] CM/SW Contact:  Ihor Gully, LCSW Phone Number: 12/01/2019, 4:25 PM   Clinical Narrative:    Patient is agreeable to [facility]. [facility] is only agency accepting patient's insurance. Oncologist previously made oppt palliative referral.    Final next level of care: [place] Barriers to Discharge: No Barriers Identified   Patient Goals and CMS Choice        Discharge Placement                       Discharge Plan and Services                          HH Arranged: OT, PT, Social Work [place] Date [place]: 12/01/19 Time [place]: 1625 Representative spoke with at [place]: [place] ([place]) Interventions     Readmission Risk Interventions No flowsheet data found.

## 2019-12-02 ENCOUNTER — Other Ambulatory Visit: Payer: Self-pay | Admitting: Family Medicine

## 2019-12-02 NOTE — Telephone Encounter (Signed)
12/02/2019 12:22 PM  I called [facility] regarding patient's call that discharge Rx was not sent.  They report that mail order pharmacy had already filled Rx and it would have to be cancelled before they could fill the Rx. I called and spoke with husband.  He was irate and confrontational and hung up the phone on me.  He would not allow me to speak further with him. Unfortunately would not answer the phone again. I tried to explain to him that Rx was sent to default pharmacy and that when RN asked me to send to [facility] we did that yesterday.  He says that had told the [facility] on admission about his pharmacy preference but for some reason it was never changed.  I notified patient experience and they will notify the pharmacy department about it as well.  It is our practice to send Rxs to primary pharmacy first unless they specifically request Rxs be sent to a different pharmacy.    Murvin Natal MD  How to contact the [facility] Attending or Consulting provider [place] or covering provider during after hours [place], for this patient?  1. Check the care team in [facility] and look for a) attending/consulting TRH provider listed and b) the [facility] team listed 2. Log into [email or web address] and use [place]'s universal password to access. If you do not have the password, please contact the hospital operator. 3. Locate the [facility] provider you are looking for under [facility] and page to a number that you can be directly reached. 4. If you still have difficulty reaching the provider, please page the [facility] (Director on Call) for the Hospitalists listed on amion for assistance.

## 2019-12-06 ENCOUNTER — Inpatient Hospital Stay (HOSPITAL_COMMUNITY): Payer: Medicare Other | Attending: Hematology | Admitting: Hematology

## 2019-12-06 ENCOUNTER — Other Ambulatory Visit: Payer: Self-pay

## 2019-12-06 ENCOUNTER — Other Ambulatory Visit (HOSPITAL_COMMUNITY): Payer: Self-pay | Admitting: Surgery

## 2019-12-06 VITALS — BP 134/83 | HR 112 | Temp 96.7°F | Resp 16 | Wt 168.1 lb

## 2019-12-06 DIAGNOSIS — C3412 Malignant neoplasm of upper lobe, left bronchus or lung: Secondary | ICD-10-CM | POA: Diagnosis not present

## 2019-12-06 DIAGNOSIS — Z993 Dependence on wheelchair: Secondary | ICD-10-CM | POA: Diagnosis not present

## 2019-12-06 DIAGNOSIS — Z87891 Personal history of nicotine dependence: Secondary | ICD-10-CM | POA: Insufficient documentation

## 2019-12-06 DIAGNOSIS — C3432 Malignant neoplasm of lower lobe, left bronchus or lung: Secondary | ICD-10-CM | POA: Diagnosis not present

## 2019-12-06 DIAGNOSIS — C7931 Secondary malignant neoplasm of brain: Secondary | ICD-10-CM | POA: Insufficient documentation

## 2019-12-06 DIAGNOSIS — C3490 Malignant neoplasm of unspecified part of unspecified bronchus or lung: Secondary | ICD-10-CM

## 2019-12-06 DIAGNOSIS — C787 Secondary malignant neoplasm of liver and intrahepatic bile duct: Secondary | ICD-10-CM | POA: Insufficient documentation

## 2019-12-06 DIAGNOSIS — M069 Rheumatoid arthritis, unspecified: Secondary | ICD-10-CM | POA: Insufficient documentation

## 2019-12-06 NOTE — Patient Instructions (Signed)
[place] at [facility] Discharge Instructions  You were seen today by [name]. He went over your recent results and scans. You will be scheduled for a CT scan of your chest and abdomen before your next visit. [name] will see you back in 1 to 2 weeks for labs and follow up.   Thank you for choosing [place] at [facility] to provide your oncology and hematology care.  To afford each patient quality time with our provider, please arrive at least 15 minutes before your scheduled appointment time.   If you have a lab appointment with the [facility] please come in thru the Main Entrance and check in at the main information desk  You need to re-schedule your appointment should you arrive 10 or more minutes late.  We strive to give you quality time with our providers, and arriving late affects you and other patients whose appointments are after yours.  Also, if you no show three or more times for appointments you may be dismissed from the clinic at the providers discretion.     Again, thank you for choosing [facility]  Our hope is that these requests will decrease the amount of time that you wait before being seen by our physicians.       _____________________________________________________________  Should you have questions after your visit to [facility], please contact our office at [phone] between the hours of 8:00 a.m. and 4:30 p.m.  Voicemails left after 4:00 p.m. will not be returned until the following business day.  For prescription refill requests, have your pharmacy contact our office and allow 72 hours.    Cancer Center Support Programs:   > Cancer Support Group  2nd Tuesday of the month 1pm-2pm, Journey Room

## 2019-12-06 NOTE — Progress Notes (Signed)
Rose Lutz, [place] [address]   CLINIC:  Medical Oncology/Hematology  PCP:  Scherrie Bateman [address] / [place] [address] [phone]   REASON FOR VISIT:  Follow-up for metastatic bilateral non-small cell lung cancer to liver and brain  PRIOR THERAPY:  1. Platinum and pemetrexed x 4 cycles from 04/02/2017 to 06/08/2017. 2. Left upper lobectomy on 07/08/2017. 3. Carbo/Pem/Pembro x 4 cycles from 09/20/2018 to 12/06/2018. 4. Pembrolizumab maintenance from 01/03/2019 to 04/25/2019. 5. Pemetrexed maintenance x 6 cycles from 06/27/2019 to 11/09/2019.  NGS Results: CFTR carrier  CURRENT THERAPY: Observation  BRIEF ONCOLOGIC HISTORY:  Oncology History  Primary malignant neoplasm of left upper lobe of lung ([place])  01/22/2017 Initial Diagnosis   Primary malignant neoplasm of left upper lobe of lung ([place])   09/05/2019 Genetic Testing   CFTR c.[id number][12]T[5] (Intronic) single pathogenic variant identified on a 64 gene custom panel.  The patient is a carrier for Cystic Fibrosis.  This does not change medical management and is not thought to be the cause of her cancer or that in the family. The Custom Hereditary Gene Panel offered by [facility] includes sequencing and/or deletion duplication testing of the following 64 genes: APC*, ATM*, AXIN2, BARD1, BLM, BMPR1A, BRCA1, BRCA2, BRIP1, BUB1B, CASR, CDH1, CDK4, CDKN2A (p14ARF), CDKN2A (p16INK4a), CEP57*, CFTR*, CHEK2, CPA1, CTNNA1, CTRC, DICER1*, ENG*, EPCAM*, FANCC, FLCN, GALNT12, GREM1*, HOXB13, KIT, MEN1*, MLH1*, MLH3*, MSH2*, MSH3*, MSH6*, MUTYH, NBN, NF1*, NTHL1, PALB2, PALLD, PDGFRA, PMS2*, POLD1*, POLE, PRSS1*, PTEN*, RAD50, RAD51C, RAD51D, RNF43, RPS20, SDHA*, SDHB, SDHC*, SDHD, SMAD4, SMARCA4, SPINK1, STK11, TP53, TSC1*, TSC2, and VHL.  The report date is September 05, 2019.   Metastatic non-small cell lung cancer ([place])  08/19/2019 Initial Diagnosis   Metastatic non-small cell lung  cancer ([place])   09/05/2019 -  Chemotherapy   The patient had ondansetron (ZOFRAN) injection 8 mg, 8 mg (100 % of original dose 8 mg), Intravenous,  Once, 1 of 1 cycle Dose modification: 8 mg (original dose 8 mg, Cycle 1) ondansetron (ZOFRAN) 8 mg in sodium chloride 0.9 % 50 mL IVPB, , Intravenous,  Once, 4 of 5 cycles Administration: 8 mg (09/05/2019), 8 mg (09/27/2019), 8 mg (10/19/2019), 8 mg (11/09/2019) PEMEtrexed (ALIMTA) 1,000 mg in sodium chloride 0.9 % 100 mL chemo infusion, 500 mg/m2 = 1,000 mg, Intravenous,  Once, 4 of 5 cycles Administration: 1,000 mg (09/05/2019), 1,000 mg (09/27/2019), 1,000 mg (10/19/2019), 1,000 mg (11/09/2019) fosaprepitant (EMEND) 150 mg in sodium chloride 0.9 % 145 mL IVPB, 150 mg, Intravenous,  Once, 1 of 2 cycles Administration: 150 mg (11/09/2019)  for chemotherapy treatment.      CANCER STAGING: Cancer Staging Primary malignant neoplasm of left upper lobe of lung ([place]) Staging form: Lung, AJCC 8th Edition - Clinical stage from 01/22/2017: Stage IIIA (cT4, cN0, cM0) - Unsigned   INTERVAL HISTORY:  [name], a 69 y.o. female, returns for routine follow-up of her metastatic bilateral non-small cell lung cancer to liver and brain. Annell was last seen on 11/09/2019.   Today she is accompanied by her husband and she reports feeling better. She was hospitalized recently for a UTI and her symptoms have improved. She did not tolerate Oxycontin well and was not eating, so she stopped taking it; she is not on any pain meds at the moment. She reports having some right flank pain which started 2 weeks ago, before she went to the hospital. Her appetite is still decreased and she is  not eating at her baseline. She has not fallen since leaving the hospital. She denies having N/V or vision changes.   REVIEW OF SYSTEMS:  Review of Systems  Constitutional: Positive for appetite change (40%) and fatigue (50%).  Eyes: Negative for eye problems.  Respiratory:  Positive for wheezing.   Gastrointestinal: Positive for abdominal pain (7/10 R flank pain), constipation and diarrhea. Negative for nausea and vomiting.  Neurological: Positive for dizziness (position changes & falls).  All other systems reviewed and are negative.   PAST MEDICAL/SURGICAL HISTORY:  Past Medical History:  Diagnosis Date  . Chronic pain   . Depression   . Essential hypertension   . Family history of breast cancer   . Family history of colon cancer   . Family history of pancreatic cancer   . GERD (gastroesophageal reflux disease)   . History of lung cancer 06/2017  . Osteoarthritis   . Pulmonary asbestosis ([place])   . Rheumatoid arthritis [facility])    Past Surgical History:  Procedure Laterality Date  . ABDOMINAL HYSTERECTOMY    . BILATERAL OOPHORECTOMY    . CESAREAN SECTION     x3  . FEMUR FRACTURE SURGERY    . KNEE ARTHROPLASTY Right   . LUNG CANCER SURGERY Left 07/08/2017  . pelvis fracture/surgery     Dr. Cheri Rous  . PORTACATH PLACEMENT Right 09/05/2019   Procedure: INSERTION PORT-A-CATH;  Surgeon: Aviva Signs, MD;  Location: [facility];  Service: General;  Laterality: Right;  . TONSILLECTOMY    . VIDEO BRONCHOSCOPY WITH ENDOBRONCHIAL NAVIGATION Left 12/31/2016   Procedure: VIDEO BRONCHOSCOPY WITH ENDOBRONCHIAL NAVIGATION, left upper lung lobe;  Surgeon: Collene Gobble, MD;  Location: [facility];  Service: Thoracic;  Laterality: Left;  . YAG LASER APPLICATION Right 62/95/2841   Procedure: YAG LASER APPLICATION;  Surgeon: Rutherford Guys, MD;  Location: [facility];  Service: Ophthalmology;  Laterality: Right;  . YAG LASER APPLICATION Left 32/44/0102   Procedure: YAG LASER APPLICATION;  Surgeon: Rutherford Guys, MD;  Location: [facility];  Service: Ophthalmology;  Laterality: Left;    SOCIAL HISTORY:  Social History   Socioeconomic History  . Marital status: Married    Spouse name: Not on file  . Number of children: 3  . Years of education: Not on file  . Highest education level:  Not on file  Occupational History  . Occupation: DISABLED  Tobacco Use  . Smoking status: Former Smoker    Packs/day: 1.25    Years: 38.00    Pack years: 47.50    Types: Cigarettes    Start date: 01/21/1968    Quit date: 01/20/2006    Years since quitting: 13.8  . Smokeless tobacco: Never Used  Vaping Use  . Vaping Use: Never used  Substance and Sexual Activity  . Alcohol use: No    Alcohol/week: 0.0 standard drinks  . Drug use: No  . Sexual activity: Not Currently  Other Topics Concern  . Not on file  Social History Narrative  . Not on file   Social Determinants of Health   Financial Resource Strain: Medium Risk  . Difficulty of Paying Living Expenses: Somewhat hard  Food Insecurity: No Food Insecurity  . Worried About Charity fundraiser in the Last Year: Never true  . Ran Out of Food in the Last Year: Never true  Transportation Needs: No Transportation Needs  . Lack of Transportation (Medical): No  . Lack of Transportation (Non-Medical): No  Physical Activity: Inactive  . Days of Exercise per  Week: 0 days  . Minutes of Exercise per Session: 0 min  Stress: Stress Concern Present  . Feeling of Stress : To some extent  Social Connections: Moderately Isolated  . Frequency of Communication with Friends and Family: Twice a week  . Frequency of Social Gatherings with Friends and Family: Never  . Attends Religious Services: 1 to 4 times per year  . Active Member of Clubs or Organizations: No  . Attends Archivist Meetings: Never  . Marital Status: Married  Human resources officer Violence: Not At Risk  . Fear of Current or Ex-Partner: No  . Emotionally Abused: No  . Physically Abused: No  . Sexually Abused: No    FAMILY HISTORY:  Family History  Problem Relation Age of Onset  . Breast cancer Mother 30       d. 75  . Colon cancer Maternal Grandmother 26       d. 53  . Pancreatic cancer Son 35  . Healthy Father   . Arthritis Sister   . Arthritis Paternal  Grandmother   . Other Son 78       MVA  . Esophageal cancer Maternal Aunt     CURRENT MEDICATIONS:  Current Outpatient Medications  Medication Sig Dispense Refill  . alendronate (FOSAMAX) 35 MG tablet Take 35 mg by mouth every 7 (seven) days.     Marland Kitchen amitriptyline (ELAVIL) 100 MG tablet Take 100 mg by mouth at bedtime.    . Ascorbic Acid (VITAMIN C) 250 MG CHEW Chew 250 mg by mouth daily.     . cetirizine (ZYRTEC) 10 MG tablet Take 10 mg by mouth daily as needed for allergies.     . diphenhydrAMINE (BENADRYL) 25 mg capsule Take 25 mg by mouth every 6 (six) hours as needed for itching or allergies.     Marland Kitchen gabapentin (NEURONTIN) 100 MG capsule Take 100 mg by mouth 3 (three) times daily.   5  . hydrALAZINE (APRESOLINE) 25 MG tablet Take 25 mg by mouth daily.     . hydroxychloroquine (PLAQUENIL) 200 MG tablet Take 2 tablets (400 mg total) by mouth every Monday, Tuesday, Wednesday, Thursday, and Friday.    . leflunomide (ARAVA) 10 MG tablet Take 2 tablets (20 mg total) by mouth daily.    Marland Kitchen losartan-hydrochlorothiazide (HYZAAR) 50-12.5 MG tablet Take 1 tablet by mouth daily.    . Misc. Devices MISC Please provide patient with oxygen via nasal cannula at 2 liters per minute continuous.  Please provide tubing for oxygen delivery. 1 each 99  . Multiple Vitamin (MULTIVITAMIN WITH MINERALS) TABS tablet Take 1 tablet by mouth daily.    Marland Kitchen omeprazole (PRILOSEC) 20 MG capsule Take 20 mg by mouth daily.    . Oxycodone HCl 10 MG TABS Take 10 mg by mouth 4 (four) times daily as needed.    . potassium chloride (MICRO-K) 10 MEQ CR capsule Take 10 mEq by mouth 2 (two) times daily.     . prochlorperazine (COMPAZINE) 10 MG tablet Take 10 mg by mouth every 6 (six) hours as needed for nausea or vomiting.     . senna (SENOKOT) 8.6 MG tablet Take 1 tablet by mouth at bedtime.     . Vitamin D, Ergocalciferol, (DRISDOL) 50000 units CAPS capsule Take 1 capsule (50,000 Units total) by mouth every 7 (seven) days. 12 capsule  0  . folic acid (FOLVITE) 1 MG tablet Take 1 tablet (1 mg total) by mouth daily. 90 tablet 4  . ondansetron (ZOFRAN-ODT)  4 MG disintegrating tablet Take 4 mg by mouth every 8 (eight) hours as needed for nausea or vomiting.  (Patient not taking: Reported on 12/06/2019)     No current facility-administered medications for this visit.    ALLERGIES:  Allergies  Allergen Reactions  . Quinine Derivatives Nausea And Vomiting  . Sulfa Antibiotics Nausea And Vomiting  . Barium-Containing Compounds   . Percocet [Oxycodone-Acetaminophen] Other (See Comments)    Headahces    PHYSICAL EXAM:  Performance status (ECOG): 2 - Symptomatic, <50% confined to bed  Vitals:   12/06/19 1133  BP: 134/83  Pulse: (!) 112  Resp: 16  Temp: (!) 96.7 F (35.9 C)  SpO2: 94%   Wt Readings from Last 3 Encounters:  12/06/19 168 lb 1.6 oz (76.2 kg)  11/29/19 167 lb 8.8 oz (76 kg)  11/09/19 179 lb 9.6 oz (81.5 kg)   Physical Exam Vitals reviewed.  Constitutional:      Appearance: Normal appearance.     Comments: Wheelchair-bound  Cardiovascular:     Rate and Rhythm: Normal rate and regular rhythm.     Pulses: Normal pulses.     Heart sounds: Normal heart sounds.  Pulmonary:     Effort: Pulmonary effort is normal.     Breath sounds: Normal breath sounds.  Chest:     Comments: Port-a-Cath in R chest Abdominal:     Tenderness: There is abdominal tenderness (R lower flank TTP).  Neurological:     General: No focal deficit present.     Mental Status: She is alert and oriented to person, place, and time.  Psychiatric:        Mood and Affect: Mood normal.        Behavior: Behavior normal.      LABORATORY DATA:  I have reviewed the labs as listed.  CBC Latest Ref Rng & Units 12/01/2019 11/29/2019 11/09/2019  WBC 4.0 - 10.5 K/uL 8.2 8.0 5.9  Hemoglobin 12.0 - 15.0 g/dL 8.6(L) 8.9(L) 9.3(L)  Hematocrit 36 - 46 % 28.2(L) 29.1(L) 29.6(L)  Platelets 150 - 400 K/uL 277 278 277   CMP Latest Ref Rng &  Units 12/01/2019 11/29/2019 11/09/2019  Glucose 70 - 99 mg/dL 95 100(H) 117(H)  BUN 8 - 23 mg/dL _0 Creatinine 0.44 - 1.00 mg/dL 1.22(H) 1.28(H) 1.29(H)  Sodium 135 - 145 mmol/L 137 138 133(L)  Potassium 3.5 - 5.1 mmol/L 3.7 3.7 4.7  Chloride 98 - 111 mmol/L 105 105 102  CO2 22 - 32 mmol/L 22 22 17(L)  Calcium 8.9 - 10.3 mg/dL 8.5(L) 8.8(L) 9.0  Total Protein 6.5 - 8.1 g/dL 6.4(L) 7.2 7.6  Total Bilirubin 0.3 - 1.2 mg/dL 0.5 0.7 0.4  Alkaline Phos 38 - 126 U/L 84 98 93  AST 15 - 41 U/L 39 42(H) 45(H)  ALT 0 - 44 U/L _1 DIAGNOSTIC IMAGING:  I have independently reviewed the scans and discussed with the patient. CT Head Wo Contrast  Result Date: 11/29/2019 CLINICAL DATA:  Mental status change. EXAM: CT HEAD WITHOUT CONTRAST TECHNIQUE: Contiguous axial images were obtained from the base of the skull through the vertex without intravenous contrast. COMPARISON:  MRI 10/11/2019. FINDINGS: Brain: When comparing across modalities, there appears to be slightly increased edema associated with known metastatic lesion in the medial left occipital lobe, which may represent progressive disease. No substantial mass effect. Grossly similar vasogenic edema associated with additional known metastatic lesions, with lesions better characterized on prior MRI. No  evidence of acute large vascular territory infarct. No acute hemorrhage. No hydrocephalus. Vascular: Calcific atherosclerosis. Skull: No acute fracture or abnormal bone lesions identified. Sinuses/Orbits: No acute findings. Other: No mastoid effusions. IMPRESSION: 1. When comparing across modalities, there appears to be slightly increased edema associated with known metastatic lesion in the medial left occipital lobe. No substantial mass effect. MRI with and without contrast could further evaluate if clinically indicated. 2. Otherwise, grossly similar vasogenic edema associated with additional metastatic lesions which were better characterized  on prior MRI. Electronically Signed   By: Margaretha Sheffield MD   On: 11/29/2019 18:04   DG Chest Port 1 View  Result Date: 11/29/2019 CLINICAL DATA:  Dyspnea EXAM: PORTABLE CHEST 1 VIEW COMPARISON:  09/05/2019 FINDINGS: Surgical changes of left upper lobectomy with associated left-sided volume loss is again noted. Opacity at the medial left apex likely represents vascular shadow and/or pleural thickening. Stable scarring at the left lung base. Right lung is clear. No pneumothorax or pleural effusion. Right subclavian chest port tip is unchanged at the expected confluence of the superior vena cava. Cardiac size within normal limits. Advanced degenerative changes are seen within the shoulders. No acute bone abnormality. IMPRESSION: No active disease. Electronically Signed   By: Fidela Salisbury MD   On: 11/29/2019 16:58     ASSESSMENT:  1. Metastatic non-small cell lung cancer: -History of stage IIIa (T3 N1 M0) non-small cell lung cancer presented with 2 masses in the left upper lobe as well as questionable left hilar adenopathy diagnosed in December 2018. -4 cycles of platinum and pemetrexed from 04/02/2017 through 06/08/2017 -Left upper lobectomy with en blocLLLsuperior segmentectomy on 07/08/2017, pathology showing invasive adenocarcinoma, moderately to poorly differentiated with focal treatment effect and necrosis, 4.3 cm (mass #1) and invasive squamous cell carcinoma, moderately to poorly differentiated, 3.1 cm (mass #2), margins negative, YPT3Y PN 1. -PET scan on 08/04/2018 showed hypermetabolic right apical and retroaortic postradiation changes, intensely hypermetabolic subpleural spiculated nodules/correlating mass in the superior segment of the left lower lobe suspicious for locoregional disease recurrence. Small left pleural effusion demonstrated mild FDG activity. Right hepatic lobe segment 5 hypodensity with intense hypermetabolic activity. Hypermetabolic and markedly enlarged porta hepatis  lymph nodes intimately related to the caudate lobe. -MRI of the brain on 09/16/2018 shows 2 ring-enhancing lesions in the parasagittal right frontal lobe and long the left parieto-occipital sulcus measuring 1.7 and 1.3 cm respectively. -4 cycles of carbo/Pem/Pembro from 09/20/2018 through 12/06/2018 -Pembrolizumab maintenance from 01/03/2019 through 04/25/2019, with flare of rheumatoid arthritis. -Started on Remicade on 05/27/2019. -She was switched to pemetrexed maintenance on 06/27/2019. -She moved to [facility]. She was due for her pemetrexed on 08/08/2019. -She walks with the help of her walker and does very minimal household work. She needs assistance taking shower and clothing herself. -She has tolerated pemetrexed except some tiredness and decreased appetite lasting 2 to 3 days after each treatment. -CT CAP on 09/02/2019 shows masslike consolidation in the left lower lobe measuring 5.3 x 2.2 cm. Right hepatic lobe lesion measures 5.7 x 4.6 cm. Additional 1.6 x 1.3 low-attenuation lesion medial posterior right hepatic lobe. Enlarged porta hepatic and preaortic lymph nodes. Left hemisacral insufficiency fracture. Fracture through the left superior and inferior pubic rami. Wedge compression deformity of T4 vertebral body.  2. Brain metastasis: -Underwent SRS to the brain meta stasis in the left temporal lobe, right frontal and left frontal lobes. -Last MRI of the brain was on 06/15/2019. -Brain MRI on 10/11/2019 shows 1.7 x 1.5  cm inferior right frontal lesion, 1.5 x 1.3 cm medial left occipital lesion, 5 mm inferior right frontal lesion, 5 mm left occipital lesion, 3 mm left frontal operculum lesion. -I have compared them with MRI from [place]. All 5 lesions were present previously and the first 2 lesions have significantly decreased in size.  3. Family history: -Maternal grandmother had colon cancer. -Maternal aunt had throat cancer. -Mother had breast cancer. -Oldest son died of  pancreatic cancer.   PLAN:  1. Metastatic non-small cell lung cancer: -She was recently hospitalized with mental status changes due to metabolic encephalopathy.  She was found to have UTI and was treated with IV antibiotics and some oral antibiotics.  CT scan of the brain did not reveal any major abnormalities. -She is at home and is recovering well.  She complained of occasional pain in the right lateral rib region.  She has liver lesions from malignancy in that area. -She has completed 4 cycles of pemetrexed.  I have recommended holding her treatment.  I have recommended restaging CT CAP. -RTC after the CT scan.  2. Brain metastasis: -Brain MRI on 10/11/2019 with no evidence of progression.  3. Family history: -[facility] testing showed CFTR heterozygosity.  4. Rheumatoid arthritis: -Remicade is not needed as she is receiving chemotherapy.  5. Constipation: -Continue lactulose as needed.  6.  Hypomagnesemia: -Her magnesium was severely low at 1.3 on 12/01/2019.  She received IV magnesium. -I plan to repeat magnesium at next labs.   Orders placed this encounter:  No orders of the defined types were placed in this encounter.    Derek Jack, MD [place] [phone]   I, Milinda Antis, am acting as a scribe for Dr. Sanda Linger.  I, Derek Jack MD, have reviewed the above documentation for accuracy and completeness, and I agree with the above.

## 2019-12-08 DIAGNOSIS — R55 Syncope and collapse: Secondary | ICD-10-CM | POA: Diagnosis not present

## 2019-12-08 DIAGNOSIS — N390 Urinary tract infection, site not specified: Secondary | ICD-10-CM | POA: Diagnosis not present

## 2019-12-08 DIAGNOSIS — C7800 Secondary malignant neoplasm of unspecified lung: Secondary | ICD-10-CM | POA: Diagnosis not present

## 2019-12-20 ENCOUNTER — Other Ambulatory Visit (HOSPITAL_COMMUNITY): Payer: Medicare Other

## 2019-12-20 ENCOUNTER — Other Ambulatory Visit: Payer: Self-pay

## 2019-12-20 ENCOUNTER — Ambulatory Visit (HOSPITAL_COMMUNITY)
Admission: RE | Admit: 2019-12-20 | Discharge: 2019-12-20 | Disposition: A | Discharge status: Home / Self Care | Payer: Medicare Other | Source: Ambulatory Visit | Attending: Hematology | Admitting: Hematology

## 2019-12-20 DIAGNOSIS — C3490 Malignant neoplasm of unspecified part of unspecified bronchus or lung: Secondary | ICD-10-CM | POA: Diagnosis not present

## 2019-12-20 DIAGNOSIS — I2510 Atherosclerotic heart disease of native coronary artery without angina pectoris: Secondary | ICD-10-CM | POA: Diagnosis not present

## 2019-12-20 DIAGNOSIS — I10 Essential (primary) hypertension: Secondary | ICD-10-CM | POA: Diagnosis not present

## 2019-12-20 DIAGNOSIS — S32512A Fracture of superior rim of left pubis, initial encounter for closed fracture: Secondary | ICD-10-CM | POA: Diagnosis not present

## 2019-12-20 DIAGNOSIS — Z85118 Personal history of other malignant neoplasm of bronchus and lung: Secondary | ICD-10-CM | POA: Diagnosis not present

## 2019-12-20 DIAGNOSIS — I270 Primary pulmonary hypertension: Secondary | ICD-10-CM | POA: Diagnosis not present

## 2019-12-20 MED ORDER — IOHEXOL 300 MG/ML  SOLN
100.0000 mL | Freq: Once | INTRAMUSCULAR | Status: AC | PRN
  Administered 2019-12-20: 100 mL via INTRAVENOUS

## 2019-12-20 MED ORDER — IOHEXOL 9 MG/ML PO SOLN
ORAL | Status: AC
  Filled 2019-12-20: qty 1000

## 2019-12-21 DIAGNOSIS — C3490 Malignant neoplasm of unspecified part of unspecified bronchus or lung: Secondary | ICD-10-CM | POA: Diagnosis not present

## 2019-12-23 ENCOUNTER — Ambulatory Visit (HOSPITAL_COMMUNITY): Payer: Medicare Other | Admitting: Oncology

## 2019-12-23 DIAGNOSIS — Z681 Body mass index (BMI) 19 or less, adult: Secondary | ICD-10-CM | POA: Diagnosis not present

## 2019-12-23 DIAGNOSIS — M069 Rheumatoid arthritis, unspecified: Secondary | ICD-10-CM | POA: Diagnosis not present

## 2019-12-23 DIAGNOSIS — C3492 Malignant neoplasm of unspecified part of left bronchus or lung: Secondary | ICD-10-CM | POA: Diagnosis not present

## 2019-12-23 DIAGNOSIS — R21 Rash and other nonspecific skin eruption: Secondary | ICD-10-CM | POA: Diagnosis not present

## 2019-12-27 ENCOUNTER — Encounter (HOSPITAL_COMMUNITY): Payer: Self-pay | Admitting: *Deleted

## 2019-12-27 ENCOUNTER — Other Ambulatory Visit: Payer: Self-pay

## 2019-12-27 ENCOUNTER — Emergency Department (HOSPITAL_COMMUNITY): Payer: Medicare Other

## 2019-12-27 ENCOUNTER — Emergency Department (HOSPITAL_COMMUNITY)
Admission: EM | Admit: 2019-12-27 | Discharge: 2019-12-28 | Disposition: A | Discharge status: Home / Self Care | Payer: Medicare Other | Attending: Emergency Medicine | Admitting: Emergency Medicine

## 2019-12-27 ENCOUNTER — Inpatient Hospital Stay (HOSPITAL_COMMUNITY): Payer: Medicare Other | Admitting: Oncology

## 2019-12-27 DIAGNOSIS — Z87891 Personal history of nicotine dependence: Secondary | ICD-10-CM | POA: Insufficient documentation

## 2019-12-27 DIAGNOSIS — Z85118 Personal history of other malignant neoplasm of bronchus and lung: Secondary | ICD-10-CM | POA: Insufficient documentation

## 2019-12-27 DIAGNOSIS — Z20822 Contact with and (suspected) exposure to covid-19: Secondary | ICD-10-CM | POA: Diagnosis not present

## 2019-12-27 DIAGNOSIS — G936 Cerebral edema: Secondary | ICD-10-CM | POA: Diagnosis not present

## 2019-12-27 DIAGNOSIS — R410 Disorientation, unspecified: Secondary | ICD-10-CM

## 2019-12-27 DIAGNOSIS — C7931 Secondary malignant neoplasm of brain: Secondary | ICD-10-CM

## 2019-12-27 DIAGNOSIS — Z79899 Other long term (current) drug therapy: Secondary | ICD-10-CM | POA: Diagnosis not present

## 2019-12-27 DIAGNOSIS — I10 Essential (primary) hypertension: Secondary | ICD-10-CM | POA: Diagnosis not present

## 2019-12-27 LAB — BASIC METABOLIC PANEL
Anion gap: 15 (ref 5–15)
BUN: 15 mg/dL (ref 8–23)
CO2: 20 mmol/L — ABNORMAL LOW (ref 22–32)
Calcium: 9.3 mg/dL (ref 8.9–10.3)
Chloride: 100 mmol/L (ref 98–111)
Creatinine, Ser: 1.3 mg/dL — ABNORMAL HIGH (ref 0.44–1.00)
GFR, Estimated: 44 mL/min — ABNORMAL LOW (ref >60)
Glucose, Bld: 134 mg/dL — ABNORMAL HIGH (ref 70–99)
Potassium: 3.4 mmol/L — ABNORMAL LOW (ref 3.5–5.1)
Sodium: 135 mmol/L (ref 135–145)

## 2019-12-27 LAB — CBC WITH DIFFERENTIAL/PLATELET
Abs Immature Granulocytes: 0.07 10*3/uL (ref 0.00–0.07)
Basophils Absolute: 0 10*3/uL (ref 0.0–0.1)
Basophils Relative: 0 %
Eosinophils Absolute: 0 10*3/uL (ref 0.0–0.5)
Eosinophils Relative: 0 %
HCT: 30 % — ABNORMAL LOW (ref 36.0–46.0)
Hemoglobin: 9.4 g/dL — ABNORMAL LOW (ref 12.0–15.0)
Immature Granulocytes: 1 %
Lymphocytes Relative: 15 %
Lymphs Abs: 1.7 10*3/uL (ref 0.7–4.0)
MCH: 33.6 pg (ref 26.0–34.0)
MCHC: 31.3 g/dL (ref 30.0–36.0)
MCV: 107.1 fL — ABNORMAL HIGH (ref 80.0–100.0)
Monocytes Absolute: 1.1 10*3/uL — ABNORMAL HIGH (ref 0.1–1.0)
Monocytes Relative: 9 %
Neutro Abs: 8.6 10*3/uL — ABNORMAL HIGH (ref 1.7–7.7)
Neutrophils Relative %: 75 %
Platelets: 242 10*3/uL (ref 150–400)
RBC: 2.8 MIL/uL — ABNORMAL LOW (ref 3.87–5.11)
RDW: 15 % (ref 11.5–15.5)
WBC: 11.5 10*3/uL — ABNORMAL HIGH (ref 4.0–10.5)
nRBC: 0 % (ref 0.0–0.2)

## 2019-12-27 LAB — URINALYSIS, ROUTINE W REFLEX MICROSCOPIC
Bacteria, UA: NONE SEEN
Bilirubin Urine: NEGATIVE
Glucose, UA: NEGATIVE mg/dL
Hgb urine dipstick: NEGATIVE
Ketones, ur: 5 mg/dL — AB
Nitrite: NEGATIVE
Protein, ur: NEGATIVE mg/dL
Specific Gravity, Urine: 1.012 (ref 1.005–1.030)
pH: 6 (ref 5.0–8.0)

## 2019-12-27 LAB — RESP PANEL BY RT-PCR (FLU A&B, COVID) ARPGX2
Influenza A by PCR: NEGATIVE
Influenza B by PCR: NEGATIVE
SARS Coronavirus 2 by RT PCR: NEGATIVE

## 2019-12-27 MED ORDER — DEXAMETHASONE 4 MG PO TABS: 4.0000 mg | ORAL_TABLET | Freq: Four times a day (QID) | ORAL | 0 refills | Status: AC

## 2019-12-27 MED ORDER — SODIUM CHLORIDE 0.9 % IV BOLUS
500.0000 mL | Freq: Once | INTRAVENOUS | Status: AC
  Administered 2019-12-27: 500 mL via INTRAVENOUS

## 2019-12-27 MED ORDER — SODIUM CHLORIDE 0.9 % IV BOLUS
1000.0000 mL | Freq: Once | INTRAVENOUS | Status: AC
  Administered 2019-12-27: 1000 mL via INTRAVENOUS

## 2019-12-27 MED ORDER — DEXAMETHASONE 4 MG PO TABS
10.0000 mg | ORAL_TABLET | Freq: Once | ORAL | Status: AC
  Administered 2019-12-27: 10 mg via ORAL
  Filled 2019-12-27: qty 3

## 2019-12-27 NOTE — ED Provider Notes (Signed)
[facility] EMERGENCY DEPARTMENT Provider Note   CSN: [id number] Arrival date & time: 12/27/19  1417     History Chief Complaint  Patient presents with  . Altered Mental Status    Rose Lutz is a 69 y.o. female.  HPI Patient presents for evaluation of worsening confusion and malaise over the last couple days.  She has similar episode, 1 month ago when she had a UTI and was admitted.  She is unable to give any history.  History is from her husband who is at the bedside.  He is also having trouble managing her home, because of her disability.  Today she had extensive stooling, which was incontinent and he had to get a neighbor, to help him get her cleaned up.  Apparently the primary care doctor and oncologist are both working to get home health into the home for assistance.  Husband understands that there is been a delay and the soonest they can come out is 01/02/2020.  The patient has not complained of pain.  Patient has been seeing things, that are not there, and this has been ongoing.  She has no unstable brain metastases, most recently evaluated by her oncologist, 12/06/2019.  Has been does not know of any other acute abnormalities.  Level five caveat-confusion    Past Medical History:  Diagnosis Date  . Chronic pain   . Depression   . Essential hypertension   . Family history of breast cancer   . Family history of colon cancer   . Family history of pancreatic cancer   . GERD (gastroesophageal reflux disease)   . History of lung cancer 06/2017  . Osteoarthritis   . Pulmonary asbestosis ([place])   . Rheumatoid arthritis [facility])     Patient Active Problem List   Diagnosis Date Noted  . Hypomagnesemia 12/01/2019  . Palliative care by specialist   . Encounter for hospice care discussion   . Altered mental status, unspecified 11/29/2019  . Acute lower UTI 11/29/2019  . UTI (urinary tract infection) 11/29/2019  . Genetic testing 09/06/2019  . Nonsquamous nonsmall cell  neoplasm of left lung ([place])   . Goals of care, counseling/discussion 08/31/2019  . Family history of pancreatic cancer   . Family history of breast cancer   . Family history of colon cancer   . Metastatic non-small cell lung cancer ([place]) 08/19/2019  . Chondrocalcinosis 02/16/2018  . History of total right knee replacement 09/22/2017  . Primary malignant neoplasm of left upper lobe of lung ([place]) 01/22/2017  . Family history of cancer 01/22/2017  . Mass of left lung 12/19/2016  . Rheumatoid arthritis involving multiple sites with positive rheumatoid factor ([place]) 05/06/2016  . High risk medications (not anticoagulants) long-term use 05/06/2016  . Primary osteoarthritis of both hands 05/06/2016  . Primary osteoarthritis of left knee 05/06/2016  . Pain in left ankle and joints of left foot 05/06/2016  . Other fatigue 05/06/2016  . Dizziness and giddiness 02/05/2015  . HTN (hypertension) 02/05/2015    Past Surgical History:  Procedure Laterality Date  . ABDOMINAL HYSTERECTOMY    . BILATERAL OOPHORECTOMY    . CESAREAN SECTION     x3  . FEMUR FRACTURE SURGERY    . KNEE ARTHROPLASTY Right   . LUNG CANCER SURGERY Left 07/08/2017  . pelvis fracture/surgery     Dr. Cheri Rous  . PORTACATH PLACEMENT Right 09/05/2019   Procedure: INSERTION PORT-A-CATH;  Surgeon: Aviva Signs, MD;  Location: [facility];  Service: General;  Laterality: Right;  .  TONSILLECTOMY    . VIDEO BRONCHOSCOPY WITH ENDOBRONCHIAL NAVIGATION Left 12/31/2016   Procedure: VIDEO BRONCHOSCOPY WITH ENDOBRONCHIAL NAVIGATION, left upper lung lobe;  Surgeon: Collene Gobble, MD;  Location: [facility];  Service: Thoracic;  Laterality: Left;  . YAG LASER APPLICATION Right 97/35/3299   Procedure: YAG LASER APPLICATION;  Surgeon: Rutherford Guys, MD;  Location: [facility];  Service: Ophthalmology;  Laterality: Right;  . YAG LASER APPLICATION Left 24/26/8341   Procedure: YAG LASER APPLICATION;  Surgeon: Rutherford Guys, MD;  Location: [facility];  Service:  Ophthalmology;  Laterality: Left;     OB History   No obstetric history on file.     Family History  Problem Relation Age of Onset  . Breast cancer Mother 59       d. 7  . Colon cancer Maternal Grandmother 72       d. 32  . Pancreatic cancer Son 30  . Healthy Father   . Arthritis Sister   . Arthritis Paternal Grandmother   . Other Son 11       MVA  . Esophageal cancer Maternal Aunt     Social History   Tobacco Use  . Smoking status: Former Smoker    Packs/day: 1.25    Years: 38.00    Pack years: 47.50    Types: Cigarettes    Start date: 01/21/1968    Quit date: 01/20/2006    Years since quitting: 13.9  . Smokeless tobacco: Never Used  Vaping Use  . Vaping Use: Never used  Substance Use Topics  . Alcohol use: No    Alcohol/week: 0.0 standard drinks  . Drug use: No    Home Medications Prior to Admission medications   Medication Sig Start Date End Date Taking? Authorizing Provider  alendronate (FOSAMAX) 35 MG tablet Take 35 mg by mouth every 7 (seven) days.  03/25/19  Yes [provider]  amitriptyline (ELAVIL) 100 MG tablet Take 100 mg by mouth at bedtime.   Yes [provider]  Ascorbic Acid (VITAMIN C) 250 MG CHEW Chew 250 mg by mouth daily.  11/13/16  Yes [provider]  cetirizine (ZYRTEC) 10 MG tablet Take 10 mg by mouth daily as needed for allergies.    Yes [provider]  diphenhydrAMINE (BENADRYL) 25 mg capsule Take 25 mg by mouth every 6 (six) hours as needed for itching or allergies.    Yes [provider]  folic acid (FOLVITE) 1 MG tablet Take 1 tablet (1 mg total) by mouth daily. 03/24/17 12/27/19 Yes Deveshwar, Abel Presto, MD  gabapentin (NEURONTIN) 100 MG capsule Take 100 mg by mouth 3 (three) times daily.  09/11/17  Yes [provider]  hydrALAZINE (APRESOLINE) 25 MG tablet Take 25 mg by mouth daily.  03/25/19  Yes [provider]  HYDROcodone-acetaminophen (NORCO) 10-325 MG tablet Take 2 tablets by  mouth every 6 (six) hours as needed for severe pain.  12/23/19  Yes [provider]  hydroxychloroquine (PLAQUENIL) 200 MG tablet Take 2 tablets (400 mg total) by mouth every Monday, Tuesday, Wednesday, Thursday, and Friday. 12/01/19  Yes Johnson, Clanford L, MD  leflunomide (ARAVA) 10 MG tablet Take 2 tablets (20 mg total) by mouth daily. 12/01/19  Yes Johnson, Clanford L, MD  losartan-hydrochlorothiazide (HYZAAR) 50-12.5 MG tablet Take 1 tablet by mouth daily. 06/29/19  Yes [provider]  Multiple Vitamin (MULTIVITAMIN WITH MINERALS) TABS tablet Take 1 tablet by mouth daily.   Yes [provider]  mupirocin ointment (BACTROBAN) 2 %  Apply 1 application topically See admin instructions. 2 to 3 times daily 12/23/19  Yes [provider]  omeprazole (PRILOSEC) 20 MG capsule Take 20 mg by mouth daily.   Yes [provider]  ondansetron (ZOFRAN-ODT) 4 MG disintegrating tablet Take 4 mg by mouth every 8 (eight) hours as needed for nausea or vomiting.  09/29/18  Yes [provider]  potassium chloride (MICRO-K) 10 MEQ CR capsule Take 10 mEq by mouth 2 (two) times daily.  05/19/19  Yes [provider]  prochlorperazine (COMPAZINE) 10 MG tablet Take 10 mg by mouth every 6 (six) hours as needed for nausea or vomiting.  06/12/19  Yes [provider]  senna (SENOKOT) 8.6 MG tablet Take 1 tablet by mouth at bedtime.  03/25/19  Yes [provider]  Vitamin D, Ergocalciferol, (DRISDOL) 50000 units CAPS capsule Take 1 capsule (50,000 Units total) by mouth every 7 (seven) days. 12/07/15  Yes Panwala, Naitik, PA-C  cephALEXin (KEFLEX) 500 MG capsule Take 500 mg by mouth 4 (four) times daily. Patient not taking: Reported on 12/27/2019 12/08/19   [provider]  dexamethasone (DECADRON) 4 MG tablet Take 1 tablet (4 mg total) by mouth 4 (four) times daily. 12/27/19   Daleen Bo, MD  Misc. Devices MISC Please provide patient with oxygen  via nasal cannula at 2 liters per minute continuous.  Please provide tubing for oxygen delivery. 10/20/19   Derek Jack, MD  Oxycodone HCl 10 MG TABS Take 10 mg by mouth 4 (four) times daily as needed. Patient not taking: Reported on 12/27/2019 10/07/19   [provider]  valACYclovir (VALTREX) 1000 MG tablet Take 1,000 mg by mouth 3 (three) times daily. Patient not taking: Reported on 12/27/2019 12/23/19   [provider]    Allergies    Oxycontin [oxycodone], Quinine derivatives, Sulfa antibiotics, Barium-containing compounds, and Percocet [oxycodone-acetaminophen]  Review of Systems   Review of Systems  Unable to perform ROS: Mental status change    Physical Exam Updated Vital Signs BP (!) 149/84   Pulse (!) 122   Temp 98.3 F (36.8 C) (Rectal)   Resp 18   Ht 5\' 3"  (1.6 m)   Wt 72.6 kg   SpO2 95%   BMI 28.34 kg/m   Physical Exam Vitals and nursing note reviewed.  Constitutional:      General: She is not in acute distress.    Appearance: She is well-developed. She is not ill-appearing, toxic-appearing or diaphoretic.  HENT:     Head: Normocephalic and atraumatic.     Right Ear: External ear normal.     Left Ear: External ear normal.  Eyes:     Conjunctiva/sclera: Conjunctivae normal.     Pupils: Pupils are equal, round, and reactive to light.  Neck:     Trachea: Phonation normal.  Cardiovascular:     Rate and Rhythm: Normal rate and regular rhythm.     Pulses: Normal pulses.  Pulmonary:     Effort: Pulmonary effort is normal. No respiratory distress.     Breath sounds: No stridor.     Comments: Room air oxygenation 97%. Abdominal:     General: There is no distension.     Palpations: Abdomen is soft.     Tenderness: There is no abdominal tenderness.  Musculoskeletal:        General: Normal range of motion.     Cervical back: Normal range of motion and neck supple.  Skin:    General: Skin is warm and  dry.  Neurological:     Mental  Status: She is alert and oriented to person, place, and time.     Cranial Nerves: No cranial nerve deficit.     Sensory: No sensory deficit.     Motor: No abnormal muscle tone.     Coordination: Coordination normal.     Comments: Conversant.  No dysarthria or aphasia.  Confused  Psychiatric:        Mood and Affect: Mood normal.        Behavior: Behavior normal.     Comments: She does not appear to be responding to internal stimuli.     ED Results / Procedures / Treatments   Labs (all labs ordered are listed, but only abnormal results are displayed) Labs Reviewed  BASIC METABOLIC PANEL - Abnormal; Notable for the following components:      Result Value   Potassium 3.4 (*)    CO2 20 (*)    Glucose, Bld 134 (*)    Creatinine, Ser 1.30 (*)    GFR, Estimated 44 (*)    All other components within normal limits  CBC WITH DIFFERENTIAL/PLATELET - Abnormal; Notable for the following components:   WBC 11.5 (*)    RBC 2.80 (*)    Hemoglobin 9.4 (*)    HCT 30.0 (*)    MCV 107.1 (*)    Neutro Abs 8.6 (*)    Monocytes Absolute 1.1 (*)    All other components within normal limits  URINALYSIS, ROUTINE W REFLEX MICROSCOPIC - Abnormal; Notable for the following components:   APPearance HAZY (*)    Ketones, ur 5 (*)    Leukocytes,Ua TRACE (*)    All other components within normal limits  RESP PANEL BY RT-PCR (FLU A&B, COVID) ARPGX2    EKG None  Radiology CT Head Wo Contrast  Result Date: 12/27/2019 CLINICAL DATA:  Initial evaluation for acute mental status change, history of metastatic non-small cell lung cancer. EXAM: CT HEAD WITHOUT CONTRAST TECHNIQUE: Contiguous axial images were obtained from the base of the skull through the vertex without intravenous contrast. COMPARISON:  Comparison made with prior CT from 11/30/2019 as well as previous brain MRI from 10/11/2019. FINDINGS: Brain: Cerebral volume within normal limits for age. No acute intracranial hemorrhage. No acute large vessel  territory infarct. No extra-axial fluid collection. Again seen are multifocal areas of vasogenic edema involving the supratentorial brain, most pronounced at the right frontal and left parietal regions, consistent with known intracranial metastatic disease. In comparison with previous exam, edema has worsened and progressed. Increased mass effect on the posterior aspect of the left lateral ventricle. No significant midline shift. Basilar cisterns remain patent. No hydrocephalus or trapping. No definite new foci identified on this limited noncontrast examination. Vascular: No hyperdense vessel. Scattered vascular calcifications noted within the carotid siphons. Skull: Scalp soft tissues and calvarium within normal limits. Sinuses/Orbits: Globes and orbital soft tissues within normal limits. Paranasal sinuses are largely clear. Trace left mastoid effusion noted. Other: None. IMPRESSION: 1. Multifocal areas of vasogenic edema involving the supratentorial brain, consistent with known history of metastatic disease. Overall, appearance is mildly worsened from previous, suggesting disease progression. No significant midline shift. 2. No other new acute intracranial abnormality. Electronically Signed   By: Jeannine Boga M.D.   On: 12/27/2019 21:11    Procedures Procedures (including critical care time)  Medications Ordered in ED Medications  sodium chloride 0.9 % bolus 500 mL (0 mLs Intravenous Stopped 12/27/19 1820)  sodium chloride 0.9 %  bolus 1,000 mL (0 mLs Intravenous Stopped 12/27/19 1820)  dexamethasone (DECADRON) tablet 10 mg (10 mg Oral Given 12/27/19 2144)    ED Course  I have reviewed the triage vital signs and the nursing notes.  Pertinent labs & imaging results that were available during my care of the patient were reviewed by me and considered in my medical decision making (see chart for details).  Clinical Course as of Dec 26 2312  Tue Dec 27, 2019  1642 I discussed patient's case and  situation with the on-call oncologist, Dr. Quintella Reichert.  Apparently the patient is still being managed by oncology, with possible upcoming liver biopsy, which would proceed placement in hospice.   [EW]  1644 Urinalysis, Routine w reflex microscopic Nasopharyngeal Swab(!) [EW]  [place] has left to voicemails on the patient's husband phone, and he has not returned the call.  He cannot otherwise be located.   [EW]  1950 Patient's husband has now returned.  Apparently, police were able to locate him at his home, and encouraged him to return to the ED.  At this point he states "I cannot manage her at home because of her confusion, I cannot understand what she is saying."  I agreed to proceed with a head CT.  I asked him to stay in the ED until that returned.   [EW]  2141 I discussed the findings and plan with the patient's husband.  He is concerned that he does not have enough help at home.  We discussed that arrangements are being made but they have not been completed yet.  He has talked to Education officer, museum.  I have paged TOC.   [EW]  2223 She has been seen by [facility] who reinforced with the husband that home health services have been initiated.  TOC will reach out to oncology service to encourage them to follow through with plans for palliative consultation.   [EW]    Clinical Course User Index [EW] Daleen Bo, MD   MDM Rules/Calculators/A&P                           Patient Vitals for the past 24 hrs:  BP Temp Temp src Pulse Resp SpO2 Height Weight  12/27/19 2130 (!) 149/84 -- -- (!) 122 18 95 % -- --  12/27/19 2100 126/81 -- -- (!) 118 18 95 % -- --  12/27/19 1950 (!) 162/121 -- -- 100 (!) 31 94 % -- --  12/27/19 1904 (!) 164/107 -- -- (!) 115 20 97 % -- --  12/27/19 1800 136/85 -- -- -- 18 -- -- --  12/27/19 1730 (!) 157/90 -- -- (!) 115 17 96 % -- --  12/27/19 1700 (!) 155/111 -- -- (!) 115 19 97 % -- --  12/27/19 1645 (!) 129/116 -- -- (!) 116 (!) 22 98 % -- --  12/27/19 1630 (!) 147/112  98.3 F (36.8 C) Rectal (!) 115 20 94 % -- --  12/27/19 1600 (!) 128/104 -- -- (!) 120 (!) 27 100 % -- --  12/27/19 1530 136/90 -- -- (!) 39 20 (!) 79 % -- --  12/27/19 1500 120/80 -- -- (!) 119 (!) 21 99 % -- --  12/27/19 1422 -- -- -- -- -- -- 5\' 3"  (1.6 m) 72.6 kg  12/27/19 1421 -- 98 F (36.7 C) Temporal 94 (!) 40 (!) 86 % -- --    At time of discharge -reevaluation with update and discussion.  After initial assessment and treatment, an updated evaluation reveals no change in clinical status.  Findings discussed with patient's husband and all questions were. Daleen Bo   Medical Decision Making:  This patient is presenting for evaluation of confusion, which does require a range of treatment options, and is a complaint that involves a high risk of morbidity and mortality. The differential diagnoses include acute illnesses including UTI, metabolic disorder, progression of intracranial metastases from lung cancer. I decided to review old records, and in summary elderly female, living at home with family members here to be evaluated for confusion which is ongoing, and possibly worsening.  She is debilitated and is working on getting in-home health care set up.  She has not yet been placed into hospice care..  I obtained additional historical information from husband at the bedside.  Clinical Laboratory Tests Ordered, included CBC, Metabolic panel, Urinalysis and Covid testing. Review indicates mild metabolic abnormalities including potassium slightly low, CO2 low, glucose high, creatinine high, GFR low.  CBC with white count high 11.5, and hemoglobin low 9.4.  Neutrophil count elevated.     Critical Interventions-clinical evaluation, laboratory testing, CT imaging, observation reassessment.  Case discussed with oncology who recommends initiation of Decadron therapy, which was started in the emergency department.  Social work consultation for assistance with ongoing management and outpatient  support to help husband.  After These Interventions, the Patient was reevaluated and was found stable for discharge.  She has ongoing confusion, worsening with likely more edema secondary to brain metastases.  Patient's husband is having difficulty managing her at home, and home health services have been slow to start.  She is being actively followed and managed by oncology.  They have discussed getting a palliative care consult but that has not yet been done.  Social services will help arrange follow-up care.  Patient being discharged by EMS transport, back to her home  CRITICAL CARE-no Performed by: Daleen Bo  Nursing Notes Reviewed/ Care Coordinated Applicable Imaging Reviewed Interpretation of Laboratory Data incorporated into ED treatment  The patient appears reasonably screened and/or stabilized for discharge and I doubt any other medical condition or other [facility] requiring further screening, evaluation, or treatment in the ED at this time prior to discharge.  Plan: Home Medications-continue usual; Home Treatments-gradual advance diet and activity; return here if the recommended treatment, does not improve the symptoms; Recommended follow up-PCP, as needed, oncology follow-up as scheduled     Final Clinical Impression(s) / ED Diagnoses Final diagnoses:  Cerebral edema ([place])  Brain metastases ([place])  Confusion    Rx / DC Orders ED Discharge Orders         Ordered    dexamethasone (DECADRON) 4 MG tablet  4 times daily        12/27/19 2206           Daleen Bo, MD 12/27/19 2314

## 2019-12-27 NOTE — Clinical Social Work Note (Signed)
Transition of Care [facility]) - Emergency Department Mini Assessment   Patient Details  Name: Rose Lutz MRN: [id number] Date of Birth: Apr 30, 1950  Transition of Care [facility]) CM/SW Contact:    Iona Beard, [place] Phone Number: 12/27/2019, 10:32 PM   Clinical Narrative: TOC received consult for HH/DME needs. CSW visited pt and pts husband in ED to complete assessment. Per Mr. Trettin, he has been trying to get help to assist him. States that pts PCP and the cancer center have said they are working on it. Mr. Brevik states that a [place] was supposed to come out Friday but now they are coming Monday. Mr. Tzeng states that he can't do it on his own and would like [facility] services. He just wants pt to get strength back in her legs so that he can better help pt. CSW informed him that she will f/u with cancer center and PCP to find out what company [facility] they have set up. CSW also inquired about if anyone has spoken with Mr. Clites about outpatient palliative. CSW explained outpatient palliative to Mr. Golda. Per chart review, an outpatient palliative referral had been made by pts oncologist. TOC signing off.   ED Mini Assessment: What brought you to the Emergency Department? : Delusions  Barriers to Discharge: ED Barriers Resolved  Barrier interventions: HH has already been set up for pt.  Means of departure: Ambulance  Interventions which prevented an admission or readmission: [place] or Services    Patient Contact and Communications        ,              Choice offered to / list presented to : NA  Admission diagnosis:  AMS Patient Active Problem List   Diagnosis Date Noted  . Hypomagnesemia 12/01/2019  . Palliative care by specialist   . Encounter for hospice care discussion   . Altered mental status, unspecified 11/29/2019  . Acute lower UTI 11/29/2019  . UTI (urinary tract infection) 11/29/2019  . Genetic testing 09/06/2019  . Nonsquamous nonsmall cell neoplasm  of left lung ([place])   . Goals of care, counseling/discussion 08/31/2019  . Family history of pancreatic cancer   . Family history of breast cancer   . Family history of colon cancer   . Metastatic non-small cell lung cancer ([place]) 08/19/2019  . Chondrocalcinosis 02/16/2018  . History of total right knee replacement 09/22/2017  . Primary malignant neoplasm of left upper lobe of lung ([place]) 01/22/2017  . Family history of cancer 01/22/2017  . Mass of left lung 12/19/2016  . Rheumatoid arthritis involving multiple sites with positive rheumatoid factor ([place]) 05/06/2016  . High risk medications (not anticoagulants) long-term use 05/06/2016  . Primary osteoarthritis of both hands 05/06/2016  . Primary osteoarthritis of left knee 05/06/2016  . Pain in left ankle and joints of left foot 05/06/2016  . Other fatigue 05/06/2016  . Dizziness and giddiness 02/05/2015  . HTN (hypertension) 02/05/2015   PCP:  Jake Samples, PA-C Pharmacy:   [facility] [place], [place] AT [place] [address] [place] [address] Phone: [phone] Fax: [phone]

## 2019-12-27 NOTE — ED Triage Notes (Addendum)
Husband brought pt here due to being delusional, hx of same about a month ago.  Pt is a cancer (lung), pt receiving radiation and chemo here at Cancer center.  Started again this past Sunday.

## 2019-12-27 NOTE — ED Notes (Signed)
[employer] sent out to the house to make contact with pt's husband, EDP aware.

## 2019-12-27 NOTE — Discharge Instructions (Addendum)
We sent a prescription for Decadron, to your pharmacy.  Please have her start taking it tomorrow morning.  She needs to take it 4 times a day to help the swelling in the brain.  Call her oncologist for follow-up appointment as soon as possible.

## 2019-12-28 NOTE — ED Notes (Signed)
Pt cleaned and new linens and depend changed.

## 2020-01-02 DIAGNOSIS — C7931 Secondary malignant neoplasm of brain: Secondary | ICD-10-CM | POA: Diagnosis not present

## 2020-01-02 DIAGNOSIS — Z515 Encounter for palliative care: Secondary | ICD-10-CM | POA: Diagnosis not present

## 2020-01-02 DIAGNOSIS — C3490 Malignant neoplasm of unspecified part of unspecified bronchus or lung: Secondary | ICD-10-CM | POA: Diagnosis not present

## 2020-01-02 DIAGNOSIS — C787 Secondary malignant neoplasm of liver and intrahepatic bile duct: Secondary | ICD-10-CM | POA: Diagnosis not present

## 2020-01-05 ENCOUNTER — Telehealth (HOSPITAL_COMMUNITY): Payer: Self-pay

## 2020-01-05 NOTE — Telephone Encounter (Signed)
This nurse called and spoke with patient and informed her that [facility] will be calling to set up an appointment to come and assess her in order to resume her palliative care. No questions or concerns at this time.

## 2020-01-21 DIAGNOSIS — C3490 Malignant neoplasm of unspecified part of unspecified bronchus or lung: Secondary | ICD-10-CM | POA: Diagnosis not present

## 2020-01-24 ENCOUNTER — Other Ambulatory Visit: Payer: Self-pay

## 2020-01-24 ENCOUNTER — Inpatient Hospital Stay (HOSPITAL_COMMUNITY): Payer: Medicare Other | Attending: Hematology

## 2020-01-24 DIAGNOSIS — C3490 Malignant neoplasm of unspecified part of unspecified bronchus or lung: Secondary | ICD-10-CM

## 2020-01-24 DIAGNOSIS — Z79899 Other long term (current) drug therapy: Secondary | ICD-10-CM | POA: Insufficient documentation

## 2020-01-24 DIAGNOSIS — C7931 Secondary malignant neoplasm of brain: Secondary | ICD-10-CM | POA: Insufficient documentation

## 2020-01-24 DIAGNOSIS — C787 Secondary malignant neoplasm of liver and intrahepatic bile duct: Secondary | ICD-10-CM | POA: Insufficient documentation

## 2020-01-24 DIAGNOSIS — Z87891 Personal history of nicotine dependence: Secondary | ICD-10-CM | POA: Diagnosis not present

## 2020-01-24 DIAGNOSIS — M069 Rheumatoid arthritis, unspecified: Secondary | ICD-10-CM | POA: Diagnosis not present

## 2020-01-24 DIAGNOSIS — K5900 Constipation, unspecified: Secondary | ICD-10-CM | POA: Insufficient documentation

## 2020-01-24 DIAGNOSIS — Z993 Dependence on wheelchair: Secondary | ICD-10-CM | POA: Diagnosis not present

## 2020-01-24 DIAGNOSIS — C3412 Malignant neoplasm of upper lobe, left bronchus or lung: Secondary | ICD-10-CM | POA: Insufficient documentation

## 2020-01-24 LAB — COMPREHENSIVE METABOLIC PANEL
ALT: 38 U/L (ref 0–44)
AST: 50 U/L — ABNORMAL HIGH (ref 15–41)
Albumin: 3 g/dL — ABNORMAL LOW (ref 3.5–5.0)
Alkaline Phosphatase: 128 U/L — ABNORMAL HIGH (ref 38–126)
Anion gap: 11 (ref 5–15)
BUN: 26 mg/dL — ABNORMAL HIGH (ref 8–23)
CO2: 25 mmol/L (ref 22–32)
Calcium: 8.6 mg/dL — ABNORMAL LOW (ref 8.9–10.3)
Chloride: 100 mmol/L (ref 98–111)
Creatinine, Ser: 1.03 mg/dL — ABNORMAL HIGH (ref 0.44–1.00)
GFR, Estimated: 58 mL/min — ABNORMAL LOW (ref >60)
Glucose, Bld: 99 mg/dL (ref 70–99)
Potassium: 4.1 mmol/L (ref 3.5–5.1)
Sodium: 136 mmol/L (ref 135–145)
Total Bilirubin: 0.4 mg/dL (ref 0.3–1.2)
Total Protein: 6.5 g/dL (ref 6.5–8.1)

## 2020-01-24 LAB — CBC WITH DIFFERENTIAL/PLATELET
Abs Immature Granulocytes: 0.14 10*3/uL — ABNORMAL HIGH (ref 0.00–0.07)
Basophils Absolute: 0 10*3/uL (ref 0.0–0.1)
Basophils Relative: 0 %
Eosinophils Absolute: 0 10*3/uL (ref 0.0–0.5)
Eosinophils Relative: 0 %
HCT: 31.1 % — ABNORMAL LOW (ref 36.0–46.0)
Hemoglobin: 9.4 g/dL — ABNORMAL LOW (ref 12.0–15.0)
Immature Granulocytes: 2 %
Lymphocytes Relative: 8 %
Lymphs Abs: 0.6 10*3/uL — ABNORMAL LOW (ref 0.7–4.0)
MCH: 33.9 pg (ref 26.0–34.0)
MCHC: 30.2 g/dL (ref 30.0–36.0)
MCV: 112.3 fL — ABNORMAL HIGH (ref 80.0–100.0)
Monocytes Absolute: 0.2 10*3/uL (ref 0.1–1.0)
Monocytes Relative: 3 %
Neutro Abs: 6.5 10*3/uL (ref 1.7–7.7)
Neutrophils Relative %: 87 %
Platelets: 88 10*3/uL — ABNORMAL LOW (ref 150–400)
RBC: 2.77 MIL/uL — ABNORMAL LOW (ref 3.87–5.11)
RDW: 15.9 % — ABNORMAL HIGH (ref 11.5–15.5)
WBC: 7.5 10*3/uL (ref 4.0–10.5)
nRBC: 0 % (ref 0.0–0.2)

## 2020-01-24 LAB — MAGNESIUM: Magnesium: 1.6 mg/dL — ABNORMAL LOW (ref 1.7–2.4)

## 2020-01-24 LAB — VITAMIN B12: Vitamin B-12: 268 pg/mL (ref 180–914)

## 2020-01-25 ENCOUNTER — Other Ambulatory Visit (HOSPITAL_COMMUNITY): Payer: Self-pay

## 2020-01-25 DIAGNOSIS — C7931 Secondary malignant neoplasm of brain: Secondary | ICD-10-CM | POA: Diagnosis not present

## 2020-01-25 DIAGNOSIS — I10 Essential (primary) hypertension: Secondary | ICD-10-CM | POA: Diagnosis not present

## 2020-01-25 DIAGNOSIS — C3490 Malignant neoplasm of unspecified part of unspecified bronchus or lung: Secondary | ICD-10-CM | POA: Diagnosis not present

## 2020-01-25 DIAGNOSIS — C787 Secondary malignant neoplasm of liver and intrahepatic bile duct: Secondary | ICD-10-CM | POA: Diagnosis not present

## 2020-01-25 DIAGNOSIS — Z515 Encounter for palliative care: Secondary | ICD-10-CM | POA: Diagnosis not present

## 2020-01-25 LAB — FOLATE: Folate: 67.1 ng/mL (ref >5.9)

## 2020-01-31 ENCOUNTER — Other Ambulatory Visit: Payer: Self-pay

## 2020-01-31 ENCOUNTER — Inpatient Hospital Stay (HOSPITAL_BASED_OUTPATIENT_CLINIC_OR_DEPARTMENT_OTHER): Payer: Medicare Other | Admitting: Oncology

## 2020-01-31 VITALS — BP 157/92 | HR 110 | Temp 97.0°F | Resp 17

## 2020-01-31 DIAGNOSIS — C3490 Malignant neoplasm of unspecified part of unspecified bronchus or lung: Secondary | ICD-10-CM

## 2020-01-31 DIAGNOSIS — C7931 Secondary malignant neoplasm of brain: Secondary | ICD-10-CM | POA: Diagnosis not present

## 2020-01-31 DIAGNOSIS — M069 Rheumatoid arthritis, unspecified: Secondary | ICD-10-CM | POA: Diagnosis not present

## 2020-01-31 DIAGNOSIS — K5900 Constipation, unspecified: Secondary | ICD-10-CM | POA: Diagnosis not present

## 2020-01-31 DIAGNOSIS — C787 Secondary malignant neoplasm of liver and intrahepatic bile duct: Secondary | ICD-10-CM | POA: Diagnosis not present

## 2020-01-31 DIAGNOSIS — Z87891 Personal history of nicotine dependence: Secondary | ICD-10-CM | POA: Diagnosis not present

## 2020-01-31 DIAGNOSIS — Z79899 Other long term (current) drug therapy: Secondary | ICD-10-CM | POA: Diagnosis not present

## 2020-01-31 DIAGNOSIS — C3412 Malignant neoplasm of upper lobe, left bronchus or lung: Secondary | ICD-10-CM | POA: Diagnosis not present

## 2020-01-31 DIAGNOSIS — Z993 Dependence on wheelchair: Secondary | ICD-10-CM | POA: Diagnosis not present

## 2020-01-31 NOTE — Progress Notes (Signed)
[place], [place] [address]   CLINIC:  Medical Oncology/Hematology  PCP:  Scherrie Bateman [address] / [place] [address] [phone]   REASON FOR VISIT:  Follow-up for metastatic bilateral non-small cell lung cancer to liver and brain  PRIOR THERAPY:  1. Platinum and pemetrexed x 4 cycles from 04/02/2017 to 06/08/2017. 2. Left upper lobectomy on 07/08/2017. 3. Carbo/Pem/Pembro x 4 cycles from 09/20/2018 to 12/06/2018. 4. Pembrolizumab maintenance from 01/03/2019 to 04/25/2019. 5. Pemetrexed maintenance x 6 cycles from 06/27/2019 to 11/09/2019.  NGS Results: CFTR carrier  CURRENT THERAPY: Observation  BRIEF ONCOLOGIC HISTORY:  Oncology History  Primary malignant neoplasm of left upper lobe of lung ([place])  01/22/2017 Initial Diagnosis   Primary malignant neoplasm of left upper lobe of lung ([place])   09/05/2019 Genetic Testing   CFTR c.[id number][12]T[5] (Intronic) single pathogenic variant identified on a 64 gene custom panel.  The patient is a carrier for Cystic Fibrosis.  This does not change medical management and is not thought to be the cause of her cancer or that in the family. The Custom Hereditary Gene Panel offered by [facility] includes sequencing and/or deletion duplication testing of the following 64 genes: APC*, ATM*, AXIN2, BARD1, BLM, BMPR1A, BRCA1, BRCA2, BRIP1, BUB1B, CASR, CDH1, CDK4, CDKN2A (p14ARF), CDKN2A (p16INK4a), CEP57*, CFTR*, CHEK2, CPA1, CTNNA1, CTRC, DICER1*, ENG*, EPCAM*, FANCC, FLCN, GALNT12, GREM1*, HOXB13, KIT, MEN1*, MLH1*, MLH3*, MSH2*, MSH3*, MSH6*, MUTYH, NBN, NF1*, NTHL1, PALB2, PALLD, PDGFRA, PMS2*, POLD1*, POLE, PRSS1*, PTEN*, RAD50, RAD51C, RAD51D, RNF43, RPS20, SDHA*, SDHB, SDHC*, SDHD, SMAD4, SMARCA4, SPINK1, STK11, TP53, TSC1*, TSC2, and VHL.  The report date is September 05, 2019.   Metastatic non-small cell lung cancer ([place])  08/19/2019 Initial Diagnosis   Metastatic non-small cell lung  cancer ([place])   09/05/2019 -  Chemotherapy   The patient had ondansetron (ZOFRAN) injection 8 mg, 8 mg (100 % of original dose 8 mg), Intravenous,  Once, 1 of 1 cycle Dose modification: 8 mg (original dose 8 mg, Cycle 1) ondansetron (ZOFRAN) 8 mg in sodium chloride 0.9 % 50 mL IVPB, , Intravenous,  Once, 4 of 5 cycles Administration: 8 mg (09/05/2019), 8 mg (09/27/2019), 8 mg (10/19/2019), 8 mg (11/09/2019) PEMEtrexed (ALIMTA) 1,000 mg in sodium chloride 0.9 % 100 mL chemo infusion, 500 mg/m2 = 1,000 mg, Intravenous,  Once, 4 of 5 cycles Administration: 1,000 mg (09/05/2019), 1,000 mg (09/27/2019), 1,000 mg (10/19/2019), 1,000 mg (11/09/2019) fosaprepitant (EMEND) 150 mg in sodium chloride 0.9 % 145 mL IVPB, 150 mg, Intravenous,  Once, 1 of 2 cycles Administration: 150 mg (11/09/2019)  for chemotherapy treatment.      CANCER STAGING: Cancer Staging Primary malignant neoplasm of left upper lobe of lung ([place]) Staging form: Lung, AJCC 8th Edition - Clinical stage from 01/22/2017: Stage IIIA (cT4, cN0, cM0) - Unsigned   INTERVAL HISTORY:  [name], a 70 y.o. female, returns for routine follow-up of her metastatic bilateral non-small cell lung cancer to liver and brain. Lakrista was last seen on 11/09/2019.   In the interim, she was hospitalized 38/1/82-99/37/16 for metabolic encephalopathy.  She was found to have a UTI and to be dehydrated.  She also had some severe electrolyte derangements.  She recovered and they recommended SNF.  Patient declined and she was discharged home with husband.  She was evaluated on 12/27/2019 for AMS.  Work-up included imaging which showed possibly worsening of her brain metastasis.  She was started on Decadron.  Hospice-palliative care was initiated on 01/05/2020.  Today patient is alone.  She reports feeling better since discharge from the hospital.  She self stopped her steroids secondary to insomnia and "feeling poorly".  She denies any additional  confusion or ams.  Her pain is well controlled.  She is very unsteady on her feet but able to bear weight.  She uses a wheelchair for long distances and is able to use a walker or a cane at home.  She is interested in home health physical therapy and Occupational Therapy.  States she thought this was already arranged prior to discharge from the hospital but she has not seen anybody.  She is here to discuss recent imaging.   REVIEW OF SYSTEMS:  Review of Systems  Constitutional: Positive for fatigue. Negative for appetite change, fever and unexpected weight change.  HENT:   Negative for nosebleeds, sore throat and trouble swallowing.   Eyes: Negative.   Respiratory: Negative.  Negative for cough, shortness of breath and wheezing.   Cardiovascular: Positive for chest pain. Negative for leg swelling.  Gastrointestinal: Negative for abdominal pain, blood in stool, constipation, diarrhea, nausea and vomiting.  Endocrine: Negative.   Genitourinary: Negative.  Negative for bladder incontinence, hematuria and nocturia.   Musculoskeletal: Negative.  Negative for back pain and flank pain.  Skin: Negative.   Neurological: Positive for light-headedness. Negative for dizziness, headaches and numbness.  Hematological: Negative.   Psychiatric/Behavioral: Positive for depression. Negative for confusion. The patient is nervous/anxious.     PAST MEDICAL/SURGICAL HISTORY:  Past Medical History:  Diagnosis Date  . Chronic pain   . Depression   . Essential hypertension   . Family history of breast cancer   . Family history of colon cancer   . Family history of pancreatic cancer   . GERD (gastroesophageal reflux disease)   . History of lung cancer 06/2017  . Osteoarthritis   . Pulmonary asbestosis ([place])   . Rheumatoid arthritis [facility])    Past Surgical History:  Procedure Laterality Date  . ABDOMINAL HYSTERECTOMY    . BILATERAL OOPHORECTOMY    . CESAREAN SECTION     x3  . FEMUR FRACTURE SURGERY     . KNEE ARTHROPLASTY Right   . LUNG CANCER SURGERY Left 07/08/2017  . pelvis fracture/surgery     Dr. Cheri Rous  . PORTACATH PLACEMENT Right 09/05/2019   Procedure: INSERTION PORT-A-CATH;  Surgeon: Aviva Signs, MD;  Location: [facility];  Service: General;  Laterality: Right;  . TONSILLECTOMY    . VIDEO BRONCHOSCOPY WITH ENDOBRONCHIAL NAVIGATION Left 12/31/2016   Procedure: VIDEO BRONCHOSCOPY WITH ENDOBRONCHIAL NAVIGATION, left upper lung lobe;  Surgeon: Collene Gobble, MD;  Location: [facility];  Service: Thoracic;  Laterality: Left;  . YAG LASER APPLICATION Right 26/71/2458   Procedure: YAG LASER APPLICATION;  Surgeon: Rutherford Guys, MD;  Location: [facility];  Service: Ophthalmology;  Laterality: Right;  . YAG LASER APPLICATION Left 09/98/3382   Procedure: YAG LASER APPLICATION;  Surgeon: Rutherford Guys, MD;  Location: [facility];  Service: Ophthalmology;  Laterality: Left;    SOCIAL HISTORY:  Social History   Socioeconomic History  . Marital status: Married    Spouse name: Not on file  . Number of children: 3  . Years of education: Not on file  . Highest education level: Not on file  Occupational History  . Occupation: DISABLED  Tobacco Use  . Smoking status: Former Smoker    Packs/day: 1.25    Years: 38.00    Pack  years: 47.50    Types: Cigarettes    Start date: 01/21/1968    Quit date: 01/20/2006    Years since quitting: 14.0  . Smokeless tobacco: Never Used  Vaping Use  . Vaping Use: Never used  Substance and Sexual Activity  . Alcohol use: No    Alcohol/week: 0.0 standard drinks  . Drug use: No  . Sexual activity: Not Currently  Other Topics Concern  . Not on file  Social History Narrative  . Not on file   Social Determinants of Health   Financial Resource Strain: Medium Risk  . Difficulty of Paying Living Expenses: Somewhat hard  Food Insecurity: No Food Insecurity  . Worried About Charity fundraiser in the Last Year: Never true  . Ran Out of Food in the Last Year: Never true   Transportation Needs: No Transportation Needs  . Lack of Transportation (Medical): No  . Lack of Transportation (Non-Medical): No  Physical Activity: Inactive  . Days of Exercise per Week: 0 days  . Minutes of Exercise per Session: 0 min  Stress: Stress Concern Present  . Feeling of Stress : To some extent  Social Connections: Moderately Isolated  . Frequency of Communication with Friends and Family: Twice a week  . Frequency of Social Gatherings with Friends and Family: Never  . Attends Religious Services: 1 to 4 times per year  . Active Member of Clubs or Organizations: No  . Attends Archivist Meetings: Never  . Marital Status: Married  Human resources officer Violence: Not At Risk  . Fear of Current or Ex-Partner: No  . Emotionally Abused: No  . Physically Abused: No  . Sexually Abused: No    FAMILY HISTORY:  Family History  Problem Relation Age of Onset  . Breast cancer Mother 65       d. 45  . Colon cancer Maternal Grandmother 18       d. 28  . Pancreatic cancer Son 34  . Healthy Father   . Arthritis Sister   . Arthritis Paternal Grandmother   . Other Son 52       MVA  . Esophageal cancer Maternal Aunt     CURRENT MEDICATIONS:  Current Outpatient Medications  Medication Sig Dispense Refill  . alendronate (FOSAMAX) 35 MG tablet Take 35 mg by mouth every 7 (seven) days.     Marland Kitchen amitriptyline (ELAVIL) 100 MG tablet Take 100 mg by mouth at bedtime.    . Ascorbic Acid (VITAMIN C) 250 MG CHEW Chew 250 mg by mouth daily.     . cephALEXin (KEFLEX) 500 MG capsule Take 500 mg by mouth 4 (four) times daily.    . cetirizine (ZYRTEC) 10 MG tablet Take 10 mg by mouth daily as needed for allergies.     Marland Kitchen dexamethasone (DECADRON) 4 MG tablet Take 1 tablet (4 mg total) by mouth 4 (four) times daily. 120 tablet 0  . diphenhydrAMINE (BENADRYL) 25 mg capsule Take 25 mg by mouth every 6 (six) hours as needed for itching or allergies.     Marland Kitchen gabapentin (NEURONTIN) 100 MG capsule  Take 100 mg by mouth 3 (three) times daily.   5  . hydrALAZINE (APRESOLINE) 25 MG tablet Take 25 mg by mouth daily.     Marland Kitchen HYDROcodone-acetaminophen (NORCO) 10-325 MG tablet Take 2 tablets by mouth every 6 (six) hours as needed for severe pain.     . hydroxychloroquine (PLAQUENIL) 200 MG tablet Take 2 tablets (400 mg total) by mouth  every Monday, Tuesday, Wednesday, Thursday, and Friday.    . leflunomide (ARAVA) 10 MG tablet Take 2 tablets (20 mg total) by mouth daily.    Marland Kitchen losartan (COZAAR) 25 MG tablet Take 25 mg by mouth daily.    Marland Kitchen losartan-hydrochlorothiazide (HYZAAR) 50-12.5 MG tablet Take 1 tablet by mouth daily.    . Misc. Devices MISC Please provide patient with oxygen via nasal cannula at 2 liters per minute continuous.  Please provide tubing for oxygen delivery. 1 each 99  . Multiple Vitamin (MULTIVITAMIN WITH MINERALS) TABS tablet Take 1 tablet by mouth daily.    . mupirocin ointment (BACTROBAN) 2 % Apply 1 application topically See admin instructions. 2 to 3 times daily    . omeprazole (PRILOSEC) 20 MG capsule Take 20 mg by mouth daily.    . ondansetron (ZOFRAN-ODT) 4 MG disintegrating tablet Take 4 mg by mouth every 8 (eight) hours as needed for nausea or vomiting.     . Oxycodone HCl 10 MG TABS Take 10 mg by mouth 4 (four) times daily as needed.    . potassium chloride (MICRO-K) 10 MEQ CR capsule Take 10 mEq by mouth 2 (two) times daily.     . prochlorperazine (COMPAZINE) 10 MG tablet Take 10 mg by mouth every 6 (six) hours as needed for nausea or vomiting.     . senna (SENOKOT) 8.6 MG tablet Take 1 tablet by mouth at bedtime.     . valACYclovir (VALTREX) 1000 MG tablet Take 1,000 mg by mouth 3 (three) times daily.    . Vitamin D, Ergocalciferol, (DRISDOL) 50000 units CAPS capsule Take 1 capsule (50,000 Units total) by mouth every 7 (seven) days. 12 capsule 0  . folic acid (FOLVITE) 1 MG tablet Take 1 tablet (1 mg total) by mouth daily. 90 tablet 4   No current facility-administered  medications for this visit.    ALLERGIES:  Allergies  Allergen Reactions  . Oxycontin [Oxycodone] Other (See Comments)    Hallucinations   . Quinine Derivatives Nausea And Vomiting  . Sulfa Antibiotics Nausea And Vomiting  . Barium-Containing Compounds   . Percocet [Oxycodone-Acetaminophen] Other (See Comments)    Headahces    PHYSICAL EXAM:  Performance status (ECOG): 2 - Symptomatic, <50% confined to bed  Vitals:   01/31/20 1435  BP: (!) 157/92  Pulse: (!) 110  Resp: 17  Temp: (!) 97 F (36.1 C)  SpO2: 100%   Wt Readings from Last 3 Encounters:  12/27/19 160 lb (72.6 kg)  12/06/19 168 lb 1.6 oz (76.2 kg)  11/29/19 167 lb 8.8 oz (76 kg)   Physical Exam Constitutional:      General: Vital signs are normal.     Appearance: Normal appearance.  HENT:     Head: Normocephalic and atraumatic.  Eyes:     Pupils: Pupils are equal, round, and reactive to light.  Cardiovascular:     Rate and Rhythm: Normal rate and regular rhythm.     Heart sounds: Normal heart sounds. No murmur heard.   Pulmonary:     Effort: Pulmonary effort is normal.     Breath sounds: Normal breath sounds. No wheezing.  Abdominal:     General: Bowel sounds are normal. There is no distension.     Palpations: Abdomen is soft.     Tenderness: There is no abdominal tenderness.  Musculoskeletal:        General: No edema. Normal range of motion.     Cervical back: Normal range of motion.  Skin:    General: Skin is warm and dry.     Findings: No rash.  Neurological:     Mental Status: She is alert and oriented to person, place, and time.  Psychiatric:        Judgment: Judgment normal.      LABORATORY DATA:  I have reviewed the labs as listed.  CBC Latest Ref Rng & Units 01/24/2020 12/27/2019 12/01/2019  WBC 4.0 - 10.5 K/uL 7.5 11.5(H) 8.2  Hemoglobin 12.0 - 15.0 g/dL 9.4(L) 9.4(L) 8.6(L)  Hematocrit 36.0 - 46.0 % 31.1(L) 30.0(L) 28.2(L)  Platelets 150 - 400 K/uL 88(L) 242 277   CMP Latest Ref  Rng & Units 01/24/2020 12/27/2019 12/01/2019  Glucose 70 - 99 mg/dL 99 134(H) 95  BUN 8 - 23 mg/dL 26(H) 15 13  Creatinine 0.44 - 1.00 mg/dL 1.03(H) 1.30(H) 1.22(H)  Sodium 135 - 145 mmol/L 136 135 137  Potassium 3.5 - 5.1 mmol/L 4.1 3.4(L) 3.7  Chloride 98 - 111 mmol/L 100 100 105  CO2 22 - 32 mmol/L 25 20(L) 22  Calcium 8.9 - 10.3 mg/dL 8.6(L) 9.3 8.5(L)  Total Protein 6.5 - 8.1 g/dL 6.5 - 6.4(L)  Total Bilirubin 0.3 - 1.2 mg/dL 0.4 - 0.5  Alkaline Phos 38 - 126 U/L 128(H) - 84  AST 15 - 41 U/L 50(H) - 39  ALT 0 - 44 U/L 38 - 20    DIAGNOSTIC IMAGING:  I have independently reviewed the scans and discussed with the patient. No results found.   ASSESSMENT:  1. Metastatic non-small cell lung cancer: -History of stage IIIa (T3 N1 M0) non-small cell lung cancer presented with 2 masses in the left upper lobe as well as questionable left hilar adenopathy diagnosed in December 2018. -4 cycles of platinum and pemetrexed from 04/02/2017 through 06/08/2017 -Left upper lobectomy with en blocLLLsuperior segmentectomy on 07/08/2017, pathology showing invasive adenocarcinoma, moderately to poorly differentiated with focal treatment effect and necrosis, 4.3 cm (mass #1) and invasive squamous cell carcinoma, moderately to poorly differentiated, 3.1 cm (mass #2), margins negative, YPT3Y PN 1. -PET scan on 08/04/2018 showed hypermetabolic right apical and retroaortic postradiation changes, intensely hypermetabolic subpleural spiculated nodules/correlating mass in the superior segment of the left lower lobe suspicious for locoregional disease recurrence. Small left pleural effusion demonstrated mild FDG activity. Right hepatic lobe segment 5 hypodensity with intense hypermetabolic activity. Hypermetabolic and markedly enlarged porta hepatis lymph nodes intimately related to the caudate lobe. -MRI of the brain on 09/16/2018 shows 2 ring-enhancing lesions in the parasagittal right frontal lobe and long the left  parieto-occipital sulcus measuring 1.7 and 1.3 cm respectively. -4 cycles of carbo/Pem/Pembro from 09/20/2018 through 12/06/2018 -Pembrolizumab maintenance from 01/03/2019 through 04/25/2019, with flare of rheumatoid arthritis. -Started on Remicade on 05/27/2019. -She was switched to pemetrexed maintenance on 06/27/2019. -She moved [facility]. She was due for her pemetrexed on 08/08/2019. -She walks with the help of her walker and does very minimal household work. She needs assistance taking shower and clothing herself. -She has tolerated pemetrexed except some tiredness and decreased appetite lasting 2 to 3 days after each treatment. -CT CAP on 09/02/2019 shows masslike consolidation in the left lower lobe measuring 5.3 x 2.2 cm. Right hepatic lobe lesion measures 5.7 x 4.6 cm. Additional 1.6 x 1.3 low-attenuation lesion medial posterior right hepatic lobe. Enlarged porta hepatic and preaortic lymph nodes. Left hemisacral insufficiency fracture. Fracture through the left superior and inferior pubic rami. Wedge compression deformity of T4 vertebral body.  2.  Brain metastasis: -Underwent SRS to the brain meta stasis in the left temporal lobe, right frontal and left frontal lobes. -Last MRI of the brain was on 06/15/2019. -Brain MRI on 10/11/2019 shows 1.7 x 1.5 cm inferior right frontal lesion, 1.5 x 1.3 cm medial left occipital lesion, 5 mm inferior right frontal lesion, 5 mm left occipital lesion, 3 mm left frontal operculum lesion. -I have compared them with MRI from [place]. All 5 lesions were present previously and the first 2 lesions have significantly decreased in size.  3. Family history: -Maternal grandmother had colon cancer. -Maternal aunt had throat cancer. -Mother had breast cancer. -Oldest son died of pancreatic cancer.   PLAN:  1. Metastatic non-small cell lung cancer: -She has completed 6 cycles of pemetrexed-last given on 11/09/19 -Treatment on hold secondary to  several hospitalizations and restaging scans - [facility] hospitalization 02/21/1171-56/70/1410 for metabolic encephalopathy and treated for UTI and electrolyte derangement. -[place] hospitalization 12/27/2019-12/28/2019 for altered mental status-head CT showed vasogenic edema slightly worse than previous.  She was started on Decadron. -Restaging CT CAP from 12/20/2019 shows progressive disease in the liver and upper abdominal retroperitoneal lymphadenopathy consistent with metastatic disease.  Improvement of left lower lobe lesion. -We reviewed all of her imaging together and discussed findings consistent with overall progression of disease although some improvement was noted in the lung.    -She initially had discussed hospice with her husband but is interested in pursuing additional treatment if available.  We did discuss her recent hospitalizations and declining performance status which may affect her ability to receive treatment.  She understands but feels that she is stronger now.  -Recommend she RTC in 1 week or so to discuss with [name].  2. Brain metastasis: -Brain MRI on 10/11/2019 with no evidence of progression. -Repeat CT of her head on 12/27/2019 d/t AMS revealed vasogenic edema which appeared mildly worse from previous. -She is status post brain radiation.  -She is currently not on Decadron secondary to intolerance.  No evidence of confusion at this time.  3. Family history: -[facility] testing showed CFTR heterozygosity.  4. Rheumatoid arthritis: -Remicade is not needed as she is receiving chemotherapy.  5. Constipation: -Continue lactulose as needed.  6.  Hypomagnesemia: -Her magnesium level today is 1.6.  Disposition: -Return to clinic in 1 to 2 weeks to discuss additional options with [name]  Greater than 50% was spent in counseling and coordination of care with this patient including but not limited to discussion of the relevant topics above (See A&P)  including, but not limited to diagnosis and management of acute and chronic medical conditions.   Orders placed this encounter:  No orders of the defined types were placed in this encounter.  Faythe Casa, NP 02/01/2020 5:23 AM  [place] [phone]

## 2020-02-02 DIAGNOSIS — I10 Essential (primary) hypertension: Secondary | ICD-10-CM | POA: Diagnosis not present

## 2020-02-02 DIAGNOSIS — M069 Rheumatoid arthritis, unspecified: Secondary | ICD-10-CM | POA: Diagnosis not present

## 2020-02-02 DIAGNOSIS — C3492 Malignant neoplasm of unspecified part of left bronchus or lung: Secondary | ICD-10-CM | POA: Diagnosis not present

## 2020-02-02 DIAGNOSIS — Z681 Body mass index (BMI) 19 or less, adult: Secondary | ICD-10-CM | POA: Diagnosis not present

## 2020-02-03 ENCOUNTER — Other Ambulatory Visit (HOSPITAL_COMMUNITY): Payer: Self-pay | Admitting: *Deleted

## 2020-02-03 DIAGNOSIS — C3490 Malignant neoplasm of unspecified part of unspecified bronchus or lung: Secondary | ICD-10-CM

## 2020-02-03 NOTE — Progress Notes (Signed)
Orders placed for home health for RN, OT, PT, aide and social work per provider orders.  Faxed to [facility], awaiting acceptance.

## 2020-02-08 ENCOUNTER — Encounter (HOSPITAL_COMMUNITY): Payer: Self-pay

## 2020-02-08 NOTE — Progress Notes (Signed)
Office Visit Note  Patient: Rose Lutz             Date of Birth: 09/22/1950           MRN: [id number]             PCP: Jake Samples, PA-C Referring: Jake Samples, [place]* Visit Date: 02/22/2020 Occupation: @GUAROCC @  Subjective:  Medication Management   History of Present Illness: Rose Lutz is a 70 y.o. female with history of rheumatoid arthritis.  She has been diagnosed with metastatic small cell lung cancer.  Patient states that she has been taking Arava and Plaquenil on a regular basis.  She also just finished chemotherapy.  She is not sure about future chemotherapy.  She denies any joint pain or joint swelling.  She states she is very tired and deconditioned.  She was in a wheelchair.  Activities of Daily Living:  Patient reports morning stiffness for 0 minutes.   Patient Denies nocturnal pain.  Difficulty dressing/grooming: Reports Difficulty climbing stairs: Reports Difficulty getting out of chair: Reports Difficulty using hands for taps, buttons, cutlery, and/or writing: Reports  Review of Systems  Constitutional: Positive for fatigue.  HENT: Positive for mouth dryness and nose dryness. Negative for mouth sores.   Eyes: Negative for pain, itching and dryness.  Respiratory: Positive for shortness of breath, wheezing and difficulty breathing.   Cardiovascular: Negative for chest pain and palpitations.  Gastrointestinal: Negative for blood in stool, constipation and diarrhea.  Endocrine: Negative for increased urination.  Genitourinary: Negative for difficulty urinating.  Musculoskeletal: Negative for arthralgias, joint pain, joint swelling, myalgias, morning stiffness, muscle tenderness and myalgias.  Skin: Negative for color change, rash and redness.  Allergic/Immunologic: Positive for susceptible to infections.  Neurological: Positive for dizziness, headaches, memory loss and weakness. Negative for numbness.  Hematological: Positive for  bruising/bleeding tendency.  Psychiatric/Behavioral: Positive for confusion.    PMFS History:  Patient Active Problem List   Diagnosis Date Noted  . Palliative care by specialist   . Encounter for hospice care discussion   . Altered mental status, unspecified 11/29/2019  . Acute lower UTI 11/29/2019  . UTI (urinary tract infection) 11/29/2019  . Genetic testing 09/06/2019  . Nonsquamous nonsmall cell neoplasm of left lung ([place])   . Goals of care, counseling/discussion 08/31/2019  . Family history of pancreatic cancer   . Family history of breast cancer   . Family history of colon cancer   . Bone metastases ([place]) 06/06/2019  . Closed fracture of left superior rim of pubis ([place]) 03/20/2019  . Antineoplastic chemotherapy induced pancytopenia ([place]) 11/01/2018  . Hypomagnesemia 09/22/2018  . Chemotherapy-induced neuropathy ([place]) 03/10/2018  . Chronic pain 03/10/2018  . Closed hip fracture requiring operative repair with routine healing 03/10/2018  . Depression 03/10/2018  . Osteoarthritis 03/10/2018  . Chondrocalcinosis 02/16/2018  . History of total right knee replacement 09/22/2017  . Lung cancer ([place]) 07/08/2017  . Hemolytic anemia ([place]) 06/11/2017  . Lung cancer, main bronchus, unspecified laterality ([place]) 03/30/2017  . Primary malignant neoplasm of left upper lobe of lung ([place]) 01/22/2017  . Family history of cancer 01/22/2017  . High risk medications (not anticoagulants) long-term use 05/06/2016  . Primary osteoarthritis of both hands 05/06/2016  . Primary osteoarthritis of left knee 05/06/2016  . Pain in left ankle and joints of left foot 05/06/2016  . Other fatigue 05/06/2016  . Dizziness and giddiness 02/05/2015  . Chest pain 02/04/2012  . COPD (chronic obstructive pulmonary disease) ([place])  02/04/2012  . Major depressive disorder, recurrent episode ([place]) 03/12/2011  . Chronic kidney disease, stage 3 unspecified ([place]) 02/26/2011  . Other nonspecific abnormal finding of  lung field 11/09/2010  . Personal history of nicotine dependence 09/18/2010  . Acquired absence of both cervix and uterus 07/26/2010  . Pseudophakia 02/28/2009  . Pulmonary nodule 08/12/2007  . GERD (gastroesophageal reflux disease) 08/12/2007  . Kidney disease 08/12/2007  . Hyperlipidemia 08/12/2007  . Nontoxic multinodular goiter 08/12/2007  . PPD positive 08/12/2007  . Hx of knee surgery 03/23/2006  . Rheumatoid arthritis ([place]) 07/01/2004  . HTN (hypertension) 08/07/2003  . Osteoporosis 08/07/2003    Past Medical History:  Diagnosis Date  . Chronic pain   . Depression   . Essential hypertension   . Family history of breast cancer   . Family history of colon cancer   . Family history of pancreatic cancer   . GERD (gastroesophageal reflux disease)   . History of lung cancer 06/2017  . Osteoarthritis   . Pulmonary asbestosis ([place])   . Rheumatoid arthritis ([place])     Family History  Problem Relation Age of Onset  . Breast cancer Mother 52       d. 62  . Colon cancer Maternal Grandmother 1       d. 28  . Pancreatic cancer Son 7  . Healthy Father   . Arthritis Sister   . Arthritis Paternal Grandmother   . Other Son 6       MVA  . Esophageal cancer Maternal Aunt    Past Surgical History:  Procedure Laterality Date  . ABDOMINAL HYSTERECTOMY    . BILATERAL OOPHORECTOMY    . CESAREAN SECTION     x3  . FEMUR FRACTURE SURGERY    . KNEE ARTHROPLASTY Right   . LUNG CANCER SURGERY Left 07/08/2017  . pelvis fracture/surgery     Dr. Cheri Rous  . PORTACATH PLACEMENT Right 09/05/2019   Procedure: INSERTION PORT-A-CATH;  Surgeon: Aviva Signs, MD;  Location: [facility];  Service: General;  Laterality: Right;  . TONSILLECTOMY    . VIDEO BRONCHOSCOPY WITH ENDOBRONCHIAL NAVIGATION Left 12/31/2016   Procedure: VIDEO BRONCHOSCOPY WITH ENDOBRONCHIAL NAVIGATION, left upper lung lobe;  Surgeon: Collene Gobble, MD;  Location: [facility];  Service: Thoracic;  Laterality: Left;  . YAG LASER  APPLICATION Right 97/98/9211   Procedure: YAG LASER APPLICATION;  Surgeon: Rutherford Guys, MD;  Location: [facility];  Service: Ophthalmology;  Laterality: Right;  . YAG LASER APPLICATION Left 94/17/4081   Procedure: YAG LASER APPLICATION;  Surgeon: Rutherford Guys, MD;  Location: [facility];  Service: Ophthalmology;  Laterality: Left;   Social History   Social History Narrative  . Not on file   Immunization History  Administered Date(s) Administered  . Fluad Quad(high Dose 65+) 10/19/2019  . Influenza-Unspecified 12/19/2016  . Moderna SARS-COV2 Booster Vaccination 02/16/2020  . Moderna Sars-Covid-2 Vaccination 04/22/2019, 05/20/2019  . Pneumococcal Conjugate-13 10/20/2012  . Pneumococcal Polysaccharide-23 07/01/2004, 12/15/2012  . Tdap 07/26/2010  . Zoster 08/23/2009     Objective: Vital Signs: BP (!) 141/89 (BP Location: Left Arm, Patient Position: Sitting, Cuff Size: Normal)   Pulse 85   Resp 16   Ht 5\' 4"  (1.626 m)   Wt 172 lb (78 kg) Comment: per patient, patient in wheelchair  BMI 29.52 kg/m    Physical Exam Vitals and nursing note reviewed.  Constitutional:      Appearance: She is well-developed and well-nourished.  HENT:     Head: Normocephalic and  atraumatic.  Eyes:     Extraocular Movements: EOM normal.     Conjunctiva/sclera: Conjunctivae normal.  Cardiovascular:     Rate and Rhythm: Normal rate and regular rhythm.     Pulses: Intact distal pulses.     Heart sounds: Normal heart sounds.  Pulmonary:     Effort: Pulmonary effort is normal.     Breath sounds: Normal breath sounds.  Abdominal:     General: Bowel sounds are normal.     Palpations: Abdomen is soft.  Musculoskeletal:     Cervical back: Normal range of motion.  Lymphadenopathy:     Cervical: No cervical adenopathy.  Skin:    General: Skin is warm and dry.     Capillary Refill: Capillary refill takes less than 2 seconds.  Neurological:     Mental Status: She is alert and oriented to person, place, and  time.  Psychiatric:        Mood and Affect: Mood and affect normal.        Behavior: Behavior normal.      Musculoskeletal Exam: C-spine was in good range of motion.  She had limited range of motion of her left shoulder joint.  Right shoulder joint was in full range of motion.  She has good range of motion of her shoulder joints.  She has incomplete extension of her MCP joints.  No synovitis was noted.  Hip joints were difficult to assess in the wheelchair.  She had difficulty lifting her left leg to assess her left knee joint.  No synovitis was noted.  There was no tenderness over ankles or MTPs.  CDAI Exam: CDAI Score: 0  Patient Global: 0 mm; Provider Global: 0 mm Swollen: 0 ; Tender: 0  Joint Exam 02/22/2020   No joint exam has been documented for this visit   There is currently no information documented on the homunculus. Go to the Rheumatology activity and complete the homunculus joint exam.  Investigation: No additional findings.  Imaging: No results found.  Recent Labs: Lab Results  Component Value Date   WBC 7.5 01/24/2020   HGB 9.4 (L) 01/24/2020   PLT 88 (L) 01/24/2020   NA 136 01/24/2020   K 4.1 01/24/2020   CL 100 01/24/2020   CO2 25 01/24/2020   GLUCOSE 99 01/24/2020   BUN 26 (H) 01/24/2020   CREATININE 1.03 (H) 01/24/2020   BILITOT 0.4 01/24/2020   ALKPHOS 128 (H) 01/24/2020   AST 50 (H) 01/24/2020   ALT 38 01/24/2020   PROT 6.5 01/24/2020   ALBUMIN 3.0 (L) 01/24/2020   CALCIUM 8.6 (L) 01/24/2020   GFRAA 51 (L) 10/19/2019    Speciality Comments: PLQ Eye exam: 09/08/18 WNL @ [facility] follow up in 1 year  Procedures:  No procedures performed Allergies: Oxycontin [oxycodone], Quinine derivatives, Sulfa antibiotics, Barium-containing compounds, and Percocet [oxycodone-acetaminophen]   Assessment / Plan:     Visit Diagnoses: Rheumatoid arthritis involving multiple sites with positive rheumatoid factor (HCC) - +RF, +CCP, +ANA, erosive, contractures: She  has severe end-stage arthritis but no active synovitis on my examination today.  She has been tolerating Arava and Plaquenil well.  I reviewed her labs.  She has anemia, thrombocytopenia, elevated LFTs and low GFR.  I have advised her to discontinue Areva and Plaquenil.  She cannot get eye examination.  I would consider restarting medications in the future once her labs have normalized and she starts having symptoms.  Increased risk of ocular toxicity was discussed without getting annual eye  exams.  High risk medication use - Arava 10 mg 2 tablets daily and Plaquenil 200 mg 2 tablets daily Monday through Friday only. PLQ Eye exam: 09/08/18   Chondrocalcinosis-she had no recent flare.  Primary osteoarthritis of both hands-she has severe osteoarthritis in her hands.  Primary osteoarthritis of left knee-she has difficulty extending her left knee joint but no synovitis was noted.  History of total right knee replacement - She is primarily wheelchair-bound at this point.  Primary malignant neoplasm of left upper lobe of lung ([place])  Metastatic non-small cell lung cancer ([place]) - Metastasis to brain and liver  History of hypertension  Finger clubbing  History of anemia  History of gastroesophageal reflux (GERD)  Orders: No orders of the defined types were placed in this encounter.  No orders of the defined types were placed in this encounter.   Follow-Up Instructions: Return in about 2 months (around 04/21/2020) for Rheumatoid arthritis.   Bo Merino, MD  Note - This record has been created using Editor, commissioning.  Chart creation errors have been sought, but may not always  have been located. Such creation errors do not reflect on  the standard of medical care.

## 2020-02-08 NOTE — Progress Notes (Signed)
Request sent to [facility] for PT, OT, RN, Aide and social work. Awaiting acceptance.

## 2020-02-09 ENCOUNTER — Encounter (HOSPITAL_COMMUNITY): Payer: Self-pay

## 2020-02-09 ENCOUNTER — Other Ambulatory Visit: Payer: Self-pay

## 2020-02-09 ENCOUNTER — Encounter (HOSPITAL_COMMUNITY): Payer: Self-pay | Admitting: Hematology

## 2020-02-09 ENCOUNTER — Inpatient Hospital Stay (HOSPITAL_BASED_OUTPATIENT_CLINIC_OR_DEPARTMENT_OTHER): Payer: Medicare Other | Admitting: Hematology

## 2020-02-09 VITALS — BP 160/101 | HR 102 | Temp 96.6°F | Resp 24 | Wt 172.7 lb

## 2020-02-09 DIAGNOSIS — Z79899 Other long term (current) drug therapy: Secondary | ICD-10-CM | POA: Diagnosis not present

## 2020-02-09 DIAGNOSIS — C7931 Secondary malignant neoplasm of brain: Secondary | ICD-10-CM | POA: Diagnosis not present

## 2020-02-09 DIAGNOSIS — C3490 Malignant neoplasm of unspecified part of unspecified bronchus or lung: Secondary | ICD-10-CM | POA: Diagnosis not present

## 2020-02-09 DIAGNOSIS — M069 Rheumatoid arthritis, unspecified: Secondary | ICD-10-CM | POA: Diagnosis not present

## 2020-02-09 DIAGNOSIS — C787 Secondary malignant neoplasm of liver and intrahepatic bile duct: Secondary | ICD-10-CM | POA: Diagnosis not present

## 2020-02-09 DIAGNOSIS — K5900 Constipation, unspecified: Secondary | ICD-10-CM | POA: Diagnosis not present

## 2020-02-09 DIAGNOSIS — Z87891 Personal history of nicotine dependence: Secondary | ICD-10-CM | POA: Diagnosis not present

## 2020-02-09 DIAGNOSIS — Z993 Dependence on wheelchair: Secondary | ICD-10-CM | POA: Diagnosis not present

## 2020-02-09 DIAGNOSIS — C3412 Malignant neoplasm of upper lobe, left bronchus or lung: Secondary | ICD-10-CM | POA: Diagnosis not present

## 2020-02-09 MED ORDER — SODIUM CHLORIDE 0.9% FLUSH
10.0000 mL | Freq: Once | INTRAVENOUS | Status: AC
  Administered 2020-02-09: 10 mL via INTRAVENOUS

## 2020-02-09 MED ORDER — HEPARIN SOD (PORK) LOCK FLUSH 100 UNIT/ML IV SOLN
500.0000 [IU] | Freq: Once | INTRAVENOUS | Status: AC
  Administered 2020-02-09: 500 [IU] via INTRAVENOUS

## 2020-02-09 NOTE — Progress Notes (Signed)
Rose Lutz, [place] [address]   CLINIC:  Medical Oncology/Hematology  PCP:  Scherrie Bateman [address] / [place] [address] [phone]   REASON FOR VISIT:  Follow-up for metastatic bilateral non-small cell lung cancer to liver and brain  PRIOR THERAPY:  1. Platinum and pemetrexed x 4 cycles from 04/02/2017 to 06/08/2017. 2. Left upper lobectomy on 07/08/2017. 3. Carbo/Pem/Pembro x 4 cycles from 09/20/2018 to 12/06/2018. 4. Pembrolizumab maintenance from 01/03/2019 to 04/25/2019 due to RA flare up. 5. Pemetrexed maintenance x 6 cycles from 06/27/2019 to 11/09/2019.  NGS Results: CFTR carrier  CURRENT THERAPY: Observation  BRIEF ONCOLOGIC HISTORY:  Oncology History  Primary malignant neoplasm of left upper lobe of lung ([facility])  01/22/2017 Initial Diagnosis   Primary malignant neoplasm of left upper lobe of lung ([facility])   09/05/2019 Genetic Testing   CFTR c.[id number][12]T[5] (Intronic) single pathogenic variant identified on a 64 gene custom panel.  The patient is a carrier for Cystic Fibrosis.  This does not change medical management and is not thought to be the cause of her cancer or that in the family. The Custom Hereditary Gene Panel offered by [facility] includes sequencing and/or deletion duplication testing of the following 64 genes: APC*, ATM*, AXIN2, BARD1, BLM, BMPR1A, BRCA1, BRCA2, BRIP1, BUB1B, CASR, CDH1, CDK4, CDKN2A (p14ARF), CDKN2A (p16INK4a), CEP57*, CFTR*, CHEK2, CPA1, CTNNA1, CTRC, DICER1*, ENG*, EPCAM*, FANCC, FLCN, GALNT12, GREM1*, HOXB13, KIT, MEN1*, MLH1*, MLH3*, MSH2*, MSH3*, MSH6*, MUTYH, NBN, NF1*, NTHL1, PALB2, PALLD, PDGFRA, PMS2*, POLD1*, POLE, PRSS1*, PTEN*, RAD50, RAD51C, RAD51D, RNF43, RPS20, SDHA*, SDHB, SDHC*, SDHD, SMAD4, SMARCA4, SPINK1, STK11, TP53, TSC1*, TSC2, and VHL.  The report date is September 05, 2019.   Metastatic non-small cell lung cancer ([facility])  08/19/2019 Initial Diagnosis   Metastatic  non-small cell lung cancer ([facility])   09/05/2019 -  Chemotherapy   The patient had ondansetron (ZOFRAN) injection 8 mg, 8 mg (100 % of original dose 8 mg), Intravenous,  Once, 1 of 1 cycle Dose modification: 8 mg (original dose 8 mg, Cycle 1) ondansetron (ZOFRAN) 8 mg in sodium chloride 0.9 % 50 mL IVPB, , Intravenous,  Once, 4 of 5 cycles Administration: 8 mg (09/05/2019), 8 mg (09/27/2019), 8 mg (10/19/2019), 8 mg (11/09/2019) PEMEtrexed (ALIMTA) 1,000 mg in sodium chloride 0.9 % 100 mL chemo infusion, 500 mg/m2 = 1,000 mg, Intravenous,  Once, 4 of 5 cycles Administration: 1,000 mg (09/05/2019), 1,000 mg (09/27/2019), 1,000 mg (10/19/2019), 1,000 mg (11/09/2019) fosaprepitant (EMEND) 150 mg in sodium chloride 0.9 % 145 mL IVPB, 150 mg, Intravenous,  Once, 1 of 2 cycles Administration: 150 mg (11/09/2019)  for chemotherapy treatment.      CANCER STAGING: Cancer Staging Primary malignant neoplasm of left upper lobe of lung ([facility]) Staging form: Lung, AJCC 8th Edition - Clinical stage from 01/22/2017: Stage IIIA (cT4, cN0, cM0) - Unsigned   INTERVAL HISTORY:  Rose Lutz, a 70 y.o. female, returns for routine follow-up of her metastatic bilateral non-small cell lung cancer to liver and brain. Priscillia was last seen on 12/06/2019.   Today she is accompanied by her husband and she reports feeling okay. She continues having gait issues due to weakness in her left leg which has become weaker. She has not started home health PT/OT yet since no nurses have been available. Her energy levels are decreasing but denies having N/V, SOB or numbness. Her appetite is good and she denies abdominal pain. She denies having vision issues or double  vision. The only walking she can do is from her wheelchair to her bed and back.  She is not able to do most of her ADL's or chores at home and her husband is the one running the household. She is not ready to give up just yet.   REVIEW OF SYSTEMS:  Review of Systems   Constitutional: Positive for appetite change (75%) and fatigue (decreasing).  Eyes: Negative for eye problems.  Respiratory: Negative for shortness of breath.   Gastrointestinal: Negative for abdominal pain, nausea and vomiting.  Musculoskeletal: Positive for gait problem (d/t L leg weakness).  Neurological: Positive for extremity weakness (deconditioned L leg) and gait problem (d/t L leg weakness). Negative for numbness.  All other systems reviewed and are negative.   PAST MEDICAL/SURGICAL HISTORY:  Past Medical History:  Diagnosis Date  . Chronic pain   . Depression   . Essential hypertension   . Family history of breast cancer   . Family history of colon cancer   . Family history of pancreatic cancer   . GERD (gastroesophageal reflux disease)   . History of lung cancer 06/2017  . Osteoarthritis   . Pulmonary asbestosis ([facility])   . Rheumatoid arthritis [facility])    Past Surgical History:  Procedure Laterality Date  . ABDOMINAL HYSTERECTOMY    . BILATERAL OOPHORECTOMY    . CESAREAN SECTION     x3  . FEMUR FRACTURE SURGERY    . KNEE ARTHROPLASTY Right   . LUNG CANCER SURGERY Left 07/08/2017  . pelvis fracture/surgery     Dr. Cheri Rous  . PORTACATH PLACEMENT Right 09/05/2019   Procedure: INSERTION PORT-A-CATH;  Surgeon: Aviva Signs, MD;  Location: [facility];  Service: General;  Laterality: Right;  . TONSILLECTOMY    . VIDEO BRONCHOSCOPY WITH ENDOBRONCHIAL NAVIGATION Left 12/31/2016   Procedure: VIDEO BRONCHOSCOPY WITH ENDOBRONCHIAL NAVIGATION, left upper lung lobe;  Surgeon: Collene Gobble, MD;  Location: [facility];  Service: Thoracic;  Laterality: Left;  . YAG LASER APPLICATION Right 78/29/5621   Procedure: YAG LASER APPLICATION;  Surgeon: Rutherford Guys, MD;  Location: [facility];  Service: Ophthalmology;  Laterality: Right;  . YAG LASER APPLICATION Left 30/86/5784   Procedure: YAG LASER APPLICATION;  Surgeon: Rutherford Guys, MD;  Location: [facility];  Service: Ophthalmology;  Laterality: Left;     SOCIAL HISTORY:  Social History   Socioeconomic History  . Marital status: Married    Spouse name: Not on file  . Number of children: 3  . Years of education: Not on file  . Highest education level: Not on file  Occupational History  . Occupation: DISABLED  Tobacco Use  . Smoking status: Former Smoker    Packs/day: 1.25    Years: 38.00    Pack years: 47.50    Types: Cigarettes    Start date: 01/21/1968    Quit date: 01/20/2006    Years since quitting: 14.0  . Smokeless tobacco: Never Used  Vaping Use  . Vaping Use: Never used  Substance and Sexual Activity  . Alcohol use: No    Alcohol/week: 0.0 standard drinks  . Drug use: No  . Sexual activity: Not Currently  Other Topics Concern  . Not on file  Social History Narrative  . Not on file   Social Determinants of Health   Financial Resource Strain: Medium Risk  . Difficulty of Paying Living Expenses: Somewhat hard  Food Insecurity: No Food Insecurity  . Worried About Charity fundraiser in the Last Year: Never true  .  Ran Out of Food in the Last Year: Never true  Transportation Needs: No Transportation Needs  . Lack of Transportation (Medical): No  . Lack of Transportation (Non-Medical): No  Physical Activity: Inactive  . Days of Exercise per Week: 0 days  . Minutes of Exercise per Session: 0 min  Stress: Stress Concern Present  . Feeling of Stress : To some extent  Social Connections: Moderately Isolated  . Frequency of Communication with Friends and Family: Twice a week  . Frequency of Social Gatherings with Friends and Family: Never  . Attends Religious Services: 1 to 4 times per year  . Active Member of Clubs or Organizations: No  . Attends Archivist Meetings: Never  . Marital Status: Married  Human resources officer Violence: Not At Risk  . Fear of Current or Ex-Partner: No  . Emotionally Abused: No  . Physically Abused: No  . Sexually Abused: No    FAMILY HISTORY:  Family History  Problem  Relation Age of Onset  . Breast cancer Mother 8       d. 61  . Colon cancer Maternal Grandmother 55       d. 70  . Pancreatic cancer Son 71  . Healthy Father   . Arthritis Sister   . Arthritis Paternal Grandmother   . Other Son 50       MVA  . Esophageal cancer Maternal Aunt     CURRENT MEDICATIONS:  Current Outpatient Medications  Medication Sig Dispense Refill  . alendronate (FOSAMAX) 35 MG tablet Take 35 mg by mouth every 7 (seven) days.     Marland Kitchen amitriptyline (ELAVIL) 100 MG tablet Take 100 mg by mouth at bedtime.    . Ascorbic Acid (VITAMIN C) 250 MG CHEW Chew 250 mg by mouth daily.     . cephALEXin (KEFLEX) 500 MG capsule Take 500 mg by mouth 4 (four) times daily.    . cetirizine (ZYRTEC) 10 MG tablet Take 10 mg by mouth daily as needed for allergies.     Marland Kitchen dexamethasone (DECADRON) 4 MG tablet Take 1 tablet (4 mg total) by mouth 4 (four) times daily. 120 tablet 0  . diphenhydrAMINE (BENADRYL) 25 mg capsule Take 25 mg by mouth every 6 (six) hours as needed for itching or allergies.     . folic acid (FOLVITE) 1 MG tablet Take 1 tablet (1 mg total) by mouth daily. 90 tablet 4  . gabapentin (NEURONTIN) 100 MG capsule Take 100 mg by mouth 3 (three) times daily.   5  . hydrALAZINE (APRESOLINE) 25 MG tablet Take 25 mg by mouth daily.     Marland Kitchen HYDROcodone-acetaminophen (NORCO) 10-325 MG tablet Take 2 tablets by mouth every 6 (six) hours as needed for severe pain.     . hydroxychloroquine (PLAQUENIL) 200 MG tablet Take 2 tablets (400 mg total) by mouth every Monday, Tuesday, Wednesday, Thursday, and Friday.    . leflunomide (ARAVA) 10 MG tablet Take 2 tablets (20 mg total) by mouth daily.    Marland Kitchen losartan (COZAAR) 25 MG tablet Take 25 mg by mouth daily.    Marland Kitchen losartan-hydrochlorothiazide (HYZAAR) 50-12.5 MG tablet Take 1 tablet by mouth daily.    . Misc. Devices MISC Please provide patient with oxygen via nasal cannula at 2 liters per minute continuous.  Please provide tubing for oxygen delivery.  1 each 99  . Multiple Vitamin (MULTIVITAMIN WITH MINERALS) TABS tablet Take 1 tablet by mouth daily.    . mupirocin ointment (BACTROBAN) 2 % Apply  1 application topically See admin instructions. 2 to 3 times daily    . omeprazole (PRILOSEC) 20 MG capsule Take 20 mg by mouth daily.    . ondansetron (ZOFRAN-ODT) 4 MG disintegrating tablet Take 4 mg by mouth every 8 (eight) hours as needed for nausea or vomiting.     . Oxycodone HCl 10 MG TABS Take 10 mg by mouth 4 (four) times daily as needed.    . potassium chloride (MICRO-K) 10 MEQ CR capsule Take 10 mEq by mouth 2 (two) times daily.     . prochlorperazine (COMPAZINE) 10 MG tablet Take 10 mg by mouth every 6 (six) hours as needed for nausea or vomiting.     . senna (SENOKOT) 8.6 MG tablet Take 1 tablet by mouth at bedtime.     . valACYclovir (VALTREX) 1000 MG tablet Take 1,000 mg by mouth 3 (three) times daily.    . Vitamin D, Ergocalciferol, (DRISDOL) 50000 units CAPS capsule Take 1 capsule (50,000 Units total) by mouth every 7 (seven) days. 12 capsule 0   No current facility-administered medications for this visit.    ALLERGIES:  Allergies  Allergen Reactions  . Oxycontin [Oxycodone] Other (See Comments)    Hallucinations   . Quinine Derivatives Nausea And Vomiting  . Sulfa Antibiotics Nausea And Vomiting  . Barium-Containing Compounds   . Percocet [Oxycodone-Acetaminophen] Other (See Comments)    Headahces    PHYSICAL EXAM:  Performance status (ECOG): 2 - Symptomatic, <50% confined to bed  Vitals:   02/09/20 1522  BP: (!) 160/101  Pulse: (!) 102  Resp: (!) 24  Temp: (!) 96.6 F (35.9 C)  SpO2: 99%   Wt Readings from Last 3 Encounters:  02/09/20 172 lb 11.2 oz (78.3 kg)  12/27/19 160 lb (72.6 kg)  12/06/19 168 lb 1.6 oz (76.2 kg)   Physical Exam Vitals reviewed.  Constitutional:      Appearance: Normal appearance.     Comments: Wheelchair-bound  Cardiovascular:     Rate and Rhythm: Normal rate and regular rhythm.      Pulses: Normal pulses.     Heart sounds: Normal heart sounds.  Pulmonary:     Effort: Pulmonary effort is normal.     Breath sounds: Normal breath sounds.  Neurological:     General: No focal deficit present.     Mental Status: She is alert and oriented to person, place, and time.  Psychiatric:        Mood and Affect: Mood normal.        Behavior: Behavior normal.      LABORATORY DATA:  I have reviewed the labs as listed.  CBC Latest Ref Rng & Units 01/24/2020 12/27/2019 12/01/2019  WBC 4.0 - 10.5 K/uL 7.5 11.5(H) 8.2  Hemoglobin 12.0 - 15.0 g/dL 9.4(L) 9.4(L) 8.6(L)  Hematocrit 36.0 - 46.0 % 31.1(L) 30.0(L) 28.2(L)  Platelets 150 - 400 K/uL 88(L) 242 277   CMP Latest Ref Rng & Units 01/24/2020 12/27/2019 12/01/2019  Glucose 70 - 99 mg/dL 99 134(H) 95  BUN 8 - 23 mg/dL 26(H) 15 13  Creatinine 0.44 - 1.00 mg/dL 1.03(H) 1.30(H) 1.22(H)  Sodium 135 - 145 mmol/L 136 135 137  Potassium 3.5 - 5.1 mmol/L 4.1 3.4(L) 3.7  Chloride 98 - 111 mmol/L 100 100 105  CO2 22 - 32 mmol/L 25 20(L) 22  Calcium 8.9 - 10.3 mg/dL 8.6(L) 9.3 8.5(L)  Total Protein 6.5 - 8.1 g/dL 6.5 - 6.4(L)  Total Bilirubin 0.3 - 1.2 mg/dL 0.4 -  0.5  Alkaline Phos 38 - 126 U/L 128(H) - 84  AST 15 - 41 U/L 50(H) - 39  ALT 0 - 44 U/L 38 - 20    DIAGNOSTIC IMAGING:  I have independently reviewed the scans and discussed with the patient. No results found.   ASSESSMENT:  1. Metastatic non-small cell lung cancer: -History of stage IIIa (T3 N1 M0) non-small cell lung cancer presented with 2 masses in the left upper lobe as well as questionable left hilar adenopathy diagnosed in December 2018. -4 cycles of platinum and pemetrexed from 04/02/2017 through 06/08/2017 -Left upper lobectomy with en blocLLLsuperior segmentectomy on 07/08/2017, pathology showing invasive adenocarcinoma, moderately to poorly differentiated with focal treatment effect and necrosis, 4.3 cm (mass #1) and invasive squamous cell carcinoma, moderately  to poorly differentiated, 3.1 cm (mass #2), margins negative, YPT3Y PN 1. -PET scan on 08/04/2018 showed hypermetabolic right apical and retroaortic postradiation changes, intensely hypermetabolic subpleural spiculated nodules/correlating mass in the superior segment of the left lower lobe suspicious for locoregional disease recurrence. Small left pleural effusion demonstrated mild FDG activity. Right hepatic lobe segment 5 hypodensity with intense hypermetabolic activity. Hypermetabolic and markedly enlarged porta hepatis lymph nodes intimately related to the caudate lobe. -MRI of the brain on 09/16/2018 shows 2 ring-enhancing lesions in the parasagittal right frontal lobe and long the left parieto-occipital sulcus measuring 1.7 and 1.3 cm respectively. -4 cycles of carbo/Pem/Pembro from 09/20/2018 through 12/06/2018 -Pembrolizumab maintenance from 01/03/2019 through 04/25/2019, with flare of rheumatoid arthritis. -Started on Remicade on 05/27/2019. -She was switched to pemetrexed maintenance on 06/27/2019. -She moved to [facility]. She was due for her pemetrexed on 08/08/2019. -She walks with the help of her walker and does very minimal household work. She needs assistance taking shower and clothing herself. -She has tolerated pemetrexed except some tiredness and decreased appetite lasting 2 to 3 days after each treatment. -CT CAP on 09/02/2019 shows masslike consolidation in the left lower lobe measuring 5.3 x 2.2 cm. Right hepatic lobe lesion measures 5.7 x 4.6 cm. Additional 1.6 x 1.3 low-attenuation lesion medial posterior right hepatic lobe. Enlarged porta hepatic and preaortic lymph nodes. Left hemisacral insufficiency fracture. Fracture through the left superior and inferior pubic rami. Wedge compression deformity of T4 vertebral body.  2. Brain metastasis: -Underwent SRS to the brain meta stasis in the left temporal lobe, right frontal and left frontal lobes. -Last MRI of the brain was  on 06/15/2019. -Brain MRI on 10/11/2019 shows 1.7 x 1.5 cm inferior right frontal lesion, 1.5 x 1.3 cm medial left occipital lesion, 5 mm inferior right frontal lesion, 5 mm left occipital lesion, 3 mm left frontal operculum lesion. -I have compared them with MRI from [place]. All 5 lesions were present previously and the first 2 lesions have significantly decreased in size.  3. Family history: -Maternal grandmother had colon cancer. -Maternal aunt had throat cancer. -Mother had breast cancer. -Oldest son died of pancreatic cancer.   PLAN:  1. Metastatic non-small cell lung cancer: -She had progression based on scans from end of November 2021. - Today she is in a wheelchair accompanied by her husband.  She is not able to walk much as her left leg is weak.  She reportedly had surgery on her left thigh 2 years ago.  She reported this weakness about 3 months ago.  Husband is having to help with all her activities at home. - We have ordered home health but so far none of the home health agencies were able  to respond. - Based on her scans and her performance status I have recommended best supportive care in the form of hospice. - Patient reported that she wants to see her great grandson who will be born in April.  She wants to try any type of treatment which will help her to stay alive until then. - I have quoted response rates of single agent chemotherapy less than 20%. - I have recommended restaging CT CAP. - I have also recommended guardant 360 for any targetable mutations. - We will see her back after the scans and NGS testing.  2. Brain metastasis: -Recent head CT without contrast on 12/27/2019 showed slight progression. - However I have recommended MRI of the brain.  3. Family history: -[facility] testing showed CF TR heterozygosity.  4. Rheumatoid arthritis: -Remicade was held as she was receiving chemotherapy.  5. Constipation: -Continue lactulose as needed.    Orders  placed this encounter:  Orders Placed This Encounter  Procedures  . CT CHEST ABDOMEN PELVIS W CONTRAST  . MR Brain [place], MD [place] [phone]   I, Milinda Antis, am acting as a scribe for Dr. Sanda Linger.  I, Derek Jack MD, have reviewed the above documentation for accuracy and completeness, and I agree with the above.

## 2020-02-09 NOTE — Progress Notes (Signed)
Patient not accepted by [facility] for home health. [facility] does not have nurse aide availability. No answer from [facility]. Message left with [facility]. Awaiting response

## 2020-02-09 NOTE — Progress Notes (Signed)
Patient's port accessed, flushed and deaccessed. See MAR for medication administration details. Guardant 360 drawn from port. Patient discharged in satisfactory condition with husband via wheelchair.

## 2020-02-09 NOTE — Progress Notes (Signed)
Phone call received from [facility] that they are unable to accept the patient for home health at this time.

## 2020-02-09 NOTE — Patient Instructions (Signed)
[place] at [facility] Discharge Instructions  You were seen today by [name]. He went over your recent results and scans. You will be scheduled for a CT scan of your chest and abdomen and an MRI of your brain. Your previous biopsy tissue will be sent for further analysis using Foundation 1 to determine if more therapies are possible. [name] will see you back in 2 to 3 weeks for follow up.   Thank you for choosing [place] at [facility] to provide your oncology and hematology care.  To afford each patient quality time with our provider, please arrive at least 15 minutes before your scheduled appointment time.   If you have a lab appointment with the [facility] please come in thru the Main Entrance and check in at the main information desk  You need to re-schedule your appointment should you arrive 10 or more minutes late.  We strive to give you quality time with our providers, and arriving late affects you and other patients whose appointments are after yours.  Also, if you no show three or more times for appointments you may be dismissed from the clinic at the providers discretion.     Again, thank you for choosing [facility].  Our hope is that these requests will decrease the amount of time that you wait before being seen by our physicians.       _____________________________________________________________  Should you have questions after your visit to [facility], please contact our office at [phone] between the hours of 8:00 a.m. and 4:30 p.m.  Voicemails left after 4:00 p.m. will not be returned until the following business day.  For prescription refill requests, have your pharmacy contact our office and allow 72 hours.    Cancer Center Support Programs:   > Cancer Support Group  2nd Tuesday of the month 1pm-2pm, Journey Room

## 2020-02-09 NOTE — Progress Notes (Signed)
No return phone call from [facility] received and remain unable to reach regarding [facility] services. Phone call placed to Glennis Brink [phone] with [facility] requesting [facility] services. Awaiting response.

## 2020-02-13 ENCOUNTER — Encounter (HOSPITAL_COMMUNITY): Payer: Self-pay

## 2020-02-13 NOTE — Progress Notes (Signed)
Foundation One and PD-L1 requested per Dr. Katragadda 

## 2020-02-14 ENCOUNTER — Encounter (HOSPITAL_COMMUNITY): Payer: Self-pay | Admitting: General Practice

## 2020-02-14 NOTE — Progress Notes (Signed)
[facility] CSW Progress Notes  CSW called Aging, Disability, Transportation Services to see if there are any resources for in home aide for patients without Medicaid.  Per Luz Lex, ADTS, there is a 3 year wait list for services under the block grant program.   Edwyna Shell, LCSW Clinical Social Worker Phone:  [phone]

## 2020-02-15 DIAGNOSIS — Z515 Encounter for palliative care: Secondary | ICD-10-CM | POA: Diagnosis not present

## 2020-02-15 DIAGNOSIS — B370 Candidal stomatitis: Secondary | ICD-10-CM | POA: Diagnosis not present

## 2020-02-15 DIAGNOSIS — C7931 Secondary malignant neoplasm of brain: Secondary | ICD-10-CM | POA: Diagnosis not present

## 2020-02-15 DIAGNOSIS — C787 Secondary malignant neoplasm of liver and intrahepatic bile duct: Secondary | ICD-10-CM | POA: Diagnosis not present

## 2020-02-15 DIAGNOSIS — C3490 Malignant neoplasm of unspecified part of unspecified bronchus or lung: Secondary | ICD-10-CM | POA: Diagnosis not present

## 2020-02-16 ENCOUNTER — Ambulatory Visit: Payer: Medicare Other | Attending: Internal Medicine

## 2020-02-16 DIAGNOSIS — Z23 Encounter for immunization: Secondary | ICD-10-CM

## 2020-02-16 NOTE — Progress Notes (Signed)
   Covid-19 Vaccination Clinic  Name:  Rose Lutz    MRN: [id number] DOB: [birth year]  02/16/2020  [name] was observed post Covid-19 immunization for 15 minutes without incident. She was provided with Vaccine Information Sheet and instruction to access the V-Safe system.   [name] was instructed to call 911 with any severe reactions post vaccine: Marland Kitchen Difficulty breathing  . Swelling of face and throat  . A fast heartbeat  . A bad rash all over body  . Dizziness and weakness   Immunizations Administered    Name Date Dose VIS Date Route   Moderna Covid-19 Booster Vaccine 02/16/2020  1:34 PM 0.25 mL 11/09/2019 Intramuscular   Manufacturer: Moderna   Lot: [id number]   [place]: 98022-179-81

## 2020-02-20 DIAGNOSIS — C3490 Malignant neoplasm of unspecified part of unspecified bronchus or lung: Secondary | ICD-10-CM | POA: Diagnosis not present

## 2020-02-21 ENCOUNTER — Encounter (HOSPITAL_COMMUNITY): Payer: Self-pay

## 2020-02-21 DIAGNOSIS — C3490 Malignant neoplasm of unspecified part of unspecified bronchus or lung: Secondary | ICD-10-CM | POA: Diagnosis not present

## 2020-02-21 DIAGNOSIS — C3412 Malignant neoplasm of upper lobe, left bronchus or lung: Secondary | ICD-10-CM | POA: Diagnosis not present

## 2020-02-22 ENCOUNTER — Other Ambulatory Visit: Payer: Self-pay

## 2020-02-22 ENCOUNTER — Encounter: Payer: Self-pay | Admitting: Rheumatology

## 2020-02-22 ENCOUNTER — Ambulatory Visit: Payer: Medicare Other | Admitting: Rheumatology

## 2020-02-22 VITALS — BP 141/89 | HR 85 | Resp 16 | Ht 64.0 in | Wt 172.0 lb

## 2020-02-22 DIAGNOSIS — M19042 Primary osteoarthritis, left hand: Secondary | ICD-10-CM

## 2020-02-22 DIAGNOSIS — Z862 Personal history of diseases of the blood and blood-forming organs and certain disorders involving the immune mechanism: Secondary | ICD-10-CM

## 2020-02-22 DIAGNOSIS — Z8719 Personal history of other diseases of the digestive system: Secondary | ICD-10-CM

## 2020-02-22 DIAGNOSIS — M1712 Unilateral primary osteoarthritis, left knee: Secondary | ICD-10-CM

## 2020-02-22 DIAGNOSIS — C3490 Malignant neoplasm of unspecified part of unspecified bronchus or lung: Secondary | ICD-10-CM | POA: Diagnosis not present

## 2020-02-22 DIAGNOSIS — M1120 Other chondrocalcinosis, unspecified site: Secondary | ICD-10-CM

## 2020-02-22 DIAGNOSIS — Z8679 Personal history of other diseases of the circulatory system: Secondary | ICD-10-CM

## 2020-02-22 DIAGNOSIS — Z96651 Presence of right artificial knee joint: Secondary | ICD-10-CM | POA: Diagnosis not present

## 2020-02-22 DIAGNOSIS — M19041 Primary osteoarthritis, right hand: Secondary | ICD-10-CM | POA: Diagnosis not present

## 2020-02-22 DIAGNOSIS — M0579 Rheumatoid arthritis with rheumatoid factor of multiple sites without organ or systems involvement: Secondary | ICD-10-CM

## 2020-02-22 DIAGNOSIS — C3412 Malignant neoplasm of upper lobe, left bronchus or lung: Secondary | ICD-10-CM | POA: Diagnosis not present

## 2020-02-22 DIAGNOSIS — R683 Clubbing of fingers: Secondary | ICD-10-CM | POA: Diagnosis not present

## 2020-02-22 DIAGNOSIS — Z79899 Other long term (current) drug therapy: Secondary | ICD-10-CM | POA: Diagnosis not present

## 2020-02-22 NOTE — Patient Instructions (Signed)
Standing Labs We placed an order today for your standing lab work.   Please have your standing labs drawn in March  If possible, please have your labs drawn 2 weeks prior to your appointment so that the provider can discuss your results at your appointment.  We have open lab daily Monday through Thursday from 8:30-12:30 PM and 1:30-4:30 PM and Friday from 8:30-12:30 PM and 1:30-4:00 PM at the office of Dr. Bo Merino, [facility].   Please be advised, all patients with office appointments requiring lab work will take precedents over walk-in lab work.  If possible, please come for your lab work on Monday and Friday afternoons, as you may experience shorter wait times. The office is located at [address], [place], [place], [place] [address] No appointment is necessary.   Labs are drawn by [facility]. Please bring your co-pay at the time of your lab draw.  You may receive a bill from [facility] for your lab work.  If you wish to have your labs drawn at another location, please call the office 24 hours in advance to send orders.  If you have any questions regarding directions or hours of operation,  please call [phone].   As a reminder, please drink plenty of water prior to coming for your lab work. Thanks!

## 2020-02-27 ENCOUNTER — Encounter (HOSPITAL_COMMUNITY): Payer: Self-pay

## 2020-03-02 DIAGNOSIS — G894 Chronic pain syndrome: Secondary | ICD-10-CM | POA: Diagnosis not present

## 2020-03-02 DIAGNOSIS — M069 Rheumatoid arthritis, unspecified: Secondary | ICD-10-CM | POA: Diagnosis not present

## 2020-03-02 DIAGNOSIS — C3492 Malignant neoplasm of unspecified part of left bronchus or lung: Secondary | ICD-10-CM | POA: Diagnosis not present

## 2020-03-03 DIAGNOSIS — C3412 Malignant neoplasm of upper lobe, left bronchus or lung: Secondary | ICD-10-CM | POA: Diagnosis not present

## 2020-03-05 ENCOUNTER — Encounter (HOSPITAL_COMMUNITY): Payer: Self-pay

## 2020-03-06 ENCOUNTER — Ambulatory Visit (HOSPITAL_COMMUNITY)
Admission: RE | Admit: 2020-03-06 | Discharge: 2020-03-06 | Disposition: A | Discharge status: Home / Self Care | Payer: Medicare Other | Source: Ambulatory Visit | Attending: Hematology | Admitting: Hematology

## 2020-03-06 ENCOUNTER — Other Ambulatory Visit: Payer: Self-pay

## 2020-03-06 DIAGNOSIS — K7689 Other specified diseases of liver: Secondary | ICD-10-CM | POA: Diagnosis not present

## 2020-03-06 DIAGNOSIS — C3490 Malignant neoplasm of unspecified part of unspecified bronchus or lung: Secondary | ICD-10-CM | POA: Diagnosis not present

## 2020-03-06 DIAGNOSIS — I7789 Other specified disorders of arteries and arterioles: Secondary | ICD-10-CM | POA: Diagnosis not present

## 2020-03-06 DIAGNOSIS — C7931 Secondary malignant neoplasm of brain: Secondary | ICD-10-CM | POA: Diagnosis not present

## 2020-03-06 DIAGNOSIS — I700 Atherosclerosis of aorta: Secondary | ICD-10-CM | POA: Diagnosis not present

## 2020-03-06 DIAGNOSIS — G936 Cerebral edema: Secondary | ICD-10-CM | POA: Diagnosis not present

## 2020-03-06 DIAGNOSIS — J432 Centrilobular emphysema: Secondary | ICD-10-CM | POA: Diagnosis not present

## 2020-03-06 DIAGNOSIS — H748X3 Other specified disorders of middle ear and mastoid, bilateral: Secondary | ICD-10-CM | POA: Diagnosis not present

## 2020-03-06 DIAGNOSIS — I2510 Atherosclerotic heart disease of native coronary artery without angina pectoris: Secondary | ICD-10-CM | POA: Diagnosis not present

## 2020-03-06 DIAGNOSIS — N261 Atrophy of kidney (terminal): Secondary | ICD-10-CM | POA: Diagnosis not present

## 2020-03-06 LAB — POCT I-STAT CREATININE: Creatinine, Ser: 1.4 mg/dL — ABNORMAL HIGH (ref 0.44–1.00)

## 2020-03-06 MED ORDER — IOHEXOL 300 MG/ML  SOLN
80.0000 mL | Freq: Once | INTRAMUSCULAR | Status: AC | PRN
  Administered 2020-03-06: 80 mL via INTRAVENOUS

## 2020-03-06 MED ORDER — GADOBUTROL 1 MMOL/ML IV SOLN
7.0000 mL | Freq: Once | INTRAVENOUS | Status: AC | PRN
  Administered 2020-03-06: 7 mL via INTRAVENOUS

## 2020-03-07 DIAGNOSIS — Z515 Encounter for palliative care: Secondary | ICD-10-CM | POA: Diagnosis not present

## 2020-03-07 DIAGNOSIS — C787 Secondary malignant neoplasm of liver and intrahepatic bile duct: Secondary | ICD-10-CM | POA: Diagnosis not present

## 2020-03-07 DIAGNOSIS — C3490 Malignant neoplasm of unspecified part of unspecified bronchus or lung: Secondary | ICD-10-CM | POA: Diagnosis not present

## 2020-03-07 DIAGNOSIS — C7931 Secondary malignant neoplasm of brain: Secondary | ICD-10-CM | POA: Diagnosis not present

## 2020-03-08 ENCOUNTER — Other Ambulatory Visit: Payer: Self-pay

## 2020-03-08 ENCOUNTER — Inpatient Hospital Stay (HOSPITAL_COMMUNITY): Payer: Medicare Other | Attending: Hematology | Admitting: Hematology

## 2020-03-08 VITALS — BP 144/84 | HR 94 | Temp 96.8°F | Resp 18 | Wt 164.3 lb

## 2020-03-08 DIAGNOSIS — R634 Abnormal weight loss: Secondary | ICD-10-CM | POA: Insufficient documentation

## 2020-03-08 DIAGNOSIS — R519 Headache, unspecified: Secondary | ICD-10-CM | POA: Insufficient documentation

## 2020-03-08 DIAGNOSIS — C3490 Malignant neoplasm of unspecified part of unspecified bronchus or lung: Secondary | ICD-10-CM

## 2020-03-08 DIAGNOSIS — R109 Unspecified abdominal pain: Secondary | ICD-10-CM | POA: Insufficient documentation

## 2020-03-08 DIAGNOSIS — Z87891 Personal history of nicotine dependence: Secondary | ICD-10-CM | POA: Insufficient documentation

## 2020-03-08 DIAGNOSIS — Z993 Dependence on wheelchair: Secondary | ICD-10-CM | POA: Insufficient documentation

## 2020-03-08 DIAGNOSIS — C3411 Malignant neoplasm of upper lobe, right bronchus or lung: Secondary | ICD-10-CM | POA: Diagnosis not present

## 2020-03-08 DIAGNOSIS — C7931 Secondary malignant neoplasm of brain: Secondary | ICD-10-CM | POA: Insufficient documentation

## 2020-03-08 DIAGNOSIS — M069 Rheumatoid arthritis, unspecified: Secondary | ICD-10-CM | POA: Diagnosis not present

## 2020-03-08 DIAGNOSIS — C3412 Malignant neoplasm of upper lobe, left bronchus or lung: Secondary | ICD-10-CM | POA: Diagnosis not present

## 2020-03-08 DIAGNOSIS — C787 Secondary malignant neoplasm of liver and intrahepatic bile duct: Secondary | ICD-10-CM | POA: Insufficient documentation

## 2020-03-08 DIAGNOSIS — K5900 Constipation, unspecified: Secondary | ICD-10-CM | POA: Diagnosis not present

## 2020-03-08 MED ORDER — DEXAMETHASONE 2 MG PO TABS: 2.0000 mg | ORAL_TABLET | Freq: Every day | ORAL | 0 refills | Status: AC

## 2020-03-08 NOTE — Patient Instructions (Signed)
[place] at [facility] Discharge Instructions  You were seen and examined today by [name]. [name] discussed your recent scan results. Your scans revealed further progression of the cancer. [name] discussed treatment options available to you. [name] discussed radiation to the brain. Following that, [name] would see you again to discuss potential chemotherapy treatment, depending on how you tolerate the radiation.   Follow-up as scheduled.   Thank you for choosing [place] at [facility] to provide your oncology and hematology care.  To afford each patient quality time with our provider, please arrive at least 15 minutes before your scheduled appointment time.   If you have a lab appointment with the [facility] please come in thru the Main Entrance and check in at the main information desk.  You need to re-schedule your appointment should you arrive 10 or more minutes late.  We strive to give you quality time with our providers, and arriving late affects you and other patients whose appointments are after yours.  Also, if you no show three or more times for appointments you may be dismissed from the clinic at the providers discretion.     Again, thank you for choosing [facility].  Our hope is that these requests will decrease the amount of time that you wait before being seen by our physicians.       _____________________________________________________________  Should you have questions after your visit to [facility], please contact our office at [phone] and follow the prompts.  Our office hours are 8:00 a.m. and 4:30 p.m. Monday - Friday.  Please note that voicemails left after 4:00 p.m. may not be returned until the following business day.  We are closed weekends and major holidays.  You do have access to a nurse 24-7, just call the main number to the clinic [phone]  and do not press any options, hold on the line and a nurse will answer the phone.    For prescription refill requests, have your pharmacy contact our office and allow 72 hours.    Due to Covid, you will need to wear a mask upon entering the hospital. If you do not have a mask, a mask will be given to you at the Main Entrance upon arrival. For doctor visits, patients may have 1 support person age 2 or older with them. For treatment visits, patients can not have anyone with them due to social distancing guidelines and our immunocompromised population.

## 2020-03-08 NOTE — Progress Notes (Addendum)
[place], [place] [address]   CLINIC:  Medical Oncology/Hematology  PCP:  Scherrie Bateman [address] / [place] [address] [phone]   REASON FOR VISIT:  Follow-up for metastatic bilateral non-small cell lung cancer to liver and brain  PRIOR THERAPY:  1. Platinum and pemetrexed x 4 cycles from 04/02/2017 to 06/08/2017. 2. Left upper lobectomy on 07/08/2017. 3. Carbo/Pem/Pembro x 4 cycles from 09/20/2018 to 12/06/2018. 4. Pembrolizumab maintenance from 01/03/2019 to 04/25/2019 due to RA flare up. 5. Pemetrexed maintenance x 6 cycles from 06/27/2019 to 11/09/2019.  NGS Results: PD-L1 TPS 0%, Foundation 1 MS--stable, TMB 29 Muts/Mb, CFTR carrier  CURRENT THERAPY: Surveillance  BRIEF ONCOLOGIC HISTORY:  Oncology History  Primary malignant neoplasm of left upper lobe of lung ([place])  01/22/2017 Initial Diagnosis   Primary malignant neoplasm of left upper lobe of lung ([place])   09/05/2019 Genetic Testing   CFTR c.[id number][12]T[5] (Intronic) single pathogenic variant identified on a 64 gene custom panel.  The patient is a carrier for Cystic Fibrosis.  This does not change medical management and is not thought to be the cause of her cancer or that in the family. The Custom Hereditary Gene Panel offered by [facility] includes sequencing and/or deletion duplication testing of the following 64 genes: APC*, ATM*, AXIN2, BARD1, BLM, BMPR1A, BRCA1, BRCA2, BRIP1, BUB1B, CASR, CDH1, CDK4, CDKN2A (p14ARF), CDKN2A (p16INK4a), CEP57*, CFTR*, CHEK2, CPA1, CTNNA1, CTRC, DICER1*, ENG*, EPCAM*, FANCC, FLCN, GALNT12, GREM1*, HOXB13, KIT, MEN1*, MLH1*, MLH3*, MSH2*, MSH3*, MSH6*, MUTYH, NBN, NF1*, NTHL1, PALB2, PALLD, PDGFRA, PMS2*, POLD1*, POLE, PRSS1*, PTEN*, RAD50, RAD51C, RAD51D, RNF43, RPS20, SDHA*, SDHB, SDHC*, SDHD, SMAD4, SMARCA4, SPINK1, STK11, TP53, TSC1*, TSC2, and VHL.  The report date is September 05, 2019.   02/20/2020 Genetic Testing    Guardant 360:      02/27/2020 Genetic Testing   PD-L1:     03/03/2020 Genetic Testing   Foundation One:     Bone metastases ([place])  08/19/2019 Initial Diagnosis   Metastatic non-small cell lung cancer ([place])   09/05/2019 -  Chemotherapy   The patient had ondansetron (ZOFRAN) injection 8 mg, 8 mg (100 % of original dose 8 mg), Intravenous,  Once, 1 of 1 cycle Dose modification: 8 mg (original dose 8 mg, Cycle 1) ondansetron (ZOFRAN) 8 mg in sodium chloride 0.9 % 50 mL IVPB, , Intravenous,  Once, 4 of 5 cycles Administration: 8 mg (09/05/2019), 8 mg (09/27/2019), 8 mg (10/19/2019), 8 mg (11/09/2019) PEMEtrexed (ALIMTA) 1,000 mg in sodium chloride 0.9 % 100 mL chemo infusion, 500 mg/m2 = 1,000 mg, Intravenous,  Once, 4 of 5 cycles Administration: 1,000 mg (09/05/2019), 1,000 mg (09/27/2019), 1,000 mg (10/19/2019), 1,000 mg (11/09/2019) fosaprepitant (EMEND) 150 mg in sodium chloride 0.9 % 145 mL IVPB, 150 mg, Intravenous,  Once, 1 of 2 cycles Administration: 150 mg (11/09/2019)  for chemotherapy treatment.    02/20/2020 Genetic Testing   Guardant 360:      02/27/2020 Genetic Testing   PD-L1:     03/03/2020 Genetic Testing   Foundation One:       CANCER STAGING: Cancer Staging Primary malignant neoplasm of left upper lobe of lung ([place]) Staging form: Lung, AJCC 8th Edition - Clinical stage from 01/22/2017: Stage IIIA (cT4, cN0, cM0) - Unsigned   INTERVAL HISTORY:  Rose Lutz, a 70 y.o. female, returns for routine follow-up of her metastatic bilateral non-small cell lung cancer to liver and brain. Rose Lutz was last seen on 02/09/2020.  Today she reports feeling fair. She notes that she spends most of the day sedentary and ambulates partially with the walker and in the wheelchair; she needs assistance to dress herself, was able to shower herself today, fix her meals and feed herself. She notes having some left leg weakness which has gotten slightly worse. She continues  having intermittent headaches lasting several seconds but denies vision changes or blurry vision. She notes having intermittent pain in her abdomen but denies having any N/V or new aches or pains. Her appetite is decreased but she is trying to eat more junk food and generally eats 2 meals, half-portions, daily. She has never tried Boost or Ensure. She was stopped from taking Decadron since she was not feeling better while on it and could not sleep while taking it.  She is planning on driving to [place] to see her great-grandson. She plans on going to [place] when she has a bit more strength.  She prefers to go to [place] for her radiation, but will go to [place] if necessary.  REVIEW OF SYSTEMS:  Review of Systems  Constitutional: Positive for appetite change (25%), fatigue (depleted) and unexpected weight change (lost 8 lbs in 2 weeks).  Eyes: Negative for eye problems.  Respiratory: Positive for cough (intermittent) and shortness of breath.   Gastrointestinal: Positive for abdominal pain (intermittent) and constipation. Negative for nausea and vomiting.  Musculoskeletal: Positive for arthralgias (arthritic joint pains). Negative for myalgias.  Neurological: Positive for dizziness, extremity weakness (LLE weakness; worsening) and headaches (intermittent; several seconds).  Psychiatric/Behavioral: Positive for depression.  All other systems reviewed and are negative.   PAST MEDICAL/SURGICAL HISTORY:  Past Medical History:  Diagnosis Date  . Chronic pain   . Depression   . Essential hypertension   . Family history of breast cancer   . Family history of colon cancer   . Family history of pancreatic cancer   . GERD (gastroesophageal reflux disease)   . History of lung cancer 06/2017  . Osteoarthritis   . Pulmonary asbestosis ([place])   . Rheumatoid arthritis [facility])    Past Surgical History:  Procedure Laterality Date  . ABDOMINAL HYSTERECTOMY    . BILATERAL OOPHORECTOMY    .  CESAREAN SECTION     x3  . FEMUR FRACTURE SURGERY    . KNEE ARTHROPLASTY Right   . LUNG CANCER SURGERY Left 07/08/2017  . pelvis fracture/surgery     Dr. Cheri Rous  . PORTACATH PLACEMENT Right 09/05/2019   Procedure: INSERTION PORT-A-CATH;  Surgeon: Aviva Signs, MD;  Location: [facility];  Service: General;  Laterality: Right;  . TONSILLECTOMY    . VIDEO BRONCHOSCOPY WITH ENDOBRONCHIAL NAVIGATION Left 12/31/2016   Procedure: VIDEO BRONCHOSCOPY WITH ENDOBRONCHIAL NAVIGATION, left upper lung lobe;  Surgeon: Collene Gobble, MD;  Location: [facility];  Service: Thoracic;  Laterality: Left;  . YAG LASER APPLICATION Right 96/29/5284   Procedure: YAG LASER APPLICATION;  Surgeon: Rutherford Guys, MD;  Location: [facility];  Service: Ophthalmology;  Laterality: Right;  . YAG LASER APPLICATION Left 13/24/4010   Procedure: YAG LASER APPLICATION;  Surgeon: Rutherford Guys, MD;  Location: [facility];  Service: Ophthalmology;  Laterality: Left;    SOCIAL HISTORY:  Social History   Socioeconomic History  . Marital status: Married    Spouse name: Not on file  . Number of children: 3  . Years of education: Not on file  . Highest education level: Not on file  Occupational History  . Occupation: DISABLED  Tobacco Use  .  Smoking status: Former Smoker    Packs/day: 1.25    Years: 38.00    Pack years: 47.50    Types: Cigarettes    Start date: 01/21/1968    Quit date: 01/20/2006    Years since quitting: 14.1  . Smokeless tobacco: Never Used  Vaping Use  . Vaping Use: Never used  Substance and Sexual Activity  . Alcohol use: No    Alcohol/week: 0.0 standard drinks  . Drug use: No  . Sexual activity: Not Currently  Other Topics Concern  . Not on file  Social History Narrative  . Not on file   Social Determinants of Health   Financial Resource Strain: Medium Risk  . Difficulty of Paying Living Expenses: Somewhat hard  Food Insecurity: No Food Insecurity  . Worried About Charity fundraiser in the Last Year: Never  true  . Ran Out of Food in the Last Year: Never true  Transportation Needs: No Transportation Needs  . Lack of Transportation (Medical): No  . Lack of Transportation (Non-Medical): No  Physical Activity: Inactive  . Days of Exercise per Week: 0 days  . Minutes of Exercise per Session: 0 min  Stress: Stress Concern Present  . Feeling of Stress : To some extent  Social Connections: Moderately Isolated  . Frequency of Communication with Friends and Family: Twice a week  . Frequency of Social Gatherings with Friends and Family: Never  . Attends Religious Services: 1 to 4 times per year  . Active Member of Clubs or Organizations: No  . Attends Archivist Meetings: Never  . Marital Status: Married  Human resources officer Violence: Not At Risk  . Fear of Current or Ex-Partner: No  . Emotionally Abused: No  . Physically Abused: No  . Sexually Abused: No    FAMILY HISTORY:  Family History  Problem Relation Age of Onset  . Breast cancer Mother 55       d. 13  . Colon cancer Maternal Grandmother 45       d. 58  . Pancreatic cancer Son 31  . Healthy Father   . Arthritis Sister   . Arthritis Paternal Grandmother   . Other Son 83       MVA  . Esophageal cancer Maternal Aunt     CURRENT MEDICATIONS:  Current Outpatient Medications  Medication Sig Dispense Refill  . alendronate (FOSAMAX) 35 MG tablet Take 35 mg by mouth every 7 (seven) days.    Marland Kitchen amitriptyline (ELAVIL) 100 MG tablet Take 100 mg by mouth at bedtime.    . Ascorbic Acid (VITAMIN C) 250 MG CHEW Chew 250 mg by mouth daily.     . cetirizine (ZYRTEC) 10 MG tablet Take 10 mg by mouth daily as needed for allergies.     Marland Kitchen dexamethasone (DECADRON) 2 MG tablet Take 1 tablet (2 mg total) by mouth daily. 14 tablet 0  . dexamethasone (DECADRON) 4 MG tablet Take 1 tablet (4 mg total) by mouth 4 (four) times daily. 120 tablet 0  . diphenhydrAMINE (BENADRYL) 25 mg capsule Take 25 mg by mouth every 6 (six) hours as needed for  itching or allergies.     Marland Kitchen gabapentin (NEURONTIN) 100 MG capsule Take 100 mg by mouth 3 (three) times daily.   5  . hydrALAZINE (APRESOLINE) 25 MG tablet Take 25 mg by mouth daily.     Marland Kitchen HYDROcodone-acetaminophen (NORCO) 10-325 MG tablet Take 2 tablets by mouth every 6 (six) hours as needed for severe pain.     Marland Kitchen  losartan-hydrochlorothiazide (HYZAAR) 50-12.5 MG tablet Take 1 tablet by mouth daily.    . metoprolol succinate (TOPROL-XL) 50 MG 24 hr tablet Take 50 mg by mouth daily.    . Misc. Devices MISC Please provide patient with oxygen via nasal cannula at 2 liters per minute continuous.  Please provide tubing for oxygen delivery. 1 each 99  . Multiple Vitamin (MULTIVITAMIN WITH MINERALS) TABS tablet Take 1 tablet by mouth daily.    . mupirocin ointment (BACTROBAN) 2 % Apply 1 application topically See admin instructions. 2 to 3 times daily    . omeprazole (PRILOSEC) 20 MG capsule Take 20 mg by mouth daily.    . ondansetron (ZOFRAN-ODT) 4 MG disintegrating tablet Take 4 mg by mouth every 8 (eight) hours as needed for nausea or vomiting.     . Oxycodone HCl 10 MG TABS Take 10 mg by mouth 4 (four) times daily as needed.    . potassium chloride (MICRO-K) 10 MEQ CR capsule Take 10 mEq by mouth 2 (two) times daily.     . prochlorperazine (COMPAZINE) 10 MG tablet Take 10 mg by mouth every 6 (six) hours as needed for nausea or vomiting.     . senna (SENOKOT) 8.6 MG tablet Take 1 tablet by mouth as needed.    . valACYclovir (VALTREX) 1000 MG tablet Take 1,000 mg by mouth 3 (three) times daily.    . Vitamin D, Ergocalciferol, (DRISDOL) 50000 units CAPS capsule Take 1 capsule (50,000 Units total) by mouth every 7 (seven) days. 12 capsule 0  . folic acid (FOLVITE) 1 MG tablet Take 1 tablet (1 mg total) by mouth daily. 90 tablet 4   No current facility-administered medications for this visit.    ALLERGIES:  Allergies  Allergen Reactions  . Oxycontin [Oxycodone] Other (See Comments)    Hallucinations    . Quinine Derivatives Nausea And Vomiting  . Sulfa Antibiotics Nausea And Vomiting  . Barium-Containing Compounds   . Percocet [Oxycodone-Acetaminophen] Other (See Comments)    Headahces    PHYSICAL EXAM:  Performance status (ECOG): 2 - Symptomatic, <50% confined to bed  Vitals:   03/08/20 1625  BP: (!) 144/84  Pulse: 94  Resp: 18  Temp: (!) 96.8 F (36 C)   Wt Readings from Last 3 Encounters:  03/08/20 164 lb 5 oz (74.5 kg)  02/22/20 172 lb (78 kg)  02/09/20 172 lb 11.2 oz (78.3 kg)   Physical Exam Vitals reviewed.  Constitutional:      Appearance: Normal appearance.     Comments: In wheelchair  Neurological:     General: No focal deficit present.     Mental Status: She is alert and oriented to person, place, and time.  Psychiatric:        Mood and Affect: Mood normal.        Behavior: Behavior normal.      LABORATORY DATA:  I have reviewed the labs as listed.  CBC Latest Ref Rng & Units 01/24/2020 12/27/2019 12/01/2019  WBC 4.0 - 10.5 K/uL 7.5 11.5(H) 8.2  Hemoglobin 12.0 - 15.0 g/dL 9.4(L) 9.4(L) 8.6(L)  Hematocrit 36.0 - 46.0 % 31.1(L) 30.0(L) 28.2(L)  Platelets 150 - 400 K/uL 88(L) 242 277   CMP Latest Ref Rng & Units 03/06/2020 01/24/2020 12/27/2019  Glucose 70 - 99 mg/dL - 99 134(H)  BUN 8 - 23 mg/dL - 26(H) 15  Creatinine 0.44 - 1.00 mg/dL 1.40(H) 1.03(H) 1.30(H)  Sodium 135 - 145 mmol/L - 136 135  Potassium 3.5 -  5.1 mmol/L - 4.1 3.4(L)  Chloride 98 - 111 mmol/L - 100 100  CO2 22 - 32 mmol/L - 25 20(L)  Calcium 8.9 - 10.3 mg/dL - 8.6(L) 9.3  Total Protein 6.5 - 8.1 g/dL - 6.5 -  Total Bilirubin 0.3 - 1.2 mg/dL - 0.4 -  Alkaline Phos 38 - 126 U/L - 128(H) -  AST 15 - 41 U/L - 50(H) -  ALT 0 - 44 U/L - 38 -    DIAGNOSTIC IMAGING:  I have independently reviewed the scans and discussed with the patient. MR Brain W Wo Contrast  Result Date: 03/06/2020 CLINICAL DATA:  Non-small cell lung cancer metastatic disease. Assess treatment response. EXAM: MRI  HEAD WITHOUT AND WITH CONTRAST TECHNIQUE: Multiplanar, multiecho pulse sequences of the brain and surrounding structures were obtained without and with intravenous contrast. CONTRAST:  25m GADAVIST GADOBUTROL 1 MMOL/ML IV SOLN COMPARISON:  MRI 10/11/2019. FINDINGS: Brain: Enhancing intracranial lesions have increased in size, including: 1.7 x 1.5 cm right frontal lesion, similar in size inferiorly but slightly increased in size superiorly (series 14: Image 30). 9 mm inferior right lateral frontal lobe lesion, previously 5 mm (14:29). 9 mm left frontal operculum lesion, previously 3 mm (14:35). 1.9 x 2.2 cm left occipital lesion (14:30), which now extends to the posterior margin of the left lateral ventricle with mild enhancement of the ependyma. Previously 1.5 x 1.3 cm. 11 mm left occipital lesion (14:27), previously 5 mm. As before, many of these lesions are necrotic centrally. There is increased associated/surrounding vasogenic edema associated with these lesions. No midline shift. Basal cisterns are patent. No hydrocephalus. No evidence of acute hemorrhage. No extra-axial fluid collection. Additional scattered T2/FLAIR hyperintensities within the white matter likely represent the sequela of chronic microvascular ischemic disease. No new lesions identified. Vascular: Major arterial flow voids are maintained at the skull base. Skull and upper cervical spine: Normal marrow signal. Sinuses/Orbits: Mild scattered paranasal sinus mucosal thickening without air-fluid levels. Unremarkable orbits. Other: Trace bilateral mastoid effusions. IMPRESSION: 1. Findings concerning for progression with interval increase in size of multifocal supratentorial metastases with increased surrounding vasogenic edema, as detailed above. Of note, the larger left occipital lesion now extends to the left lateral ventricle margin with mild enhancement of the ependyma, concerning for early ependymal involvement. 2. No new lesions identified.  Electronically Signed   By: FMargaretha SheffieldMD   On: 03/06/2020 13:06   CT CHEST ABDOMEN PELVIS W CONTRAST  Result Date: 03/06/2020 CLINICAL DATA:  Metastatic non-small cell lung cancer, assess treatment response EXAM: CT CHEST, ABDOMEN, AND PELVIS WITH CONTRAST TECHNIQUE: Multidetector CT imaging of the chest, abdomen and pelvis was performed following the standard protocol during bolus administration of intravenous contrast. CONTRAST:  832mOMNIPAQUE IOHEXOL 300 MG/ML SOLN, additional oral enteric contrast COMPARISON:  12/20/2019 FINDINGS: CT CHEST FINDINGS Cardiovascular: Aortic atherosclerosis. Right chest port catheter. Normal heart size. No pericardial effusion. Left and right coronary artery calcifications. Enlargement of the main pulmonary artery measuring up to 3.7 cm in caliber. Mediastinum/Nodes: Interval enlargement of pretracheal lymph node measuring up to 1.9 x 1.7 cm, previously 1.6 x 1.3 cm (series 2, image 18). Unchanged post treatment appearance of soft tissue about the left hilum. Thyroid gland, trachea, and esophagus demonstrate no significant findings. Lungs/Pleura: Moderate centrilobular emphysema. Redemonstrated postoperative findings of left upper lobectomy with post treatment fibrosis about the hilum of the left lung. Unchanged trace, loculated left pleural effusion. There is a new 4 mm subpleural nodule of the dependent left  lower lobe (series 3, image 69). Musculoskeletal: No chest wall mass or suspicious bone lesions identified. CT ABDOMEN PELVIS FINDINGS Hepatobiliary: Interval enlargement of multiple bulky hypodense liver lesions, dominant mass of the inferior right lobe of the liver, hepatic segment VI, measuring 10.3 x 6.9 cm, previously 9.3 x 6.3 cm. An additional index lesion in the posterior left lobe of the liver measures 3.4 x 3.3 cm, previously 2.2 x 1.8 cm (series 2, image 47). No gallstones, gallbladder wall thickening, or biliary dilatation. Pancreas: Unremarkable. No  pancreatic ductal dilatation or surrounding inflammatory changes. Spleen: Normal in size without significant abnormality. Adrenals/Urinary Tract: Adrenal glands are unremarkable. Asymmetrically atrophic left kidney. The right kidney is normal, without renal calculi, solid lesion, or hydronephrosis. Bladder is unremarkable. Stomach/Bowel: Stomach is within normal limits. Appendix appears diminutive although normal. No evidence of bowel wall thickening, distention, or inflammatory changes. Generally large burden of stool throughout the colon and rectum. Vascular/Lymphatic: Aortic atherosclerosis. No significant change in matted appearing portacaval, celiac axis, and aortocaval nodes. A single inferior aortocaval node is enlarged compared to prior examination measuring up to 1.1 x 0.9 cm, previously 0.6 x 0.6 cm (series 2, image 77). Reproductive: Status post hysterectomy. Other: No abdominal wall hernia or abnormality. No abdominopelvic ascites. Musculoskeletal: No acute or significant osseous findings. IMPRESSION: 1. Unchanged postoperative and post treatment appearance of the left chest, status post left upper lobectomy. 2. Interval enlargement of pretracheal lymph node. 3. Interval enlargement of multiple bulky hypodense liver lesions, consistent with worsened hepatic metastatic disease. 4. A single inferior aortocaval lymph node is enlarged compared to prior examination, with otherwise unchanged appearance of matted appearing portacaval, celiac axis, and superior aortocaval lymph nodes. 5. Constellation of findings is consistent with worsened metastatic disease. 6. New 4 mm subpleural nodule of the dependent left lower lobe, nonspecific. Attention on follow-up. 7. Emphysema. 8. Coronary artery disease. Aortic Atherosclerosis (ICD10-I70.0) and Emphysema (ICD10-J43.9). Electronically Signed   By: Eddie Candle M.D.   On: 03/06/2020 13:41     ASSESSMENT:  1. Metastatic non-small cell lung cancer: -History of  stage IIIa (T3 N1 M0) non-small cell lung cancer presented with 2 masses in the left upper lobe as well as questionable left hilar adenopathy diagnosed in December 2018. -4 cycles of platinum and pemetrexed from 04/02/2017 through 06/08/2017 -Left upper lobectomy with en blocLLLsuperior segmentectomy on 07/08/2017, pathology showing invasive adenocarcinoma, moderately to poorly differentiated with focal treatment effect and necrosis, 4.3 cm (mass #1) and invasive squamous cell carcinoma, moderately to poorly differentiated, 3.1 cm (mass #2), margins negative, YPT3Y PN 1. -PET scan on 08/04/2018 showed hypermetabolic right apical and retroaortic postradiation changes, intensely hypermetabolic subpleural spiculated nodules/correlating mass in the superior segment of the left lower lobe suspicious for locoregional disease recurrence. Small left pleural effusion demonstrated mild FDG activity. Right hepatic lobe segment 5 hypodensity with intense hypermetabolic activity. Hypermetabolic and markedly enlarged porta hepatis lymph nodes intimately related to the caudate lobe. -MRI of the brain on 09/16/2018 shows 2 ring-enhancing lesions in the parasagittal right frontal lobe and long the left parieto-occipital sulcus measuring 1.7 and 1.3 cm respectively. -4 cycles of carbo/Pem/Pembro from 09/20/2018 through 12/06/2018 -Pembrolizumab maintenance from 01/03/2019 through 04/25/2019, with flare of rheumatoid arthritis. -Started on Remicade on 05/27/2019. -She was switched to pemetrexed maintenance on 06/27/2019. -She moved to [facility]. She was due for her pemetrexed on 08/08/2019. -She walks with the help of her walker and does very minimal household work. She needs assistance taking shower and clothing herself. -  She has tolerated pemetrexed except some tiredness and decreased appetite lasting 2 to 3 days after each treatment. -CT CAP on 09/02/2019 shows masslike consolidation in the left lower lobe measuring 5.3 x  2.2 cm. Right hepatic lobe lesion measures 5.7 x 4.6 cm. Additional 1.6 x 1.3 low-attenuation lesion medial posterior right hepatic lobe. Enlarged porta hepatic and preaortic lymph nodes. Left hemisacral insufficiency fracture. Fracture through the left superior and inferior pubic rami. Wedge compression deformity of T4 vertebral body.  2. Brain metastasis: -Underwent SRS to the brain meta stasis in the left temporal lobe, right frontal and left frontal lobes. -Last MRI of the brain was on 06/15/2019. -Brain MRI on 10/11/2019 shows 1.7 x 1.5 cm inferior right frontal lesion, 1.5 x 1.3 cm medial left occipital lesion, 5 mm inferior right frontal lesion, 5 mm left occipital lesion, 3 mm left frontal operculum lesion.  3. Family history: -Maternal grandmother had colon cancer. -Maternal aunt had throat cancer. -Mother had breast cancer. -Oldest son died of pancreatic cancer.   PLAN:  1. Metastatic non-small cell lung cancer: -We have reviewed CT CAP from 03/06/2020 which showed unchanged postoperative and posttreatment appearance of the left chest, status post left upper lobectomy.  Interval enlargement of the pretracheal lymph node.  Enlargement of bulky hypodense liver lesions, largest in the right lobe measuring 10.3 x 6.9 cm, previously 9.3 x 6.3 cm.  Additional lesion in the left lobe of the liver measures 3.4 x 3.3 cm, previously 2.2 x 1.8 cm.  Aortocaval node is enlarged at 1.1 x 0.9 cm, previously 0.6 x 0.6 cm. -We have previously talked about palliative care with hospice.  However she would like to receive treatments until she sees her great grandson. -Guardant 360 did not show any targetable mutations.  PD-L1 0%. -We will make a referral to radiation oncology. -We will see her back in 4 weeks for follow-up.  2. Brain metastasis: -MRI of the brain on 03/06/2020 showed 1.7 x 1.5 cm right frontal lesion, similar in size inferiorly but slightly increased in size superiorly.  9 mm  inferior right lateral frontal lesion, previously 5 mm.  9 mm left frontal opercular lesion, previously 3 mm.  1.9 x 2.2 cm left occipital lesion now extends to the posterior margin of the left lateral ventricle with mild enhancement of the parenchyma, previously 1.5 x 1.3 cm.  11 mm left occipital lesion, previously 5 mm.  There is slight associated surrounding vasogenic edema.  No midline shift.  No new lesions seen. -I have recommended follow-up with radiation oncology. -We will start her on dexamethasone 2 mg in the mornings for 10 days which will also help with her appetite.  3. Family history: -[facility] testing showed CFTR heterozygosity.  4. Rheumatoid arthritis: -Treatment on hold at this time.  5. Constipation: -Continue lactulose as needed.  6.  Weight loss: -She lost about 8 pounds since last visit.  She reports that she cut back on sodas and junk food. -She also came off of dexamethasone. -I have recommended nutritional supplements daily.   Orders placed this encounter:  No orders of the defined types were placed in this encounter.    Derek Jack, MD [place] [phone]   I, Milinda Antis, am acting as a scribe for Dr. Sanda Linger.  I, Derek Jack MD, have reviewed the above documentation for accuracy and completeness, and I agree with the above.

## 2020-03-09 ENCOUNTER — Other Ambulatory Visit: Payer: Self-pay | Admitting: Radiation Therapy

## 2020-03-12 ENCOUNTER — Inpatient Hospital Stay: Payer: Medicare Other | Attending: Radiation Oncology

## 2020-03-12 NOTE — Progress Notes (Signed)
error 

## 2020-03-13 ENCOUNTER — Ambulatory Visit
Admission: RE | Admit: 2020-03-13 | Discharge: 2020-03-13 | Disposition: A | Discharge status: Home / Self Care | Payer: Medicare Other | Source: Ambulatory Visit | Attending: Radiation Oncology | Admitting: Radiation Oncology

## 2020-03-13 ENCOUNTER — Ambulatory Visit: Payer: Medicare Other

## 2020-03-14 NOTE — Progress Notes (Signed)
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   Rose Lutz presented for routine follow up of Lung cancer with mets.   MRI Brain 03/06/2020: Enhancing intracranial lesions have increased in size, Including:  1.7 x 1.5 cm right frontal lesion, similar in size inferiorly but slightly increased in size superiorly (series 14: Image 30).  9 mm inferior right lateral frontal lobe lesion, previously 5 mm (14:29).  9 mm left frontal operculum lesion, previously 3 mm (14:35).  1.9 x 2.2 cm left occipital lesion (14:30), which now extends to the posterior margin of the left lateral ventricle with mild enhancement of the ependyma. Previously 1.5 x 1.3 cm.  11 mm left occipital lesion (14:27), previously 5 mm.    CT CAP 03/06/2020: unchanged postoperative and posttreatment appearance of the left chest, status post left upper lobectomy.  Interval enlargement of the pretracheal lymph node.  Enlargement of bulky hypodense liver lesions, largest in the right lobe measuring 10.3 x 6.9 cm, previously 9.3 x 6.3 cm.  Additional lesion in the left lobe of the liver measures 3.4 x 3.3 cm, previously 2.2 x 1.8 cm.  Aortocaval node is enlarged at 1.1 x 0.9 cm, previously 0.6 x 0.6 cm.   Biopsies of    Past/Anticipated interventions by surgeon, if any:  [name] -Thoracotomy with lobectomy and segmentectomy, mediastinal node dissection    Past/Anticipated interventions by medical oncology, if any: [name] 03/08/2020 -Presented in December 2018 with non small cell lung cancer. -Left upper lobectomy with en bloc LLL superior segmentectomy 07/08/2017. -Left lower lobe local regional disease recurrence. July 2020. -08/2018 MRI brain showed 2 ring enhancing lesions. -Last chemotherapy 11/09/2019 -We have previously talked about palliative care with hospice, however she would like to receive treatments until she sees her great grandson.  She would like to travel to [place]  once she is stronger.     Recent neurologic symptoms, if any:   Seizures: No  Headaches: No  Nausea: Has nausea daily.  Dizziness/ataxia: No  Difficulty with hand coordination: No  Focal numbness/weakness: Bilateral legs weak.  Visual deficits/changes: No  Confusion/Memory deficits: No   Weight changes, if any: Steady, lost a couple of pounds.   Bowel/Bladder complaints, if any: Bowels are better when she eats.   Nausea / Vomiting, if any: Nausea, daily, mostly all day.   Pain issues, if any:  Right side (flank) pain.   Appetite: Appetite has been poor for about a month. She is trying to get in fluids.  She is drinking water, tea, ensure (1).     SAFETY ISSUES:  Prior radiation? SRS left temporal lobe, right and left frontal lobes.  Pacemaker/ICD? No  Possible current pregnancy? Hysterectomy  Is the patient on methotrexate? No   Current Complaints/Details: -Port Insertion

## 2020-03-15 ENCOUNTER — Ambulatory Visit
Admission: RE | Admit: 2020-03-15 | Discharge: 2020-03-15 | Disposition: A | Discharge status: Home / Self Care | Payer: Medicare Other | Source: Ambulatory Visit | Attending: Radiation Oncology | Admitting: Radiation Oncology

## 2020-03-15 ENCOUNTER — Encounter: Payer: Self-pay | Admitting: Radiation Oncology

## 2020-03-15 ENCOUNTER — Other Ambulatory Visit: Payer: Self-pay

## 2020-03-15 VITALS — BP 100/74 | HR 115 | Temp 97.7°F | Resp 24 | Ht 64.0 in | Wt 164.0 lb

## 2020-03-15 DIAGNOSIS — M1990 Unspecified osteoarthritis, unspecified site: Secondary | ICD-10-CM | POA: Diagnosis not present

## 2020-03-15 DIAGNOSIS — I10 Essential (primary) hypertension: Secondary | ICD-10-CM | POA: Insufficient documentation

## 2020-03-15 DIAGNOSIS — Z90722 Acquired absence of ovaries, bilateral: Secondary | ICD-10-CM | POA: Diagnosis not present

## 2020-03-15 DIAGNOSIS — G8929 Other chronic pain: Secondary | ICD-10-CM | POA: Insufficient documentation

## 2020-03-15 DIAGNOSIS — F329 Major depressive disorder, single episode, unspecified: Secondary | ICD-10-CM | POA: Insufficient documentation

## 2020-03-15 DIAGNOSIS — C3412 Malignant neoplasm of upper lobe, left bronchus or lung: Secondary | ICD-10-CM | POA: Diagnosis not present

## 2020-03-15 DIAGNOSIS — Z9119 Patient's noncompliance with other medical treatment and regimen: Secondary | ICD-10-CM | POA: Diagnosis not present

## 2020-03-15 DIAGNOSIS — Z800 Family history of malignant neoplasm of digestive organs: Secondary | ICD-10-CM | POA: Insufficient documentation

## 2020-03-15 DIAGNOSIS — C787 Secondary malignant neoplasm of liver and intrahepatic bile duct: Secondary | ICD-10-CM | POA: Insufficient documentation

## 2020-03-15 DIAGNOSIS — C7931 Secondary malignant neoplasm of brain: Secondary | ICD-10-CM | POA: Diagnosis not present

## 2020-03-15 DIAGNOSIS — Z803 Family history of malignant neoplasm of breast: Secondary | ICD-10-CM | POA: Insufficient documentation

## 2020-03-15 DIAGNOSIS — Z923 Personal history of irradiation: Secondary | ICD-10-CM | POA: Diagnosis not present

## 2020-03-15 DIAGNOSIS — K219 Gastro-esophageal reflux disease without esophagitis: Secondary | ICD-10-CM | POA: Diagnosis not present

## 2020-03-15 DIAGNOSIS — Z79899 Other long term (current) drug therapy: Secondary | ICD-10-CM | POA: Diagnosis not present

## 2020-03-15 DIAGNOSIS — Z87891 Personal history of nicotine dependence: Secondary | ICD-10-CM | POA: Diagnosis not present

## 2020-03-15 DIAGNOSIS — Z9221 Personal history of antineoplastic chemotherapy: Secondary | ICD-10-CM | POA: Diagnosis not present

## 2020-03-15 DIAGNOSIS — I2510 Atherosclerotic heart disease of native coronary artery without angina pectoris: Secondary | ICD-10-CM | POA: Diagnosis not present

## 2020-03-15 DIAGNOSIS — I700 Atherosclerosis of aorta: Secondary | ICD-10-CM | POA: Diagnosis not present

## 2020-03-15 DIAGNOSIS — Z90710 Acquired absence of both cervix and uterus: Secondary | ICD-10-CM | POA: Insufficient documentation

## 2020-03-15 DIAGNOSIS — M069 Rheumatoid arthritis, unspecified: Secondary | ICD-10-CM | POA: Insufficient documentation

## 2020-03-15 NOTE — Progress Notes (Signed)
Radiation Oncology         [phone] ________________________________  Name: Rose Lutz        MRN: [id number]  Date of Service: 03/15/2020 DOB: September 13, 1950  [id number], Hulen Shouts, PA-C  Derek Jack, MD     REFERRING PHYSICIAN: Derek Jack, MD   DIAGNOSIS: The primary encounter diagnosis was Secondary malignant neoplasm of brain [facility]). Diagnoses of Malignant neoplasm of upper lobe of left lung [facility]), Primary malignant neoplasm of left upper lobe of lung ([place]), and Brain metastases ([place]) were also pertinent to this visit.   HISTORY OF PRESENT ILLNESS: Rose Lutz is a 70 y.o. female seen at the request of [name] for a history of recurrent metastatic non small cell carcinoma of the lung. She was last seen in clinic in January 2019 as she was contemplating where she was going to proceed with treatment, she had been diagnosed with a non-small cell lung cancer favoring adenocarcinoma in the left upper lobe of the lung, she did elect to proceed with treatment at [facility] and received 4 cycles of platinum and pemetrexed between March and May 2019 followed by left upper lobectomy with en bloc left lower lobe superior segmentectomy on 07/08/2017 interestingly her pathology revealed a 4.3 cm adenocarcinoma that was moderate to poorly differentiated as well as a second 3.1 cm invasive squamous cell carcinoma that was moderately to poorly differentiated.  She had nodal involvement at that time and was followed in surveillance until imaging in July 2020 showed concerns for left lower lobe local regional disease recurrence.  She began chemo/immunotherapy. In May 2021, MRI of the brain showed 2 ring-enhancing lesions measuring 1.7 and 1.3 cm in the right frontal lobe and left parieto-occipital sulcus. She received fractionated radiotherapy to two lesions, and single fraction treatment to three other sites.  She continued on pembrolizumab maintenance until April 2021 with  a flare of her rheumatoid arthritis and was started on Remicade on 05/27/2019, she switched back to pemetrexed maintenance on 06/27/2019 and relocated to [place] prior to her next dose, and her last was on 11/09/2019. a CT CAP on 03/06/20 showed an interval increase in a pretracheal lymph node and enlargement of bulky hypodense liver lesions the largest in the right lower lobe measuring 10.3 cm, a 3.4 cm leave and lesion in the left lobe, and has been offered palliative care/hospice but has declined as she is hoping to see a grandchild who is still in the NICU.  She did have an MRI of the brain also on 03/06/2020 showing what sounds like stability and posttreatment effect in previously treated brain disease.  She is seen today to discuss the possibility of additional radiotherapy.    PREVIOUS RADIATION THERAPY: Yes   06/30/19-07/05/19 SRS Treatment at [facility] #1: PTVs 2-3 Technique: Cyberknife SRT [employer]: x06 Prescribed Dose: 2400.00 cGy, 800.00 cGy per fraction in 3 fractions delivered 1x per day to the 72.0% Isodose line Actual Received Dose: 2400.00 cGy, 800.00 cGy per day in 3 fractions delivered 1x per day to the 72.0% Isodose line Treatment Dates: 06/30/2019 through 07/04/2019  Treatment Area #2: PTVs 1, 4-5 Technique: Cyberknife SRS Energy: x06 Prescribed Dose: 2000.00 cGy, 2000.00 cGy per fraction in 1 fractions delivered 1x per day to the 64.0% Isodose line Actual Received Dose: 2000.00 cGy, 2000.00 cGy per day in 1 fractions delivered 1x per day to the 64.0% Isodose line Treatment Dates: 07/05/2019 through 07/05/2019    PAST MEDICAL HISTORY:  Past Medical History:  Diagnosis Date  . Chronic pain   . Depression   . Essential hypertension   . Family history of breast cancer   . Family history of colon cancer   . Family history of pancreatic cancer   . GERD (gastroesophageal reflux disease)   . History of lung cancer 06/2017  . Osteoarthritis   . Pulmonary  asbestosis ([place])   . Rheumatoid arthritis ([place])        PAST SURGICAL HISTORY: Past Surgical History:  Procedure Laterality Date  . ABDOMINAL HYSTERECTOMY    . BILATERAL OOPHORECTOMY    . CESAREAN SECTION     x3  . FEMUR FRACTURE SURGERY Left 2021  . KNEE ARTHROPLASTY Right   . LUNG CANCER SURGERY Left 07/08/2017  . pelvis fracture/surgery     Dr. Cheri Rous  . PORTACATH PLACEMENT Right 09/05/2019   Procedure: INSERTION PORT-A-CATH;  Surgeon: Aviva Signs, MD;  Location: [facility];  Service: General;  Laterality: Right;  . TONSILLECTOMY    . VIDEO BRONCHOSCOPY WITH ENDOBRONCHIAL NAVIGATION Left 12/31/2016   Procedure: VIDEO BRONCHOSCOPY WITH ENDOBRONCHIAL NAVIGATION, left upper lung lobe;  Surgeon: Collene Gobble, MD;  Location: [facility];  Service: Thoracic;  Laterality: Left;  . YAG LASER APPLICATION Right 09/38/1829   Procedure: YAG LASER APPLICATION;  Surgeon: Rutherford Guys, MD;  Location: [facility];  Service: Ophthalmology;  Laterality: Right;  . YAG LASER APPLICATION Left 93/71/6967   Procedure: YAG LASER APPLICATION;  Surgeon: Rutherford Guys, MD;  Location: [facility];  Service: Ophthalmology;  Laterality: Left;     FAMILY HISTORY:  Family History  Problem Relation Age of Onset  . Breast cancer Mother 29       d. 58  . Colon cancer Maternal Grandmother 1       d. 17  . Pancreatic cancer Son 82  . Healthy Father   . Arthritis Sister   . Arthritis Paternal Grandmother   . Other Son 25       MVA  . Esophageal cancer Maternal Aunt      SOCIAL HISTORY:  reports that she quit smoking about 14 years ago. Her smoking use included cigarettes. She started smoking about 52 years ago. She has a 47.50 pack-year smoking history. She has never used smokeless tobacco. She reports that she does not drink alcohol and does not use drugs. The patient is married and lives in [place] but is from the [place] area and plans to move back there if she proceeds with hospice care.    ALLERGIES: Oxycontin  [oxycodone], Quinine derivatives, Sulfa antibiotics, Barium-containing compounds, and Percocet [oxycodone-acetaminophen]   MEDICATIONS:  Current Outpatient Medications  Medication Sig Dispense Refill  . alendronate (FOSAMAX) 35 MG tablet Take 35 mg by mouth every 7 (seven) days.    Marland Kitchen amitriptyline (ELAVIL) 100 MG tablet Take 100 mg by mouth at bedtime.    . Ascorbic Acid (VITAMIN C) 250 MG CHEW Chew 250 mg by mouth daily.     . cetirizine (ZYRTEC) 10 MG tablet Take 10 mg by mouth daily as needed for allergies.     Marland Kitchen dexamethasone (DECADRON) 2 MG tablet Take 1 tablet (2 mg total) by mouth daily. 14 tablet 0  . dexamethasone (DECADRON) 4 MG tablet Take 1 tablet (4 mg total) by mouth 4 (four) times daily. 120 tablet 0  . diphenhydrAMINE (BENADRYL) 25 mg capsule Take 25 mg by mouth every 6 (six) hours as needed for itching or allergies.     Marland Kitchen gabapentin (NEURONTIN) 100 MG capsule  Take 100 mg by mouth 3 (three) times daily.   5  . hydrALAZINE (APRESOLINE) 25 MG tablet Take 25 mg by mouth daily.     Marland Kitchen HYDROcodone-acetaminophen (NORCO) 10-325 MG tablet Take 2 tablets by mouth every 6 (six) hours as needed for severe pain.     Marland Kitchen losartan-hydrochlorothiazide (HYZAAR) 50-12.5 MG tablet Take 1 tablet by mouth daily.    . metoprolol succinate (TOPROL-XL) 50 MG 24 hr tablet Take 50 mg by mouth daily.    . Misc. Devices MISC Please provide patient with oxygen via nasal cannula at 2 liters per minute continuous.  Please provide tubing for oxygen delivery. 1 each 99  . Multiple Vitamin (MULTIVITAMIN WITH MINERALS) TABS tablet Take 1 tablet by mouth daily.    . mupirocin ointment (BACTROBAN) 2 % Apply 1 application topically See admin instructions. 2 to 3 times daily    . omeprazole (PRILOSEC) 20 MG capsule Take 20 mg by mouth daily.    . ondansetron (ZOFRAN-ODT) 4 MG disintegrating tablet Take 4 mg by mouth every 8 (eight) hours as needed for nausea or vomiting.     . Oxycodone HCl 10 MG TABS Take 10 mg by  mouth 4 (four) times daily as needed.    . potassium chloride (MICRO-K) 10 MEQ CR capsule Take 10 mEq by mouth 2 (two) times daily.     . prochlorperazine (COMPAZINE) 10 MG tablet Take 10 mg by mouth every 6 (six) hours as needed for nausea or vomiting.     . senna (SENOKOT) 8.6 MG tablet Take 1 tablet by mouth as needed.    . valACYclovir (VALTREX) 1000 MG tablet Take 1,000 mg by mouth 3 (three) times daily.    . Vitamin D, Ergocalciferol, (DRISDOL) 50000 units CAPS capsule Take 1 capsule (50,000 Units total) by mouth every 7 (seven) days. 12 capsule 0  . folic acid (FOLVITE) 1 MG tablet Take 1 tablet (1 mg total) by mouth daily. 90 tablet 4   No current facility-administered medications for this encounter.     REVIEW OF SYSTEMS: On review of systems, the patient reports that she is doing okay. She is not taking steroids as prescribed, but has transient headaches. She denies any speech, visual, or movement dysfunction. She has some pain over her RUQ along the site where she has known liver disease. No other complaints are verbalized.     PHYSICAL EXAM:  Wt Readings from Last 3 Encounters:  03/15/20 164 lb (74.4 kg)  03/08/20 164 lb 5 oz (74.5 kg)  02/22/20 172 lb (78 kg)   Temp Readings from Last 3 Encounters:  03/15/20 97.7 F (36.5 C)  03/08/20 (!) 96.8 F (36 C)  02/09/20 (!) 96.6 F (35.9 C) (Temporal)   BP Readings from Last 3 Encounters:  03/15/20 100/74  03/08/20 (!) 144/84  02/22/20 (!) 141/89   Pulse Readings from Last 3 Encounters:  03/15/20 (!) 115  03/08/20 94  02/22/20 85   Pain Assessment Pain Score: 6  Pain Loc:  (Right side)/10  In general this is a well appearing caucasian female in no acute distress. She's alert and oriented x4 and appropriate throughout the examination. Cardiopulmonary assessment is negative for acute distress and she exhibits normal effort.     ECOG = 1  0 - Asymptomatic (Fully active, able to carry on all predisease activities  without restriction)  1 - Symptomatic but completely ambulatory (Restricted in physically strenuous activity but ambulatory and able to carry out work of a  light or sedentary nature. For example, light housework, office work)  2 - Symptomatic, <50% in bed during the day (Ambulatory and capable of all self care but unable to carry out any work activities. Up and about more than 50% of waking hours)  3 - Symptomatic, >50% in bed, but not bedbound (Capable of only limited self-care, confined to bed or chair 50% or more of waking hours)  4 - Bedbound (Completely disabled. Cannot carry on any self-care. Totally confined to bed or chair)  5 - Death   Eustace Pen MM, Creech RH, Tormey DC, et al. [phone]). "Toxicity and response criteria of the [facility]". [place] Oncol. 5 (6): 649-55    LABORATORY DATA:  Lab Results  Component Value Date   WBC 7.5 01/24/2020   HGB 9.4 (L) 01/24/2020   HCT 31.1 (L) 01/24/2020   MCV 112.3 (H) 01/24/2020   PLT 88 (L) 01/24/2020   Lab Results  Component Value Date   NA 136 01/24/2020   K 4.1 01/24/2020   CL 100 01/24/2020   CO2 25 01/24/2020   Lab Results  Component Value Date   ALT 38 01/24/2020   AST 50 (H) 01/24/2020   ALKPHOS 128 (H) 01/24/2020   BILITOT 0.4 01/24/2020      RADIOGRAPHY: MR Brain W Wo Contrast  Result Date: 03/06/2020 CLINICAL DATA:  Non-small cell lung cancer metastatic disease. Assess treatment response. EXAM: MRI HEAD WITHOUT AND WITH CONTRAST TECHNIQUE: Multiplanar, multiecho pulse sequences of the brain and surrounding structures were obtained without and with intravenous contrast. CONTRAST:  51mL GADAVIST GADOBUTROL 1 MMOL/ML IV SOLN COMPARISON:  MRI 10/11/2019. FINDINGS: Brain: Enhancing intracranial lesions have increased in size, including: 1.7 x 1.5 cm right frontal lesion, similar in size inferiorly but slightly increased in size superiorly (series 14: Image 30). 9 mm inferior right lateral frontal  lobe lesion, previously 5 mm (14:29). 9 mm left frontal operculum lesion, previously 3 mm (14:35). 1.9 x 2.2 cm left occipital lesion (14:30), which now extends to the posterior margin of the left lateral ventricle with mild enhancement of the ependyma. Previously 1.5 x 1.3 cm. 11 mm left occipital lesion (14:27), previously 5 mm. As before, many of these lesions are necrotic centrally. There is increased associated/surrounding vasogenic edema associated with these lesions. No midline shift. Basal cisterns are patent. No hydrocephalus. No evidence of acute hemorrhage. No extra-axial fluid collection. Additional scattered T2/FLAIR hyperintensities within the white matter likely represent the sequela of chronic microvascular ischemic disease. No new lesions identified. Vascular: Major arterial flow voids are maintained at the skull base. Skull and upper cervical spine: Normal marrow signal. Sinuses/Orbits: Mild scattered paranasal sinus mucosal thickening without air-fluid levels. Unremarkable orbits. Other: Trace bilateral mastoid effusions. IMPRESSION: 1. Findings concerning for progression with interval increase in size of multifocal supratentorial metastases with increased surrounding vasogenic edema, as detailed above. Of note, the larger left occipital lesion now extends to the left lateral ventricle margin with mild enhancement of the ependyma, concerning for early ependymal involvement. 2. No new lesions identified. Electronically Signed   By: Margaretha Sheffield MD   On: 03/06/2020 13:06   CT CHEST ABDOMEN PELVIS W CONTRAST  Result Date: 03/06/2020 CLINICAL DATA:  Metastatic non-small cell lung cancer, assess treatment response EXAM: CT CHEST, ABDOMEN, AND PELVIS WITH CONTRAST TECHNIQUE: Multidetector CT imaging of the chest, abdomen and pelvis was performed following the standard protocol during bolus administration of intravenous contrast. CONTRAST:  67mL OMNIPAQUE IOHEXOL 300 MG/ML SOLN,  additional oral  enteric contrast COMPARISON:  12/20/2019 FINDINGS: CT CHEST FINDINGS Cardiovascular: Aortic atherosclerosis. Right chest port catheter. Normal heart size. No pericardial effusion. Left and right coronary artery calcifications. Enlargement of the main pulmonary artery measuring up to 3.7 cm in caliber. Mediastinum/Nodes: Interval enlargement of pretracheal lymph node measuring up to 1.9 x 1.7 cm, previously 1.6 x 1.3 cm (series 2, image 18). Unchanged post treatment appearance of soft tissue about the left hilum. Thyroid gland, trachea, and esophagus demonstrate no significant findings. Lungs/Pleura: Moderate centrilobular emphysema. Redemonstrated postoperative findings of left upper lobectomy with post treatment fibrosis about the hilum of the left lung. Unchanged trace, loculated left pleural effusion. There is a new 4 mm subpleural nodule of the dependent left lower lobe (series 3, image 69). Musculoskeletal: No chest wall mass or suspicious bone lesions identified. CT ABDOMEN PELVIS FINDINGS Hepatobiliary: Interval enlargement of multiple bulky hypodense liver lesions, dominant mass of the inferior right lobe of the liver, hepatic segment VI, measuring 10.3 x 6.9 cm, previously 9.3 x 6.3 cm. An additional index lesion in the posterior left lobe of the liver measures 3.4 x 3.3 cm, previously 2.2 x 1.8 cm (series 2, image 47). No gallstones, gallbladder wall thickening, or biliary dilatation. Pancreas: Unremarkable. No pancreatic ductal dilatation or surrounding inflammatory changes. Spleen: Normal in size without significant abnormality. Adrenals/Urinary Tract: Adrenal glands are unremarkable. Asymmetrically atrophic left kidney. The right kidney is normal, without renal calculi, solid lesion, or hydronephrosis. Bladder is unremarkable. Stomach/Bowel: Stomach is within normal limits. Appendix appears diminutive although normal. No evidence of bowel wall thickening, distention, or inflammatory changes. Generally  large burden of stool throughout the colon and rectum. Vascular/Lymphatic: Aortic atherosclerosis. No significant change in matted appearing portacaval, celiac axis, and aortocaval nodes. A single inferior aortocaval node is enlarged compared to prior examination measuring up to 1.1 x 0.9 cm, previously 0.6 x 0.6 cm (series 2, image 77). Reproductive: Status post hysterectomy. Other: No abdominal wall hernia or abnormality. No abdominopelvic ascites. Musculoskeletal: No acute or significant osseous findings. IMPRESSION: 1. Unchanged postoperative and post treatment appearance of the left chest, status post left upper lobectomy. 2. Interval enlargement of pretracheal lymph node. 3. Interval enlargement of multiple bulky hypodense liver lesions, consistent with worsened hepatic metastatic disease. 4. A single inferior aortocaval lymph node is enlarged compared to prior examination, with otherwise unchanged appearance of matted appearing portacaval, celiac axis, and superior aortocaval lymph nodes. 5. Constellation of findings is consistent with worsened metastatic disease. 6. New 4 mm subpleural nodule of the dependent left lower lobe, nonspecific. Attention on follow-up. 7. Emphysema. 8. Coronary artery disease. Aortic Atherosclerosis (ICD10-I70.0) and Emphysema (ICD10-J43.9). Electronically Signed   By: Eddie Candle M.D.   On: 03/06/2020 13:41       IMPRESSION/PLAN: 1. Progressive Metastatic NSCLC, favor adenocarcinoma of the LUL with brain and liver disease. Dr. Lisbeth Renshaw discusses the patient's care and course including pathology and imaging findings.  We discussed patient's recent imaging in our brain and spine conference. It appears that there may be treatment effect seen in the lesions described on her recent MRI scans. We will obtain her prior treatment planning information and pretreatment MRIs from [facility] and try to match up her treatment plan to determine if there is concern for progression versus  post treatment effect. We will reach out to her once we've had a chance to review these images. She is in agreement with this and if she elects to remain in [place], she would  be followed with repeat brain imaging surveillance in the brain oncology program. If there was progression however we could also discuss options of treatment. She is also noncompliant with her steroids at this time and we encouraged her to discontinue as she is not having severe symptoms while off steroids.    In a visit lasting 60 minutes, greater than 50% of the time was spent face to face discussing the patient's condition, in preparation for the discussion, and coordinating the patient's care.   The above documentation reflects my direct findings during this shared patient visit. Please see the separate note by Dr. Lisbeth Renshaw on this date for the remainder of the patient's plan of care.    Carola Rhine, [facility]   **Disclaimer: This note was dictated with voice recognition software. Similar sounding words can inadvertently be transcribed and this note may contain transcription errors which may not have been corrected upon publication of note.**

## 2020-03-16 ENCOUNTER — Encounter: Payer: Self-pay | Admitting: General Practice

## 2020-03-16 DIAGNOSIS — Z743 Need for continuous supervision: Secondary | ICD-10-CM | POA: Diagnosis not present

## 2020-03-16 DIAGNOSIS — I499 Cardiac arrhythmia, unspecified: Secondary | ICD-10-CM | POA: Diagnosis not present

## 2020-03-16 DIAGNOSIS — R4020 Unspecified coma: Secondary | ICD-10-CM | POA: Diagnosis not present

## 2020-03-16 DIAGNOSIS — R404 Transient alteration of awareness: Secondary | ICD-10-CM | POA: Diagnosis not present

## 2020-03-16 DIAGNOSIS — R0689 Other abnormalities of breathing: Secondary | ICD-10-CM | POA: Diagnosis not present

## 2020-03-20 ENCOUNTER — Inpatient Hospital Stay (HOSPITAL_COMMUNITY): Payer: Medicare Other | Attending: Hematology | Admitting: General Practice

## 2020-03-20 ENCOUNTER — Encounter: Payer: Self-pay | Admitting: General Practice

## 2020-03-20 NOTE — Progress Notes (Signed)
[facility] CSW Progress Notes  Call to patient to complete Distress Screen, spoke w husband.  He informed me that she died at home on Friday March 28, 2020.  Oncology team notified.  Support provided to husband.  Edwyna Shell, LCSW Clinical Social Worker Phone:  [phone]

## 2020-03-20 NOTE — Progress Notes (Signed)
[facility] Psychosocial Distress Screening Clinical Social Work  Clinical Social Work was referred by distress screening protocol.  The patient scored a 7 on the Psychosocial Distress Thermometer which indicates moderate distress. Clinical Social Worker contacted patient by phone to assess for distress and other psychosocial needs. Unable to reach, left VM w my contact information and request to call back.    ONCBCN DISTRESS SCREENING 03/15/2020  Screening Type Initial Screening  Distress experienced in past week (1-10) 7  Practical problem type   Family Problem type Partner  Emotional problem type Depression;Nervousness/Anxiety  Physical Problem type Pain;Nausea/vomiting;Getting around;Loss of appetitie  Physician notified of physical symptoms     Clinical Social Worker follow up needed: Yes.    If yes, follow up plan:  Try again to call/  Beverely Pace, [place], [place] Worker Phone:  [phone]

## 2020-03-20 DEATH — deceased

## 2020-04-10 ENCOUNTER — Ambulatory Visit (HOSPITAL_COMMUNITY): Payer: Medicare Other | Admitting: Hematology

## 2020-04-25 ENCOUNTER — Ambulatory Visit: Payer: Medicare Other | Admitting: Physician Assistant

## 2021-08-31 IMAGING — DX DG CHEST 1V PORT
1 series · 1 of 1 positions shown · non-contrast
Comparison: CT chest dated September 02, 2019.

CLINICAL DATA: Port placement.  History of lung cancer.

EXAM:
PORTABLE CHEST 1 VIEW

[chest ap]
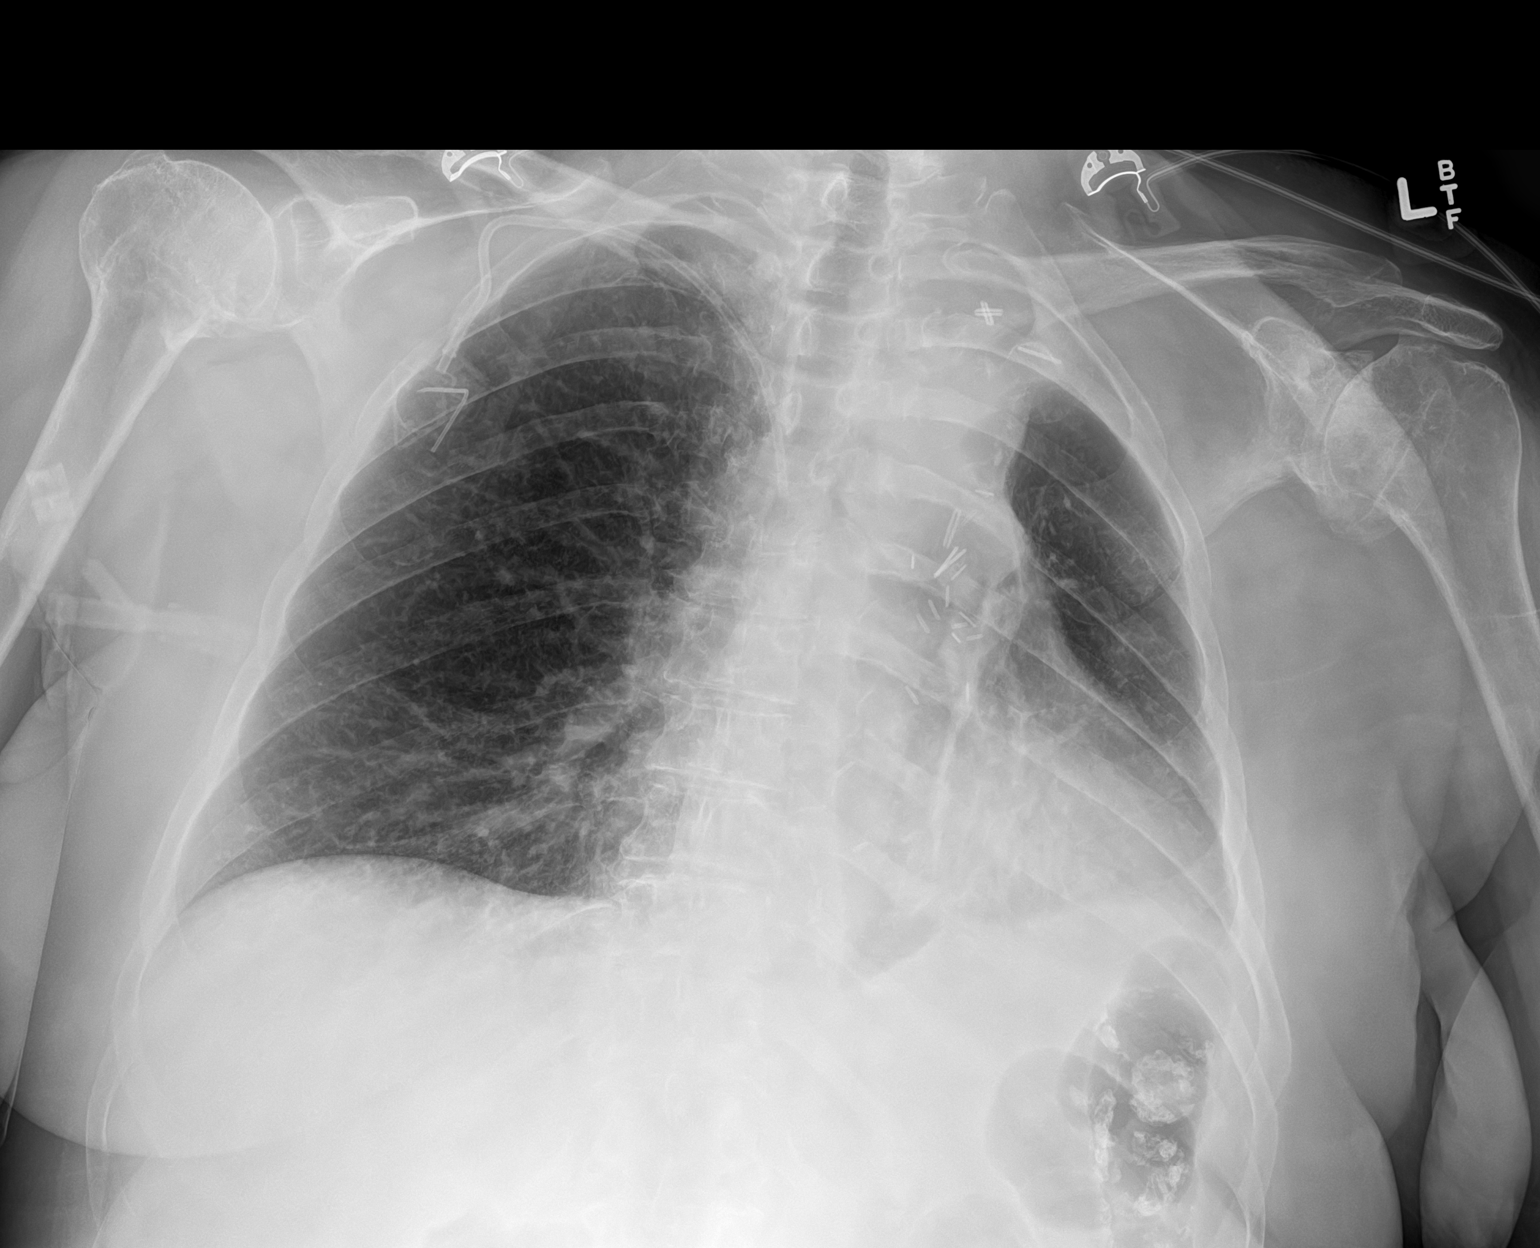

[1 of 1 positions shown; findings below may reference images not displayed]

FINDINGS: New right chest wall port catheter with tip in the mid SVC. The
heart size and mediastinal contours are within normal limits. Normal
pulmonary vascularity. Postsurgical changes and volume loss in the
left hemithorax. Unchanged trace left pleural effusion. No
consolidation or pneumothorax. No acute osseous abnormality.
IMPRESSION: 1. New right chest wall port catheter without complicating feature.
2. Postsurgical changes in the left lung. Unchanged trace left
pleural effusion.

## 2021-12-15 IMAGING — CT CT CHEST-ABD-PELV W/ CM
2 of 5 series · 11 of 36 positions shown, 12 images · IV contrast (omnipaque)
Comparison: CT of the chest, abdomen and pelvis 09/02/2019.

CLINICAL DATA: 69-year-old female with history of non-small cell
lung cancer with metastatic disease to the liver and brain. Status
post chemotherapy and radiation therapy. Evaluate for response to
treatment.

EXAM:
CT CHEST, ABDOMEN, AND PELVIS WITH CONTRAST
TECHNIQUE: Multidetector CT imaging of the chest, abdomen and pelvis was
performed following the standard protocol during bolus
administration of intravenous contrast.
CONTRAST:  100mL OMNIPAQUE IOHEXOL 300 MG/ML  SOLN

[Series 2: cap with · axial · 0.81mm/px · z∈[+736,+1261]mm · 8 of 129 slices shown, 9 images]
[im 12/129  mediastinal]
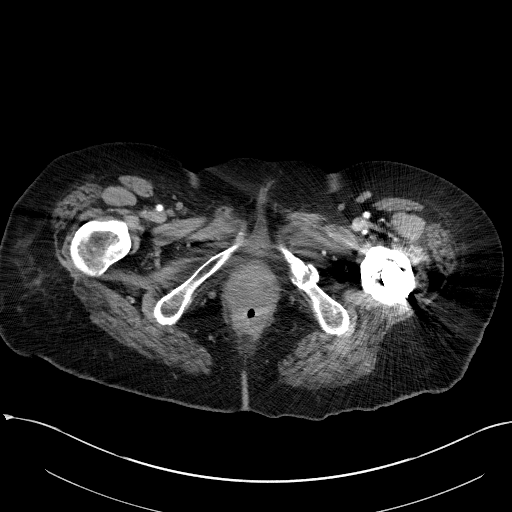
[im 12/129  bone]
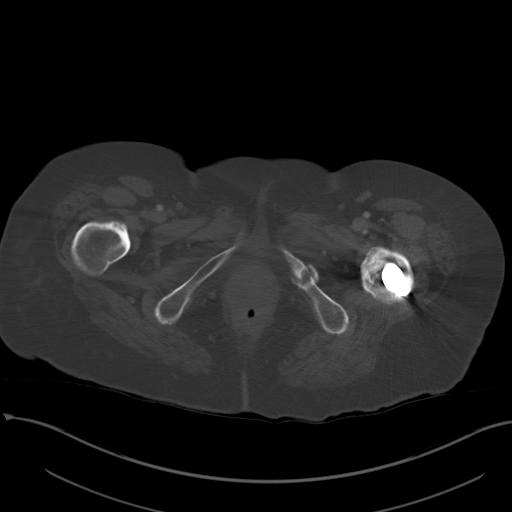
[im 24/129  mediastinal]
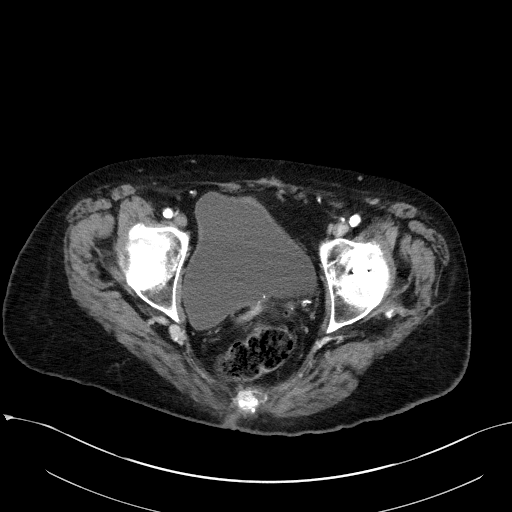
[im 47/129  mediastinal]
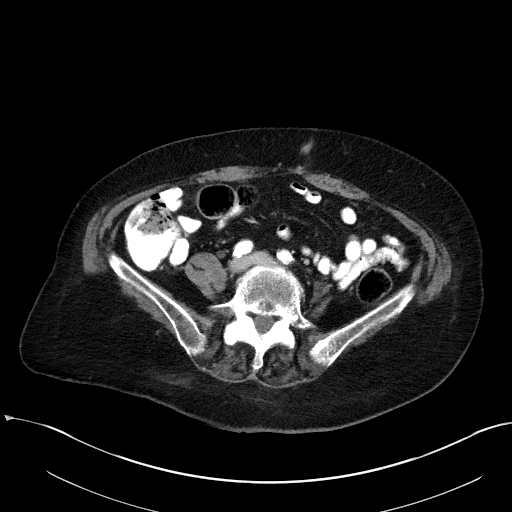
[im 59/129  mediastinal]
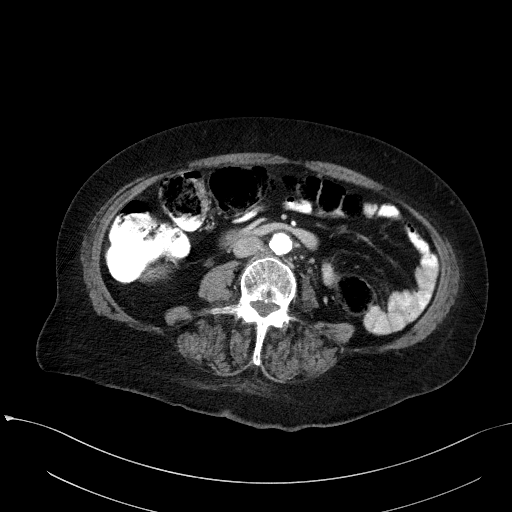
[im 70/129  mediastinal]
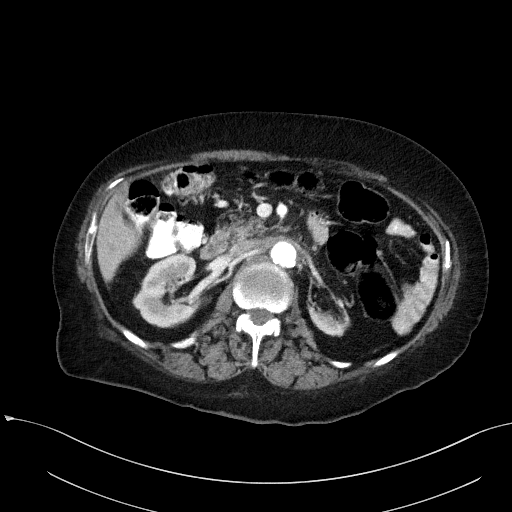
[im 82/129  mediastinal]
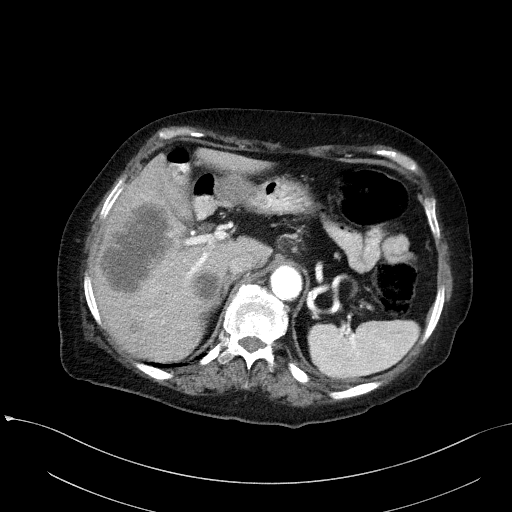
[im 105/129  mediastinal]
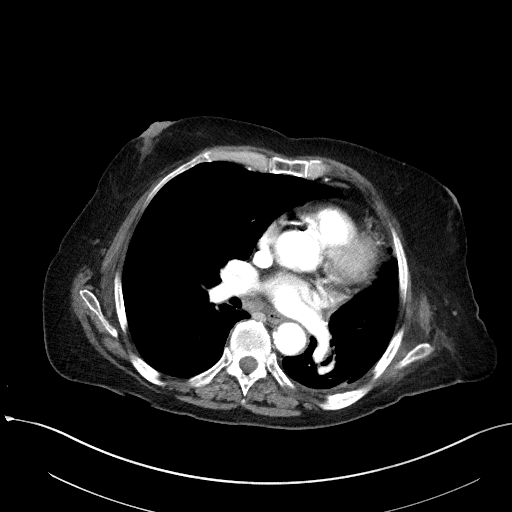
[im 117/129  mediastinal]
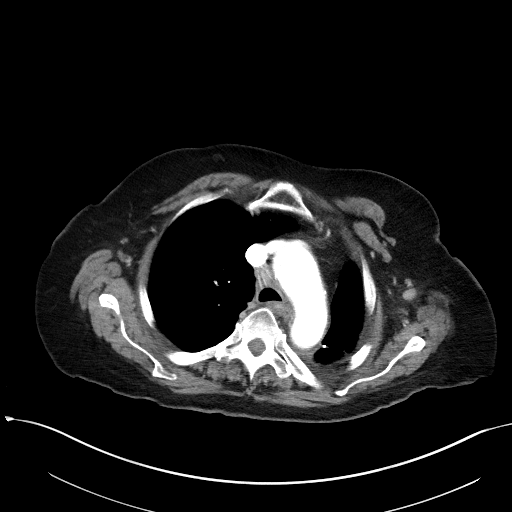

[Series 5: coronals · coronal · 0.71mm/px · 3 of 137 slices shown]
[im 28/137  mediastinal]
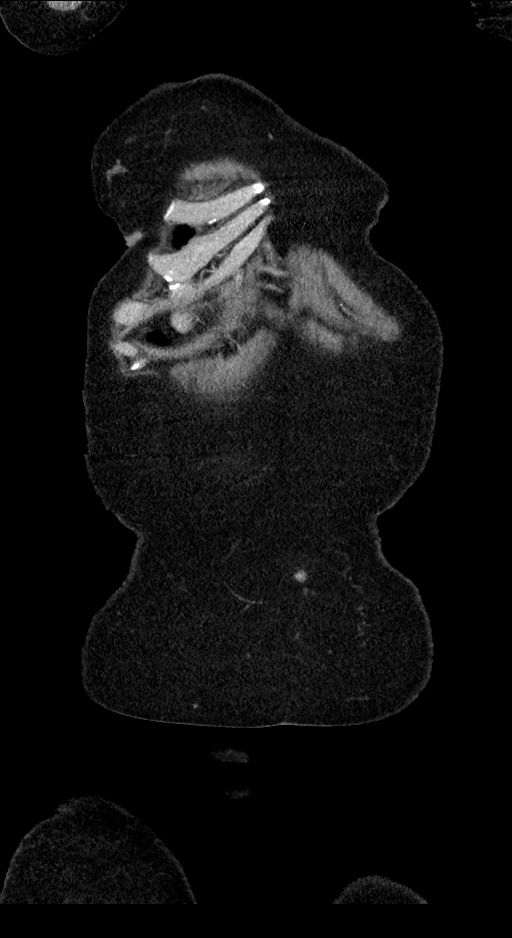
[im 55/137  mediastinal]
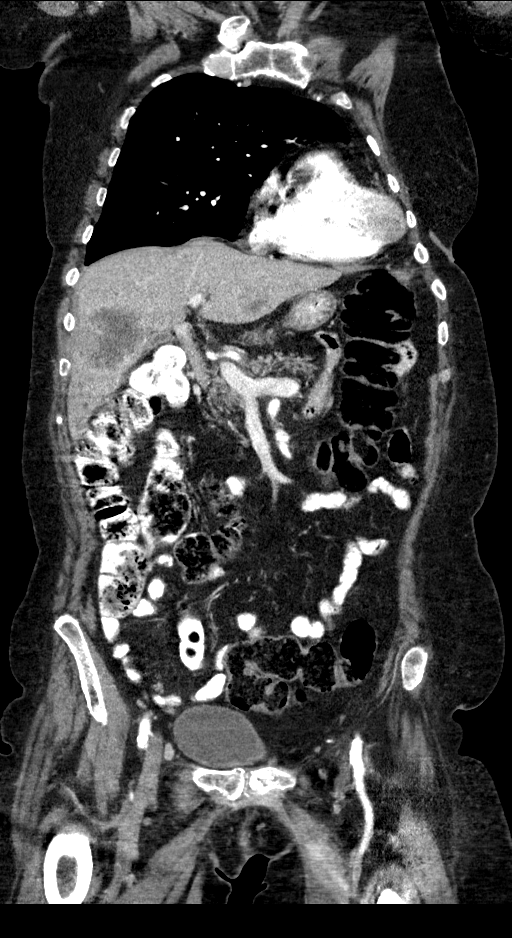
[im 82/137  mediastinal]
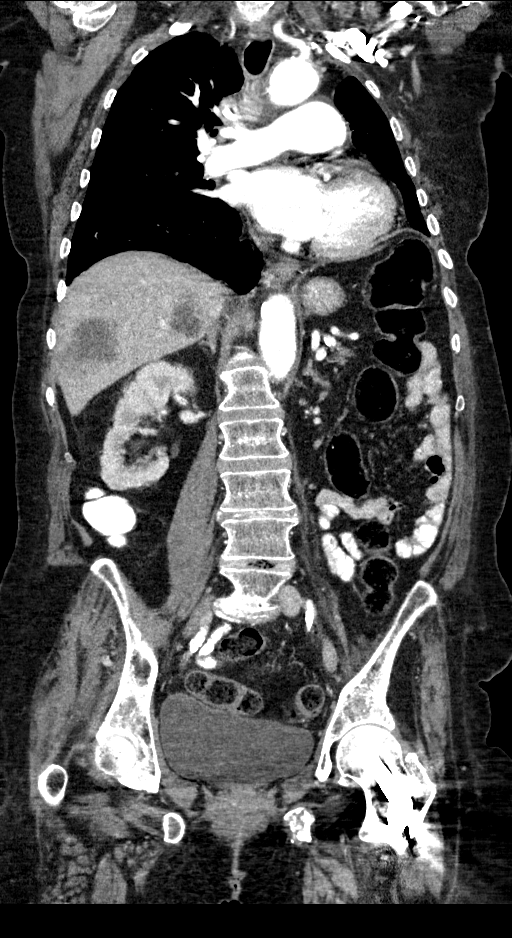

[11 of 36 positions shown; findings below may reference images not displayed]

FINDINGS: CT CHEST FINDINGS

Cardiovascular: Heart size is normal. There is no significant
pericardial fluid, thickening or pericardial calcification. There is
aortic atherosclerosis, as well as atherosclerosis of the great
vessels of the mediastinum and the coronary arteries, including
calcified atherosclerotic plaque in the left main, left anterior
descending and right coronary arteries. Dilatation of the pulmonic
trunk (3.6 cm in diameter). Right subclavian single-lumen porta cath
with tip terminating in the mid superior vena cava.

Mediastinum/Nodes: Prominent borderline enlarged low right
paratracheal lymph node measuring 1.3 cm in short axis, increased
slightly compared to the prior study (previously 11 mm in short
axis). No other definite mediastinal or hilar lymphadenopathy.
Esophagus is unremarkable in appearance. No axillary
lymphadenopathy.

Lungs/Pleura: Status post left upper lobectomy. Compensatory
hyperexpansion of the left lower lobe. Previously noted mass-like
area in the left lower lobe has decreased in prominence compared to
the prior study, now an ill-defined region of thickening of the
peribronchovascular interstitium, volume loss and regional
architectural distortion. This is favored to represent a treated
metastatic lesion with some regression, although resolving area of
infection is difficult to entirely exclude. Small chronic left
pleural effusion with chronic pleural enhancement is similar to
prior examinations. 3 mm pulmonary nodule in the medial aspect of
the right lower lobe (axial image 101 of series 4), stable compared
to the prior study. A few other small 2-3 mm pulmonary nodules
elsewhere in the right lung are also similar to the prior
examination. Diffuse bronchial wall thickening with mild
centrilobular and paraseptal emphysema.

Musculoskeletal: There are no aggressive appearing lytic or blastic
lesions noted in the visualized portions of the skeleton.

CT ABDOMEN PELVIS FINDINGS

Hepatobiliary: Increased number and size of numerous hypovascular
heterogeneously enhancing lesions in the liver, indicative of
progressive metastatic disease. The largest of these lesions is a
large mass centered in the right lobe of the liver (axial image 51
of series 2 and coronal image 65 of series 5) measuring 9.3 x 6.3 x
7.2 cm (previously 5.6 x 4.6 cm). Several new lesions are also
noted, largest of which is in the left lobe of the liver in segment
3 (axial image 45 of series 2) measuring 2.2 x 1.8 cm. No intra or
extrahepatic biliary ductal dilatation. Gallbladder is nearly
completely decompressed, but otherwise unremarkable in appearance.

Pancreas: No definite pancreatic mass. No pancreatic ductal
dilatation. No peripancreatic fluid collections. Haziness in the
soft tissues adjacent to the superior aspect of the head and
uncinate process of the pancreas (discussed below).

Spleen: Unremarkable.

Adrenals/Urinary Tract: Severe atrophy of the left kidney. Multiple
subcentimeter low-attenuation lesions in the right kidney, too small
to characterize, but similar to prior studies and statistically
likely to represent small cysts. Bilateral adrenal glands are normal
in appearance. No hydroureteronephrosis. Urinary bladder is normal
in appearance.

Stomach/Bowel: Normal appearance of the stomach. No pathologic
dilatation of small bowel or colon. The appendix is not confidently
identified and may be surgically absent. Regardless, there are no
inflammatory changes noted adjacent to the cecum to suggest the
presence of an acute appendicitis at this time.

Vascular/Lymphatic: Aortic atherosclerosis, without evidence of
aneurysm or dissection in the abdominal or pelvic vasculature.
Enlarging hepatic duodenal ligament lymph node measuring 2.1 x
cm on today's examination (axial image 54 of series 2). Ill-defined
soft tissue in the retroperitoneum adjacent to the abdominal aorta
and inferior vena cava, best appreciated on axial image 59 of series
2, as well as more superiorly adjacent to the pancreatic head and
uncinate process on axial image 55 of series 2.

Reproductive: Status post hysterectomy. Ovaries are not confidently
identified may be surgically absent or atrophic.

Other: No significant volume of ascites.  No pneumoperitoneum.

Musculoskeletal: Areas of lucency and sclerosis in the left
hemipelvis involving the left superior and inferior pubic ramus as
well as the parasymphyseal region of the left pubic bone, in
addition to the left sacral ala. Healing pathologic fractures are
noted in the left parasymphyseal region and the left inferior pubic
ramus, as well as in the left sacral ala. Status post ORIF in the
left proximal femur.
IMPRESSION: 1. Progressive metastatic disease to the liver, as above. Worsening
upper abdominal and retroperitoneal lymphadenopathy, indicative of
progressive metastatic disease.
2. Previously noted left lower lobe lesion demonstrates some
regression, which could be indicative of a positive response to
therapy of this particular lesion, or could be indicative of a
resolving infectious process.
3. Healing fractures in the left hemipelvis, favored to represent
healing pathologic fractures, as above.
4. Dilatation of the pulmonic trunk (3.6 cm in diameter), concerning
for pulmonary arterial hypertension.
5. Aortic atherosclerosis, in addition to left main and 2 vessel
coronary artery disease. Assessment for potential risk factor
modification, dietary therapy or pharmacologic therapy may be
warranted, if clinically indicated.
6. Additional incidental findings, as above.
